# Patient Record
Sex: Male | Born: 1960
Health system: Southern US, Community
[De-identification: ages and names within clinical notes are randomized; demographics above are authoritative.]

## PROBLEM LIST (undated history)

## (undated) DIAGNOSIS — I4819 Other persistent atrial fibrillation: Secondary | ICD-10-CM

## (undated) DIAGNOSIS — C439 Malignant melanoma of skin, unspecified: Secondary | ICD-10-CM

## (undated) DIAGNOSIS — I4891 Unspecified atrial fibrillation: Secondary | ICD-10-CM

## (undated) DIAGNOSIS — I4892 Unspecified atrial flutter: Secondary | ICD-10-CM

## (undated) DIAGNOSIS — I10 Essential (primary) hypertension: Secondary | ICD-10-CM

## (undated) DIAGNOSIS — D229 Melanocytic nevi, unspecified: Secondary | ICD-10-CM

## (undated) HISTORY — PX: KNEE ARTHROSCOPY: SHX127

## (undated) HISTORY — DX: Other persistent atrial fibrillation: I48.19

---
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
## 2006-05-11 ENCOUNTER — Ambulatory Visit (HOSPITAL_BASED_OUTPATIENT_CLINIC_OR_DEPARTMENT_OTHER): Admission: RE | Admit: 2006-05-11 | Discharge: 2006-05-11 | Payer: Self-pay | Admitting: Orthopedic Surgery

## 2011-09-29 ENCOUNTER — Other Ambulatory Visit: Payer: Self-pay

## 2011-09-29 ENCOUNTER — Encounter (HOSPITAL_COMMUNITY): Payer: Self-pay | Admitting: Emergency Medicine

## 2011-09-29 ENCOUNTER — Emergency Department (HOSPITAL_COMMUNITY): Payer: BC Managed Care – PPO

## 2011-09-29 ENCOUNTER — Emergency Department (HOSPITAL_COMMUNITY)
Admission: EM | Admit: 2011-09-29 | Discharge: 2011-09-29 | Disposition: A | Discharge status: Home / Self Care | Payer: BC Managed Care – PPO | Attending: Emergency Medicine | Admitting: Emergency Medicine

## 2011-09-29 DIAGNOSIS — R0602 Shortness of breath: Secondary | ICD-10-CM | POA: Insufficient documentation

## 2011-09-29 DIAGNOSIS — R079 Chest pain, unspecified: Secondary | ICD-10-CM | POA: Insufficient documentation

## 2011-09-29 HISTORY — DX: Malignant melanoma of skin, unspecified: C43.9

## 2011-09-29 LAB — BASIC METABOLIC PANEL
BUN: 12 mg/dL (ref 6–23)
CO2: 26 mEq/L (ref 19–32)
Calcium: 9.4 mg/dL (ref 8.4–10.5)
Chloride: 104 mEq/L (ref 96–112)
Creatinine, Ser: 1.09 mg/dL (ref 0.50–1.35)
Glucose, Bld: 107 mg/dL — ABNORMAL HIGH (ref 70–99)

## 2011-09-29 LAB — CBC
HCT: 41.7 % (ref 39.0–52.0)
Hemoglobin: 14.4 g/dL (ref 13.0–17.0)
MCV: 87.8 fL (ref 78.0–100.0)
RDW: 12.7 % (ref 11.5–15.5)
WBC: 6.4 10*3/uL (ref 4.0–10.5)

## 2011-09-29 LAB — DIFFERENTIAL
Basophils Absolute: 0 10*3/uL (ref 0.0–0.1)
Eosinophils Relative: 2 % (ref 0–5)
Lymphocytes Relative: 34 % (ref 12–46)
Lymphs Abs: 2.2 10*3/uL (ref 0.7–4.0)
Monocytes Absolute: 0.8 10*3/uL (ref 0.1–1.0)
Monocytes Relative: 13 % — ABNORMAL HIGH (ref 3–12)

## 2011-09-29 MED ORDER — GI COCKTAIL ~~LOC~~
30.0000 mL | Freq: Once | ORAL | Status: AC
  Administered 2011-09-29: 30 mL via ORAL
  Filled 2011-09-29: qty 30

## 2011-09-29 MED ORDER — OMEPRAZOLE 20 MG PO CPDR: 40.0000 mg | DELAYED_RELEASE_CAPSULE | Freq: Every day | ORAL | Status: DC

## 2011-09-29 MED ORDER — ASPIRIN 81 MG PO CHEW
324.0000 mg | CHEWABLE_TABLET | Freq: Once | ORAL | Status: AC
  Administered 2011-09-29: 324 mg via ORAL
  Filled 2011-09-29: qty 4

## 2011-09-29 NOTE — ED Notes (Signed)
Pt states been having chest pain, shortness of breath, and nausea for about 2 months, the pain has been more constant the past 2 weeks, pt describes pain as episodes of sharp, stabbing pain with a constant pressure, with squeezing pain radiating to left arm. Wife called PMD at [facility] to schedule appointment for chest pain, RN at office instructed to come to ED.

## 2011-09-29 NOTE — ED Provider Notes (Signed)
History     CSN: [id number]  Arrival date & time 09/29/11  1257   First MD Initiated Contact with Patient 09/29/11 1311      Chief Complaint  Patient presents with  . Chest Pain  . Shortness of Breath    (Consider location/radiation/quality/duration/timing/severity/associated sxs/prior treatment) HPI  Patient presents to ER complaining of a one month hx of CP. Patient states that over the last 2 weeks he has been having daily constant pressure in the left side of chest that 'feels like there is a 5-10pound weight on my chest" that he states he goes to bed with pain and wakes with pain stating that the intensity of pressure sometimes increases and then wanes back to a constant dull pressure. Patient notes that intermittently he will have sharp stabbing pain into left chest that is "more intense" then constant pressure with associated nausea and SOB. "when I get the sharp pain it is like the breath is knocked out of me." Patient states he will take an aspirin when the pain becomes more severe but takes no medications on a regular basis. Patient states he called his PCP today to "finally get seen for this but when I told them I was having CP they told me I needed to go to ER." Patient states PCP is Dr. Steele Berg in [place], [place]. Patient states he has been told in the past that he has high cholesterol but that PCP has him on no daily medications. Patient states that he works out regularly at gym. He denies tobacco, alcohol or illicit drug use. Patient states that both his mother and father had early heart disease and that his father "ended up dying because he needed a heart transplant because his heart got so back and mother has had lots of stents and bypass surgery" noting that heart disease started in her early 55s. Patient denies aggravating or alleviating factors.   Past Medical History  Diagnosis Date  . Melanoma     Past Surgical History  Procedure Date  . Knee arthroscopy     History  reviewed. No pertinent family history.  History  Substance Use Topics  . Smoking status: Never Smoker   . Smokeless tobacco: Never Used  . Alcohol Use: No      Review of Systems  All other systems reviewed and are negative.    Allergies  Review of patient's allergies indicates no known allergies.  Home Medications   Current Outpatient Rx  Name Route Sig Dispense Refill  . IBUPROFEN 200 MG PO TABS Oral Take 600 mg by mouth every 6 (six) hours as needed. pain    . MULTIVITAMINS PO TABS Oral Take 1 tablet by mouth daily.      BP 150/96  Pulse 55  Temp(Src) 97.7 F (36.5 C) (Oral)  Resp 13  SpO2 97%  Physical Exam  Nursing note and vitals reviewed. Constitutional: He is oriented to person, place, and time. He appears well-developed and well-nourished. No distress.  HENT:  Head: Normocephalic and atraumatic.  Eyes: Conjunctivae are normal.  Neck: Normal range of motion. Neck supple.  Cardiovascular: Normal rate, regular rhythm, normal heart sounds and intact distal pulses.  Exam reveals no gallop and no friction rub.   No murmur heard. Pulmonary/Chest: Effort normal and breath sounds normal. No respiratory distress. He has no wheezes. He has no rales. He exhibits no tenderness.  Abdominal: Soft. Bowel sounds are normal. He exhibits no distension and no mass. There is no tenderness. There is  no rebound and no guarding.  Musculoskeletal: Normal range of motion. He exhibits no edema and no tenderness.  Neurological: He is alert and oriented to person, place, and time.  Skin: Skin is warm and dry. No rash noted. He is not diaphoretic. No erythema.  Psychiatric: He has a normal mood and affect.    ED Course  Procedures (including critical care time)  GI cocktail and PO ASA   Date: 09/29/2011  Rate: 54  Rhythm: normal sinus rhythm  QRS Axis: normal  Intervals: normal  ST/T Wave abnormalities: normal  Conduction Disutrbances: none  Narrative Interpretation: non  provocative EKG  Old EKG Reviewed: compared to prior, No significant changes noted   Labs Reviewed  DIFFERENTIAL - Abnormal; Notable for the following:    Monocytes Relative 13 (*)    All other components within normal limits  BASIC METABOLIC PANEL - Abnormal; Notable for the following:    Glucose, Bld 107 (*)    GFR calc non Af Amer 77 (*)    GFR calc Af Amer 89 (*)    All other components within normal limits  CBC  D-DIMER, QUANTITATIVE  POCT I-STAT TROPONIN I  TROPONIN I   Dg Chest 2 View  09/29/2011  *RADIOLOGY REPORT*  Clinical Data: Chest pain and pressure.  CHEST - 2 VIEW  Comparison: None.  Findings: The lungs are clear without focal infiltrate, edema, pneumothorax or pleural effusion. Cardiopericardial silhouette is at upper limits of normal for size. Imaged bony structures of the thorax are intact.  IMPRESSION: No acute cardiopulmonary findings.  Original Report Authenticated By: ERIC A. MANSELL, M.D.     1. Chest pain       MDM  Patient's constant pain/pressure in chest x 2 weeks is not typical for ACS with negative troponin and EKG with constant pain. Low risk PE and ddimer negative. Patient does have some family hx risk of early CAD but carries very little personal risk for CAD. He is agreeable to following up with PCP and cardiology for further evaluation and management of CP but returning to ER for changing or worsening of symptoms.         Jenness Corner, [place] 09/29/11 1709

## 2011-09-29 NOTE — ED Provider Notes (Signed)
Medical screening examination/treatment/procedure(s) were conducted as a shared visit with non-physician practitioner(s) and myself.  I personally evaluated the patient during the encounter  Constant chest "pressure" x 1 month that varies in intensity but is always somewhat present.  Occasional sharp stabbing pain that last about 1 minute and is associate with SOB.  No cardiac history.  Pain is worse with L arm movement. EKG nonischemic. Troponin and Ddimer neg. Atypical for angina. Stable for outpatient stress.  Glynn Octave, MD 09/29/11 2000

## 2011-10-13 DIAGNOSIS — I10 Essential (primary) hypertension: Secondary | ICD-10-CM | POA: Insufficient documentation

## 2015-04-03 DIAGNOSIS — R7303 Prediabetes: Secondary | ICD-10-CM | POA: Insufficient documentation

## 2016-04-16 DIAGNOSIS — D229 Melanocytic nevi, unspecified: Secondary | ICD-10-CM

## 2016-04-16 HISTORY — DX: Melanocytic nevi, unspecified: D22.9

## 2017-01-16 ENCOUNTER — Encounter (INDEPENDENT_AMBULATORY_CARE_PROVIDER_SITE_OTHER): Payer: 59 | Admitting: Ophthalmology

## 2017-01-16 DIAGNOSIS — H353112 Nonexudative age-related macular degeneration, right eye, intermediate dry stage: Secondary | ICD-10-CM

## 2017-01-16 DIAGNOSIS — H35033 Hypertensive retinopathy, bilateral: Secondary | ICD-10-CM

## 2017-01-16 DIAGNOSIS — I10 Essential (primary) hypertension: Secondary | ICD-10-CM | POA: Diagnosis not present

## 2017-01-16 DIAGNOSIS — H43813 Vitreous degeneration, bilateral: Secondary | ICD-10-CM | POA: Diagnosis not present

## 2017-01-16 DIAGNOSIS — D3132 Benign neoplasm of left choroid: Secondary | ICD-10-CM | POA: Diagnosis not present

## 2017-01-27 ENCOUNTER — Emergency Department (HOSPITAL_COMMUNITY): Payer: 59

## 2017-01-27 ENCOUNTER — Emergency Department (HOSPITAL_COMMUNITY)
Admission: EM | Admit: 2017-01-27 | Discharge: 2017-01-27 | Disposition: A | Discharge status: Home / Self Care | Payer: 59 | Attending: Emergency Medicine | Admitting: Emergency Medicine

## 2017-01-27 ENCOUNTER — Encounter (HOSPITAL_COMMUNITY): Payer: Self-pay | Admitting: Emergency Medicine

## 2017-01-27 DIAGNOSIS — Z85820 Personal history of malignant melanoma of skin: Secondary | ICD-10-CM | POA: Insufficient documentation

## 2017-01-27 DIAGNOSIS — Z79899 Other long term (current) drug therapy: Secondary | ICD-10-CM | POA: Diagnosis not present

## 2017-01-27 DIAGNOSIS — R0602 Shortness of breath: Secondary | ICD-10-CM | POA: Diagnosis not present

## 2017-01-27 DIAGNOSIS — R072 Precordial pain: Secondary | ICD-10-CM | POA: Diagnosis not present

## 2017-01-27 LAB — BRAIN NATRIURETIC PEPTIDE: B Natriuretic Peptide: 18.2 pg/mL (ref 0.0–100.0)

## 2017-01-27 LAB — I-STAT TROPONIN, ED
TROPONIN I, POC: 0.02 ng/mL (ref 0.00–0.08)
Troponin i, poc: 0.02 ng/mL (ref 0.00–0.08)

## 2017-01-27 LAB — BASIC METABOLIC PANEL
ANION GAP: 6 (ref 5–15)
BUN: 12 mg/dL (ref 6–20)
CO2: 26 mmol/L (ref 22–32)
Calcium: 9 mg/dL (ref 8.9–10.3)
Chloride: 105 mmol/L (ref 101–111)
Creatinine, Ser: 1.23 mg/dL (ref 0.61–1.24)
GFR calc Af Amer: >60 mL/min (ref >60)
GLUCOSE: 130 mg/dL — AB (ref 65–99)
POTASSIUM: 4 mmol/L (ref 3.5–5.1)
Sodium: 137 mmol/L (ref 135–145)

## 2017-01-27 LAB — CBC
HEMATOCRIT: 50.4 % (ref 39.0–52.0)
HEMOGLOBIN: 17.2 g/dL — AB (ref 13.0–17.0)
MCH: 31.4 pg (ref 26.0–34.0)
MCHC: 34.1 g/dL (ref 30.0–36.0)
MCV: 92 fL (ref 78.0–100.0)
Platelets: 232 10*3/uL (ref 150–400)
RBC: 5.48 MIL/uL (ref 4.22–5.81)
RDW: 13.4 % (ref 11.5–15.5)
WBC: 7.2 10*3/uL (ref 4.0–10.5)

## 2017-01-27 LAB — D-DIMER, QUANTITATIVE (NOT AT ARMC)

## 2017-01-27 MED ORDER — IPRATROPIUM-ALBUTEROL 0.5-2.5 (3) MG/3ML IN SOLN
3.0000 mL | Freq: Once | RESPIRATORY_TRACT | Status: AC
  Administered 2017-01-27: 3 mL via RESPIRATORY_TRACT
  Filled 2017-01-27: qty 3

## 2017-01-27 MED ORDER — ACETAMINOPHEN 500 MG PO TABS
1000.0000 mg | ORAL_TABLET | Freq: Once | ORAL | Status: AC
  Administered 2017-01-27: 1000 mg via ORAL
  Filled 2017-01-27: qty 2

## 2017-01-27 MED ORDER — ALBUTEROL SULFATE HFA 108 (90 BASE) MCG/ACT IN AERS
2.0000 | INHALATION_SPRAY | Freq: Once | RESPIRATORY_TRACT | Status: AC
  Administered 2017-01-27: 2 via RESPIRATORY_TRACT
  Filled 2017-01-27: qty 6.7

## 2017-01-27 MED ORDER — GI COCKTAIL ~~LOC~~
30.0000 mL | Freq: Once | ORAL | Status: AC
  Administered 2017-01-27: 30 mL via ORAL
  Filled 2017-01-27: qty 30

## 2017-01-27 MED ORDER — ASPIRIN 81 MG PO CHEW
81.0000 mg | CHEWABLE_TABLET | Freq: Once | ORAL | Status: AC
  Administered 2017-01-27: 81 mg via ORAL
  Filled 2017-01-27: qty 1

## 2017-01-27 NOTE — ED Provider Notes (Signed)
[facility] DEPT Provider Note   CSN: [id number] Arrival date & time: 01/27/17  1218     History   Chief Complaint Chief Complaint  Patient presents with  . Shortness of Breath  . Chest Pain    HPI Stephanos Fan is a 56 y.o. male with history of hypertension presents to the ED with chest heaviness and shortness of breath on exertion 2 weeks. Intermittently CP radiates to middle upper back. Shortness of breath has now progressed and is also occurring at rest. He was sitting in class today when he felt winded and SOB. Shortness of breath is exacerbated by laying flat on back and sleeping, patient has had to sit up on chair or sleep on his side. Occasional, intermittent nonproductive cough. Patient has taken nitroglycerin for chest discomfort and shortness of breath with temporary relief of his symptoms. He was initially evaluated at urgent care 2-3 weeks ago who referred him to cardiology, patient had a stress test and echocardiogram done last week by Dr. Nadyne Coombes. Patient reports Dr. Nadyne Coombes told him he is mild/moderate risk and that his stress test "wasn't great". Patient does not know about his echocardiogram results. No known h/o CAD, PE/DVT. No personal h/o DM, HLD, tobacco use, illicit drug use, young onset family CAD.   HPI  Past Medical History:  Diagnosis Date  . Melanoma ([place])     There are no active problems to display for this patient.   Past Surgical History:  Procedure Laterality Date  . KNEE ARTHROSCOPY         Home Medications    Prior to Admission medications   Medication Sig Start Date End Date Taking? Authorizing Provider  amLODipine (NORVASC) 5 MG tablet Take 5 mg by mouth daily. 12/31/16  Yes [provider]  ibuprofen (ADVIL,MOTRIN) 200 MG tablet Take 600 mg by mouth every 6 (six) hours as needed for mild pain. pain    Yes [provider]  losartan (COZAAR) 50 MG tablet Take 50 mg by mouth daily. 01/20/17  Yes [provider]    multivitamin (THERAGRAN) per tablet Take 1 tablet by mouth daily.   Yes [provider]  nitroGLYCERIN (NITROSTAT) 0.4 MG SL tablet Place 0.4 mg under the tongue every 5 (five) minutes as needed for chest pain. 01/23/17  Yes [provider]  testosterone cypionate (DEPOTESTOSTERONE CYPIONATE) 200 MG/ML injection Inject 3 mLs into the muscle every 14 (fourteen) days. 01/07/17  Yes [provider]    Family History No family history on file.  Social History Social History  Substance Use Topics  . Smoking status: Never Smoker  . Smokeless tobacco: Never Used  . Alcohol use No     Allergies   Doxycycline and Tetracycline   Review of Systems Review of Systems  Constitutional: Negative for diaphoresis and fever.  HENT: Negative for sore throat.   Respiratory: Positive for cough, chest tightness and shortness of breath.   Cardiovascular: Positive for chest pain. Negative for palpitations.  Gastrointestinal: Negative for abdominal pain, constipation, diarrhea, nausea and vomiting.  Genitourinary: Negative for difficulty urinating, dysuria, frequency and hematuria.  Musculoskeletal: Positive for back pain. Negative for myalgias.  Skin: Negative for rash.  Neurological: Negative for syncope, weakness, light-headedness, numbness and headaches.     Physical Exam Updated Vital Signs BP (!) 156/87   Pulse 78   Temp 98.2 F (36.8 C) (Oral)   Resp 15   Ht 6' (1.829 m)   Wt 111.1 kg (245 lb)   SpO2  95%   BMI 33.23 kg/m   Physical Exam  Constitutional: He is oriented to person, place, and time. He appears well-developed and well-nourished. No distress.  HENT:  Head: Normocephalic and atraumatic.  Nose: Nose normal.  Mouth/Throat: Oropharynx is clear and moist. No oropharyngeal exudate.  Eyes: Conjunctivae and EOM are normal. Pupils are equal, round, and reactive to light.  Neck: Normal range of motion. Neck supple.  Cardiovascular: Normal rate, regular  rhythm, normal heart sounds and intact distal pulses.   No murmur heard. No S3. No JVD. No LE edema.   Pulmonary/Chest: Effort normal and breath sounds normal. No respiratory distress. He has no wheezes. He has no rales. He exhibits no tenderness.  Abdominal: Soft. Bowel sounds are normal. He exhibits no distension. There is no tenderness.  Musculoskeletal: Normal range of motion. He exhibits no deformity.  Lymphadenopathy:    He has no cervical adenopathy.  Neurological: He is alert and oriented to person, place, and time.  Skin: Skin is warm and dry. Capillary refill takes less than 2 seconds.  Psychiatric: He has a normal mood and affect. His behavior is normal. Judgment and thought content normal.  Nursing note and vitals reviewed.    ED Treatments / Results  Labs (all labs ordered are listed, but only abnormal results are displayed) Labs Reviewed  BASIC METABOLIC PANEL - Abnormal; Notable for the following:       Result Value   Glucose, Bld 130 (*)    All other components within normal limits  CBC - Abnormal; Notable for the following:    Hemoglobin 17.2 (*)    All other components within normal limits  BRAIN NATRIURETIC PEPTIDE  D-DIMER, QUANTITATIVE (NOT AT [facility])  I-STAT TROPOININ, ED    EKG  EKG Interpretation  Date/Time:  Tuesday January 27 2017 12:24:32 EDT Ventricular Rate:  73 PR Interval:  146 QRS Duration: 92 QT Interval:  390 QTC Calculation: 429 R Axis:   52 Text Interpretation:  Normal sinus rhythm Normal ECG Confirmed by Veryl Speak [phone]) on 01/27/2017 2:37:49 PM       Radiology Dg Chest 2 View  Result Date: 01/27/2017 CLINICAL DATA:  Chest pain, shortness of breath for several weeks, abnormal stress test EXAM: CHEST  2 VIEW COMPARISON:  Chest x-ray of 09/29/2011 FINDINGS: No active infiltrate or effusion is seen. Mediastinal and hilar contours are unremarkable and heart size is stable. No bony abnormality is seen. IMPRESSION: No active  cardiopulmonary disease. Electronically Signed   By: Ivar Drape M.D.   On: 01/27/2017 12:50    Procedures Procedures (including critical care time)  Medications Ordered in ED Medications  gi cocktail (Maalox,Lidocaine,Donnatal) (not administered)  acetaminophen (TYLENOL) tablet 1,000 mg (not administered)  aspirin chewable tablet 81 mg (not administered)  ipratropium-albuterol (DUONEB) 0.5-2.5 (3) MG/3ML nebulizer solution 3 mL (3 mLs Nebulization Given 01/27/17 1548)     Initial Impression / Assessment and Plan / ED Course  I have reviewed the triage vital signs and the nursing notes.  Pertinent labs & imaging results that were available during my care of the patient were reviewed by me and considered in my medical decision making (see chart for details).    Pt is a 56 y.o. male presents with chest tightness associated with SOB. SOB worse on exertion and laying flat.  Chest tightness now occurs on exertion but now also at rest.  Pertinent risk factors include HTN and obesity.  On exam VS are wnl. Cardiovascular and pulmonary exam benign.  No LE edema, no S3. CXR with no significant cardiomegaly or pulmonary edema.  EKG and trop negative.  CBC and BMP unremarkable.  Heart score = 3-4 with obesity, HTN and mod/high suspicious history.  Pt was given duoneb, GI cocktail, tylenol in ED. Cardiologist is Dr. Nadyne Coombes, who saw patient last week and did a stress test and echocardiogram. Patient states his stress test was "not great" but unsure of echocardiogram results. Cannot find documentation of these tests on chart.   Symptoms suspicious for MSK vs unstable angina. Considering but low suspicion for PE and dissection. Will page Dr. Nadyne Coombes for recommendations.  Final Clinical Impressions(s) / ED Diagnoses   Final diagnoses:  Shortness of breath  Precordial pain   345PM: Spoke to Dr. Nadyne Coombes. He reviewed patient's chart and stress test done on 01/24/17. Stress test was normal. Patient has minimal risk  factors per Dr. Nadyne Coombes  Who suspect MSK etiology given non ischemic EKG and troponin today.  He recommends GI cocktail, tylenol, delta trop and r/o PE.   Patient handed off to oncoming PA-C Dansie at shift change pending BNP, d-dimer, delta trop and reassessment. Suspect discharge with f/u with Dr. Nadyne Coombes later this week.   New Prescriptions New Prescriptions   No medications on file       Arlean Hopping 01/27/17 1558    Veryl Speak, MD 01/28/17 [phone]

## 2017-01-27 NOTE — ED Notes (Signed)
Patient states he has been seeing cardiology for several weeks for chest pain states he had a stress test on Fri and "failed" scheduled for cardiolyte study July 30th. States he was having chest heaviness with back pain and sob today while at work. Took 1 ntg with relief. However 15-20 minutes later started feeling worse. States the pain and sob is worse laying down. States he feels better sitting up.

## 2017-01-27 NOTE — ED Triage Notes (Signed)
Pt. Stated, I started having chest pain with SOB for a few weeks, I failed the stress test. This episodes started this morning.

## 2017-01-27 NOTE — ED Notes (Signed)
Patient left at this time with all belongings. 

## 2017-01-27 NOTE — ED Provider Notes (Signed)
Patient care assumed from Carmon Sails, PA-C at shift change. Please see her note for further.  Briefly, patient presented with chest pain and shortness of breath with chest heaviness intermittently for 2 weeks. Patient's initial blood work was reassuring. Initial troponin is not elevated. Plan at shift change was for BNP, d-dimer and delta troponin. Previous provider consult with cardiologist Dr. Einar Gip who reports if patient's BNP, d-dimer and delta troponin are not elevated patient can likely be discharged with follow-up in his office. He had a recent cardiac stress test that was 'ot great.' but he did not feel the patient needed admission and could follow up as an outpatient.  Patient's delta troponin is not elevated. BNP and d-dimer are not elevated. At my evaluation patient reports he is feeling better. He did report feeling better after breathing treatment and aspirin. I will provide him with an albuterol inhaler in case this is related to some wheezing. I encouraged him to follow closely with cardiology this week. Patient agrees. Discussed strict and specific return precautions. I advised the patient to follow-up with their primary care provider this week. I advised the patient to return to the emergency department with new or worsening symptoms or new concerns. The patient verbalized understanding and agreement with plan.   Results for orders placed or performed during the hospital encounter of 83/38/25  Basic metabolic panel  Result Value Ref Range   Sodium 137 135 - 145 mmol/L   Potassium 4.0 3.5 - 5.1 mmol/L   Chloride 105 101 - 111 mmol/L   CO2 26 22 - 32 mmol/L   Glucose, Bld 130 (H) 65 - 99 mg/dL   BUN 12 6 - 20 mg/dL   Creatinine, Ser 1.23 0.61 - 1.24 mg/dL   Calcium 9.0 8.9 - 10.3 mg/dL   GFR calc non Af Amer >60 >60 mL/min   GFR calc Af Amer >60 >60 mL/min   Anion gap 6 5 - 15  CBC  Result Value Ref Range   WBC 7.2 4.0 - 10.5 K/uL   RBC 5.48 4.22 - 5.81 MIL/uL   Hemoglobin  17.2 (H) 13.0 - 17.0 g/dL   HCT 50.4 39.0 - 52.0 %   MCV 92.0 78.0 - 100.0 fL   MCH 31.4 26.0 - 34.0 pg   MCHC 34.1 30.0 - 36.0 g/dL   RDW 13.4 11.5 - 15.5 %   Platelets 232 150 - 400 K/uL  Brain natriuretic peptide  Result Value Ref Range   B Natriuretic Peptide 18.2 0.0 - 100.0 pg/mL  D-dimer, quantitative (not at [facility])  Result Value Ref Range   D-Dimer, Quant <0.27 0.00 - 0.50 ug/mL-FEU  I-stat troponin, ED  Result Value Ref Range   Troponin i, poc 0.02 0.00 - 0.08 ng/mL   Comment 3          I-stat troponin, ED  Result Value Ref Range   Troponin i, poc 0.02 0.00 - 0.08 ng/mL   Comment 3           Dg Chest 2 View  Result Date: 01/27/2017 CLINICAL DATA:  Chest pain, shortness of breath for several weeks, abnormal stress test EXAM: CHEST  2 VIEW COMPARISON:  Chest x-ray of 09/29/2011 FINDINGS: No active infiltrate or effusion is seen. Mediastinal and hilar contours are unremarkable and heart size is stable. No bony abnormality is seen. IMPRESSION: No active cardiopulmonary disease. Electronically Signed   By: Ivar Drape M.D.   On: 01/27/2017 12:50    Shortness of breath  Precordial pain     Waynetta Pean, PA-C 01/27/17 2001    Veryl Speak, MD 01/28/17 [phone]

## 2017-07-17 ENCOUNTER — Telehealth: Payer: Self-pay | Admitting: Hematology and Oncology

## 2017-07-17 NOTE — Telephone Encounter (Signed)
Spoke with patient re 1/18 new patient appointment with Dr. Lebron Conners at 1pm. Patient given appointment date/time/location. Demographic/insurance confirmed. Left message for referring office confirming appointment date/time.

## 2017-07-29 DIAGNOSIS — L03213 Periorbital cellulitis: Secondary | ICD-10-CM | POA: Diagnosis not present

## 2017-07-29 DIAGNOSIS — H109 Unspecified conjunctivitis: Secondary | ICD-10-CM | POA: Diagnosis not present

## 2017-08-07 ENCOUNTER — Telehealth: Payer: Self-pay | Admitting: Hematology and Oncology

## 2017-08-07 ENCOUNTER — Encounter: Payer: Self-pay | Admitting: Hematology and Oncology

## 2017-08-07 ENCOUNTER — Inpatient Hospital Stay: Payer: 59 | Attending: Hematology and Oncology | Admitting: Hematology and Oncology

## 2017-08-07 ENCOUNTER — Inpatient Hospital Stay: Payer: 59

## 2017-08-07 VITALS — BP 133/87 | HR 58 | Temp 98.5°F | Resp 20 | Wt 243.1 lb

## 2017-08-07 DIAGNOSIS — R0609 Other forms of dyspnea: Secondary | ICD-10-CM

## 2017-08-07 DIAGNOSIS — Z803 Family history of malignant neoplasm of breast: Secondary | ICD-10-CM | POA: Diagnosis not present

## 2017-08-07 DIAGNOSIS — I10 Essential (primary) hypertension: Secondary | ICD-10-CM

## 2017-08-07 DIAGNOSIS — Z85820 Personal history of malignant melanoma of skin: Secondary | ICD-10-CM | POA: Diagnosis not present

## 2017-08-07 DIAGNOSIS — D751 Secondary polycythemia: Secondary | ICD-10-CM

## 2017-08-07 DIAGNOSIS — R202 Paresthesia of skin: Secondary | ICD-10-CM

## 2017-08-07 LAB — CMP (CANCER CENTER ONLY)
ALT: 51 U/L (ref 0–55)
AST: 38 U/L — AB (ref 5–34)
Albumin: 3.9 g/dL (ref 3.5–5.0)
Alkaline Phosphatase: 55 U/L (ref 40–150)
Anion gap: 8 (ref 3–11)
BILIRUBIN TOTAL: 0.8 mg/dL (ref 0.2–1.2)
BUN: 15 mg/dL (ref 7–26)
CO2: 28 mmol/L (ref 22–29)
Calcium: 9.1 mg/dL (ref 8.4–10.4)
Chloride: 104 mmol/L (ref 98–109)
Creatinine: 1.34 mg/dL — ABNORMAL HIGH (ref 0.70–1.30)
GFR, Estimated: 57 mL/min — ABNORMAL LOW (ref >60)
Glucose, Bld: 87 mg/dL (ref 70–140)
POTASSIUM: 4.7 mmol/L (ref 3.5–5.1)
Sodium: 140 mmol/L (ref 136–145)
TOTAL PROTEIN: 7.5 g/dL (ref 6.4–8.3)

## 2017-08-07 LAB — CBC WITH DIFFERENTIAL (CANCER CENTER ONLY)
BASOS ABS: 0 10*3/uL (ref 0.0–0.1)
Basophils Relative: 1 %
EOS PCT: 2 %
Eosinophils Absolute: 0.1 10*3/uL (ref 0.0–0.5)
HEMATOCRIT: 51 % — AB (ref 38.4–49.9)
HEMOGLOBIN: 17.3 g/dL — AB (ref 13.0–17.1)
LYMPHS ABS: 2 10*3/uL (ref 0.9–3.3)
LYMPHS PCT: 30 %
MCH: 31.6 pg (ref 27.2–33.4)
MCHC: 33.9 g/dL (ref 32.0–36.0)
MCV: 93.1 fL (ref 79.3–98.0)
Monocytes Absolute: 1 10*3/uL — ABNORMAL HIGH (ref 0.1–0.9)
Monocytes Relative: 15 %
NEUTROS ABS: 3.5 10*3/uL (ref 1.5–6.5)
NEUTROS PCT: 52 %
PLATELETS: 218 10*3/uL (ref 140–400)
RBC: 5.48 MIL/uL (ref 4.20–5.82)
RDW: 13.5 % (ref 11.0–15.6)
WBC: 6.7 10*3/uL (ref 4.0–10.3)

## 2017-08-07 NOTE — Telephone Encounter (Signed)
Gave patient avs and calendar with appts per 11/8 los.  °

## 2017-08-08 LAB — ERYTHROPOIETIN: Erythropoietin: 9.2 m[IU]/mL (ref 2.6–18.5)

## 2017-08-12 ENCOUNTER — Encounter (HOSPITAL_COMMUNITY): Payer: Self-pay | Admitting: Emergency Medicine

## 2017-08-12 ENCOUNTER — Emergency Department (HOSPITAL_COMMUNITY): Payer: 59

## 2017-08-12 ENCOUNTER — Other Ambulatory Visit: Payer: Self-pay

## 2017-08-12 DIAGNOSIS — R0789 Other chest pain: Secondary | ICD-10-CM | POA: Diagnosis not present

## 2017-08-12 DIAGNOSIS — Z85820 Personal history of malignant melanoma of skin: Secondary | ICD-10-CM | POA: Diagnosis not present

## 2017-08-12 DIAGNOSIS — R0602 Shortness of breath: Secondary | ICD-10-CM | POA: Insufficient documentation

## 2017-08-12 DIAGNOSIS — Z79899 Other long term (current) drug therapy: Secondary | ICD-10-CM | POA: Diagnosis not present

## 2017-08-12 LAB — CBC
HEMATOCRIT: 49.3 % (ref 39.0–52.0)
Hemoglobin: 17 g/dL (ref 13.0–17.0)
MCH: 32 pg (ref 26.0–34.0)
MCHC: 34.5 g/dL (ref 30.0–36.0)
MCV: 92.8 fL (ref 78.0–100.0)
PLATELETS: 254 10*3/uL (ref 150–400)
RBC: 5.31 MIL/uL (ref 4.22–5.81)
RDW: 13.3 % (ref 11.5–15.5)
WBC: 7.8 10*3/uL (ref 4.0–10.5)

## 2017-08-12 LAB — BASIC METABOLIC PANEL
Anion gap: 9 (ref 5–15)
BUN: 14 mg/dL (ref 6–20)
CHLORIDE: 109 mmol/L (ref 101–111)
CO2: 24 mmol/L (ref 22–32)
CREATININE: 1.42 mg/dL — AB (ref 0.61–1.24)
Calcium: 9.1 mg/dL (ref 8.9–10.3)
GFR calc non Af Amer: 53 mL/min — ABNORMAL LOW (ref >60)
Glucose, Bld: 114 mg/dL — ABNORMAL HIGH (ref 65–99)
POTASSIUM: 4.6 mmol/L (ref 3.5–5.1)
Sodium: 142 mmol/L (ref 135–145)

## 2017-08-12 LAB — I-STAT TROPONIN, ED: Troponin i, poc: 0.01 ng/mL (ref 0.00–0.08)

## 2017-08-12 NOTE — ED Notes (Signed)
Pt states he is being seen by an oncologist for "too many red blood cells-- also have seen a pulmonologist/hematologist -- for polycythemia vera"

## 2017-08-12 NOTE — ED Triage Notes (Signed)
Pt. Stated, ive been SOb off and on fro about 6 months.  It comes and goes. Its probably because my body is producing too many RBC.  Its causing it to be sluggish.

## 2017-08-12 NOTE — ED Notes (Signed)
Lab work, radiology results and vital signs reviewed, no critical results at this time, no change in acuity indicated.  

## 2017-08-13 ENCOUNTER — Emergency Department (HOSPITAL_COMMUNITY)
Admission: EM | Admit: 2017-08-13 | Discharge: 2017-08-13 | Disposition: A | Discharge status: Home / Self Care | Payer: 59 | Attending: Emergency Medicine | Admitting: Emergency Medicine

## 2017-08-13 DIAGNOSIS — R0602 Shortness of breath: Secondary | ICD-10-CM

## 2017-08-13 NOTE — ED Notes (Signed)
Patient denies pain and is resting comfortably.  

## 2017-08-13 NOTE — ED Provider Notes (Signed)
TIME SEEN: 1:00 AM  CHIEF COMPLAINT: Shortness of breath  HPI: Patient is a 57 year old male with history of hypertension who presents to the emergency department with shortness of breath.  Symptoms have been ongoing and intermittent for 6 months.  States he came to the emergency department today because he had 2 episodes back to back which is abnormal for him.  Symptoms came on while at rest while driving.  He states that when this happens he feels very anxious and has chest tightness.  States he has been seen by Dr. Einar Gip his cardiologist and has had an outpatient stress test and echocardiogram which were normal.  He is also been seen by hematology and it is thought that he may have polycythemia vera.  He has follow-up scheduled tomorrow.  Was here in July 2018 at that time had a negative workup including negative d-dimer.  No lower extremity swelling or pain.  No history of CHF, PE or DVT, CAD.  He has never had a cardiac catheterization.  Symptoms are not pleuritic or exertional.  No fevers or cough.  He is currently asymptomatic.  ROS: See HPI Constitutional: no fever  Eyes: no drainage  ENT: no runny nose   Cardiovascular:   chest pain  Resp:  SOB  GI: no vomiting GU: no dysuria Integumentary: no rash  Allergy: no hives  Musculoskeletal: no leg swelling  Neurological: no slurred speech ROS otherwise negative  PAST MEDICAL HISTORY/PAST SURGICAL HISTORY:  Past Medical History:  Diagnosis Date  . Melanoma ([place])     MEDICATIONS:  Prior to Admission medications   Medication Sig Start Date End Date Taking? Authorizing Provider  amLODipine (NORVASC) 5 MG tablet Take 5 mg by mouth daily. 12/31/16   [provider]  losartan (COZAAR) 50 MG tablet Take 50 mg by mouth daily. 01/20/17   [provider]  multivitamin [facility]) per tablet Take 1 tablet by mouth daily.    [provider]  testosterone cypionate (DEPOTESTOSTERONE CYPIONATE) 200 MG/ML injection Inject 3  mLs into the muscle every 14 (fourteen) days. 01/07/17   [provider]    ALLERGIES:  Allergies  Allergen Reactions  . Doxycycline Swelling  . Tetracycline Rash    SOCIAL HISTORY:  Social History   Tobacco Use  . Smoking status: Never Smoker  . Smokeless tobacco: Never Used  Substance Use Topics  . Alcohol use: No    FAMILY HISTORY: No family history on file.  EXAM: BP (!) 148/102   Pulse (!) 58   Temp 98.4 F (36.9 C) (Oral)   Resp 16   Ht 6' (1.829 m)   Wt 110.2 kg (243 lb)   SpO2 97%   BMI 32.96 kg/m  CONSTITUTIONAL: Alert and oriented and responds appropriately to questions. Well-appearing; well-nourished HEAD: Normocephalic EYES: Conjunctivae clear, pupils appear equal, EOMI ENT: normal nose; moist mucous membranes NECK: Supple, no meningismus, no nuchal rigidity, no LAD  CARD: RRR; S1 and S2 appreciated; no murmurs, no clicks, no rubs, no gallops RESP: Normal chest excursion without splinting or tachypnea; breath sounds clear and equal bilaterally; no wheezes, no rhonchi, no rales, no hypoxia or respiratory distress, speaking full sentences ABD/GI: Normal bowel sounds; non-distended; soft, non-tender, no rebound, no guarding, no peritoneal signs, no hepatosplenomegaly BACK:  The back appears normal and is non-tender to palpation, there is no CVA tenderness EXT: Normal ROM in all joints; non-tender to palpation; no edema; normal capillary refill; no cyanosis, no calf tenderness or swelling  SKIN: Normal color for age and race; warm; no rash NEURO: Moves all extremities equally PSYCH: The patient's mood and manner are appropriate. Grooming and personal hygiene are appropriate.  MEDICAL DECISION MAKING: Patient here with complaints of shortness of breath.  Has had extensive workup for the same.  Symptoms ongoing for 6 months and intermittent.  EKG here shows no ischemic abnormality, no arrhythmia.  Does have mildly elevated creatinine but this appears to  be baseline for him.  He is a large very muscular man.  Normal BUN.  Hemoglobin is 17 which is elevated but still in the normal range.  Troponin is negative.  He has had a negative d-dimer previously with similar symptoms.  Chest x-ray is clear.  Early asymptomatic.  He was told by his hematologist that this may be polycythemia vera and has follow-up tomorrow.  I do not feel he needs admission at this time.  I doubt that this is ACS, PE, dissection.  No signs of volume overload, pneumonia.  No pneumothorax.  I do not feel he needs phlebotomy for removal of blood emergently.  Patient and his wife are comfortable with this plan.  They will follow-up as an outpatient.  Discussed with him that have his hematology workup comes back unremarkable that I have recommended close follow-up with his cardiologist.  At this time, I do not feel there is any life-threatening condition present. I have reviewed and discussed all results (EKG, imaging, lab, urine as appropriate) and exam findings with patient/family. I have reviewed nursing notes and appropriate previous records.  I feel the patient is safe to be discharged home without further emergent workup and can continue workup as an outpatient as needed. Discussed usual and customary return precautions. Patient/family verbalize understanding and are comfortable with this plan.  Outpatient follow-up has been provided if needed. All questions have been answered.      EKG Interpretation  Date/Time:  Wednesday August 12 2017 18:45:47 EST Ventricular Rate:  58 PR Interval:  142 QRS Duration: 94 QT Interval:  372 QTC Calculation: 365 R Axis:   51 Text Interpretation:  Sinus bradycardia Otherwise normal ECG No significant change since last tracing Confirmed by Greogory Cornette, Cyril Mourning [phone]) on 08/13/2017 12:59:58 AM         Marrion Finan, Delice Bison, DO 08/13/17 0150

## 2017-08-13 NOTE — Discharge Instructions (Signed)
Please follow-up with your hematologist as scheduled on Friday.  If your workup there is unrevealing, I recommend close follow-up with your cardiologist.  Your blood work, EKG and chest x-ray today were normal.

## 2017-08-14 ENCOUNTER — Encounter: Payer: Self-pay | Admitting: Hematology and Oncology

## 2017-08-14 ENCOUNTER — Inpatient Hospital Stay (HOSPITAL_BASED_OUTPATIENT_CLINIC_OR_DEPARTMENT_OTHER): Payer: 59 | Admitting: Hematology and Oncology

## 2017-08-14 ENCOUNTER — Telehealth: Payer: Self-pay | Admitting: Hematology and Oncology

## 2017-08-14 VITALS — BP 152/81 | HR 57 | Temp 97.7°F | Resp 18 | Ht 72.0 in | Wt 245.2 lb

## 2017-08-14 DIAGNOSIS — R0609 Other forms of dyspnea: Secondary | ICD-10-CM

## 2017-08-14 DIAGNOSIS — I10 Essential (primary) hypertension: Secondary | ICD-10-CM

## 2017-08-14 DIAGNOSIS — Z803 Family history of malignant neoplasm of breast: Secondary | ICD-10-CM | POA: Diagnosis not present

## 2017-08-14 DIAGNOSIS — Z85820 Personal history of malignant melanoma of skin: Secondary | ICD-10-CM | POA: Diagnosis not present

## 2017-08-14 DIAGNOSIS — R202 Paresthesia of skin: Secondary | ICD-10-CM

## 2017-08-14 DIAGNOSIS — D751 Secondary polycythemia: Secondary | ICD-10-CM

## 2017-08-14 MED ORDER — ALBUTEROL SULFATE HFA 108 (90 BASE) MCG/ACT IN AERS: 2.0000 | INHALATION_SPRAY | Freq: Four times a day (QID) | RESPIRATORY_TRACT | 2 refills | Status: DC | PRN

## 2017-08-14 NOTE — Telephone Encounter (Signed)
Gave avs and calendar for february °

## 2017-08-28 DIAGNOSIS — D751 Secondary polycythemia: Secondary | ICD-10-CM | POA: Insufficient documentation

## 2017-08-28 NOTE — Progress Notes (Signed)
[facility] New Visit:  Assessment: Erythrocytosis 57 y.o. male with moderate erythrocytosis that has developed between 2013 2015 with previously normal numbers.  No associated leukocytosis of thrombophilia.  Patient is currently on testosterone supplementation.  Differential diagnosis includes elevated hemoglobin due to testosterone supplementation versus primary versus secondary years.  Plan: -Repeat lab work today including erythropoietin level. -Return to clinic in 1 week to discuss results and assess further. -Consult genetic counselor due to strong history of breast cancer in the family with first and second-degree relatives with early breast cancer development.  Voice recognition software was used and creation of this note. Despite my best effort at editing the text, some misspelling/errors may have occurred.  Orders Placed This Encounter  Procedures  . CBC with Differential (Cancer Center Only)    Standing Status:   Future    Number of Occurrences:   1    Standing Expiration Date:   08/07/2018  . CMP ([place] only)    Standing Status:   Future    Number of Occurrences:   1    Standing Expiration Date:   08/07/2018  . Erythropoietin    Standing Status:   Future    Number of Occurrences:   1    Standing Expiration Date:   08/07/2018    All questions were answered.  . The patient knows to call the clinic with any problems, questions or concerns.  This note was electronically signed.    History of Presenting Illness Michael Choi 57 y.o. presenting to the [facility] for evaluation of elevated hematocrit, referred by Side Garret Reddish, PA-C.  Patient's past medical history significant for hypertension, history of stage I malignant melanoma of the skin 12 years ago, testosterone insufficiency.  Raquel Sarna history significant for sister with breast cancer in her 29s with possible brain metastasis, mother with breast cancer in her 71s.  It is a lifelong non-smoker,  does not drink alcohol to excess.  Patient was found to have significantly elevated blood pressures as high as 219/180 and was treated with losartan reducing blood pressures down to 140s over 90s.  His complaints include upper extremity paresthesias and slight dizziness in addition to dyspnea on exertion and shortness of breath for approximately 1 year.  No fevers, chills, night sweats.  No facial flushing.  Oncological/hematological History: --Labs, 04/06/09: WBC 5.3, Hgb 14.7, Hct 42.6, MCV 88.0, MCH 30.4, RDW 12.9, Plt 244 --Labs, 09/29/11: WBC 6.4, Hgb 14.4, Hct 41.7, MCV 87.8, MCH 30.3, RDW 12.7, Plt 217 --Labs, 09/27/13: WBC 7.1, Hgb 17.1, Hct 49.9, MCV 94.0, MCH 32.1, RDW 13.9, Plt 295 --Labs, 03/30/15: WBC 6.7, Hgb 16.9, Hct 49.8, MCV 91.0, MCH 30.7, RDW 13.8, Plt 232  --Labs, 09/03/15: WBC 7.6, Hgb 17.8, Hct 50.5, MCV 89.0, MCH 31.4, RDW 14.0, Plt 275 --Labs, 09/26/16: WBC 6.9, Hgb 17.7, Hct 50.3, MCV 91.0, MCH 31.9, RDW 14.1, Plt 246 --Labs, 01/09/17: WBC 7.9, Hgb 17.4, Hct 51.1, MCV 92.0, MCH 31.3, RDW 14.0, Plt 268; Cr 1.3 --Labs, 01/27/17: WBC 7.2, Hgb 17.2, Hct 50.4, MCV 92.0, MCH 31.4, RDW 13.4, Plt 232  Medical History: Past Medical History:  Diagnosis Date  . Melanoma [facility])     Surgical History: Past Surgical History:  Procedure Laterality Date  . KNEE ARTHROSCOPY      Family History: History reviewed. No pertinent family history.  Social History: Social History   Socioeconomic History  . Marital status: Married    Spouse name: Not on file  . Number  of children: Not on file  . Years of education: Not on file  . Highest education level: Not on file  Social Needs  . Financial resource strain: Not on file  . Food insecurity - worry: Not on file  . Food insecurity - inability: Not on file  . Transportation needs - medical: Not on file  . Transportation needs - non-medical: Not on file  Occupational History  . Not on file  Tobacco Use  . Smoking status:  Never Smoker  . Smokeless tobacco: Never Used  Substance and Sexual Activity  . Alcohol use: No  . Drug use: No  . Sexual activity: Not on file  Other Topics Concern  . Not on file  Social History Narrative  . Not on file    Allergies: Allergies  Allergen Reactions  . Doxycycline Swelling  . Tetracycline Rash    Medications:  Current Outpatient Medications  Medication Sig Dispense Refill  . amLODipine (NORVASC) 5 MG tablet Take 5 mg by mouth daily.  3  . losartan (COZAAR) 50 MG tablet Take 50 mg by mouth daily.  3  . multivitamin (THERAGRAN) per tablet Take 1 tablet by mouth daily.    Marland Kitchen testosterone cypionate (DEPOTESTOSTERONE CYPIONATE) 200 MG/ML injection Inject 3 mLs into the muscle every 14 (fourteen) days.    Marland Kitchen albuterol (PROVENTIL HFA;VENTOLIN HFA) 108 (90 Base) MCG/ACT inhaler Inhale 2 puffs into the lungs every 6 (six) hours as needed for wheezing or shortness of breath. 1 Inhaler 2   No current facility-administered medications for this visit.     Review of Systems: Review of Systems  Respiratory: Positive for shortness of breath.   All other systems reviewed and are negative.    PHYSICAL EXAMINATION Blood pressure 133/87, pulse (!) 58, temperature 98.5 F (36.9 C), temperature source Oral, resp. rate 20, weight 243 lb 1.6 oz (110.3 kg), SpO2 98 %.  ECOG PERFORMANCE STATUS: 1 - Symptomatic but completely ambulatory  Physical Exam  Constitutional: He is oriented to person, place, and time and well-developed, well-nourished, and in no distress. No distress.  HENT:  Head: Normocephalic and atraumatic.  Mouth/Throat: No oropharyngeal exudate.  Eyes: Conjunctivae and EOM are normal. Pupils are equal, round, and reactive to light. No scleral icterus.  Neck: No thyromegaly present.  Cardiovascular: Normal rate, regular rhythm and normal heart sounds.  No murmur heard. Pulmonary/Chest: Effort normal and breath sounds normal. No respiratory distress. He has no  wheezes. He has no rales.  Abdominal: Soft. Bowel sounds are normal. He exhibits no distension and no mass. There is no tenderness. There is no guarding.  Musculoskeletal: He exhibits no edema.  Lymphadenopathy:    He has no cervical adenopathy.  Neurological: He is alert and oriented to person, place, and time. He has normal reflexes. No cranial nerve deficit.  Skin: Skin is dry. No rash noted. He is not diaphoretic. No erythema.     LABORATORY DATA: I have personally reviewed the data as listed: Appointment on 08/07/2017  Component Date Value Ref Range Status  . WBC Count 08/07/2017 6.7  4.0 - 10.3 K/uL Final  . RBC 08/07/2017 5.48  4.20 - 5.82 MIL/uL Final  . Hemoglobin 08/07/2017 17.3* 13.0 - 17.1 g/dL Final  . HCT 08/07/2017 51.0* 38.4 - 49.9 % Final  . MCV 08/07/2017 93.1  79.3 - 98.0 fL Final  . MCH 08/07/2017 31.6  27.2 - 33.4 pg Final  . MCHC 08/07/2017 33.9  32.0 - 36.0 g/dL Final  . RDW  08/07/2017 13.5  11.0 - 15.6 % Final  . Platelet Count 08/07/2017 218  140 - 400 K/uL Final  . Neutrophils Relative % 08/07/2017 52  % Final  . Neutro Abs 08/07/2017 3.5  1.5 - 6.5 K/uL Final  . Lymphocytes Relative 08/07/2017 30  % Final  . Lymphs Abs 08/07/2017 2.0  0.9 - 3.3 K/uL Final  . Monocytes Relative 08/07/2017 15  % Final  . Monocytes Absolute 08/07/2017 1.0* 0.1 - 0.9 K/uL Final  . Eosinophils Relative 08/07/2017 2  % Final  . Eosinophils Absolute 08/07/2017 0.1  0.0 - 0.5 K/uL Final  . Basophils Relative 08/07/2017 1  % Final  . Basophils Absolute 08/07/2017 0.0  0.0 - 0.1 K/uL Final   Performed at [facility], [place] [address] [place], [place] [address]  . Sodium 08/07/2017 140  136 - 145 mmol/L Final  . Potassium 08/07/2017 4.7  3.5 - 5.1 mmol/L Final  . Chloride 08/07/2017 104  98 - 109 mmol/L Final  . CO2 08/07/2017 28  22 - 29 mmol/L Final  . Glucose, Bld 08/07/2017 87  70 - 140 mg/dL Final  . BUN 08/07/2017 15  7 - 26 mg/dL Final  .  Creatinine 08/07/2017 1.34* 0.70 - 1.30 mg/dL Final  . Calcium 08/07/2017 9.1  8.4 - 10.4 mg/dL Final  . Total Protein 08/07/2017 7.5  6.4 - 8.3 g/dL Final  . Albumin 08/07/2017 3.9  3.5 - 5.0 g/dL Final  . AST 08/07/2017 38* 5 - 34 U/L Final  . ALT 08/07/2017 51  0 - 55 U/L Final  . Alkaline Phosphatase 08/07/2017 55  40 - 150 U/L Final  . Total Bilirubin 08/07/2017 0.8  0.2 - 1.2 mg/dL Final  . GFR, Est Non Af Am 08/07/2017 57* >60 mL/min Final  . GFR, Est AFR Am 08/07/2017 >60  >60 mL/min Final   Comment: (NOTE) The eGFR has been calculated using the CKD EPI equation. This calculation has not been validated in all clinical situations. eGFR's persistently <60 mL/min signify possible Chronic Kidney Disease.   Georgiann Hahn gap 08/07/2017 8  3 - 11 Final   Performed at [facility], [place] [address], [place], [place] [address]  . Erythropoietin 08/07/2017 9.2  2.6 - 18.5 mIU/mL Final   Comment: (NOTE) Beckman [employer] UniCel DxI [place] obtained with different assay methods or kits cannot be used interchangeably. Results cannot be interpreted as absolute evidence of the presence or absence of malignant disease. Performed At: [facility] [place], [place] [id number] Rush Farmer MD LH:[id number] Performed at [facility], [place] [address] [place], [place] [address]          Ardath Sax, MD

## 2017-08-28 NOTE — Assessment & Plan Note (Signed)
58 y.o. male with moderate erythrocytosis that has developed between 2013 2015 with previously normal numbers.  No associated leukocytosis of thrombophilia.  Patient is currently on testosterone supplementation.  Differential diagnosis includes elevated hemoglobin due to testosterone supplementation versus primary versus secondary years.  Plan: -Repeat lab work today including erythropoietin level. -Return to clinic in 1 week to discuss results and assess further. -Consult genetic counselor due to strong history of breast cancer in the family with first and second-degree relatives with early breast cancer development.

## 2017-09-04 ENCOUNTER — Encounter: Payer: Self-pay | Admitting: Hematology and Oncology

## 2017-09-04 NOTE — Assessment & Plan Note (Signed)
57 y.o. male with moderate erythrocytosis that has developed between 2013-2015 with previously normal numbers.  No associated leukocytosis of thrombophilia.  Patient is currently on testosterone supplementation.  Differential diagnosis includes elevated hemoglobin due to testosterone supplementation versus primary versus secondary polycythemia.  Additional lab work demonstrates normal level of erythropoietin consistent with secondary polycythemia.  In this context, his current hematocrit does not benefit from therapeutic phlebotomy.  Assessment for additional possible etiologies of the secondary erythrocytosis will be necessary.  Plan: -Consider discontinuing testosterone supplementation if possible -PFTs with methacholine challenge -Albuterol inhaler as needed -Return to clinic in 1 month with lab work for continued monitoring.

## 2017-09-04 NOTE — Progress Notes (Signed)
[facility] Follow-up Visit:  Assessment: Secondary erythrocytosis 57 y.o. male with moderate erythrocytosis that has developed between 2013-2015 with previously normal numbers.  No associated leukocytosis of thrombophilia.  Patient is currently on testosterone supplementation.  Differential diagnosis includes elevated hemoglobin due to testosterone supplementation versus primary versus secondary polycythemia.  Additional lab work demonstrates normal level of erythropoietin consistent with secondary polycythemia.  In this context, his current hematocrit does not benefit from therapeutic phlebotomy.  Assessment for additional possible etiologies of the secondary erythrocytosis will be necessary.  Plan: -Consider discontinuing testosterone supplementation if possible -PFTs with methacholine challenge -Albuterol inhaler as needed -Return to clinic in 1 month with lab work for continued monitoring.  Voice recognition software was used and creation of this note. Despite my best effort at editing the text, some misspelling/errors may have occurred.  Orders Placed This Encounter  Procedures  . CBC with Differential (Cancer Center Only)    Standing Status:   Future    Standing Expiration Date:   08/14/2018  . Pulmonary Function Test    Standing Status:   Future    Standing Expiration Date:   08/14/2018    Order Specific Question:   Where should this test be performed?    Answer:   [facility]    Order Specific Question:   Full PFT: includes the following: basic spirometry, spirometry pre & post bronchodilator, diffusion capacity (DLCO), lung volumes    Answer:   Full PFT    Order Specific Question:   MIP/MEP    Answer:   Yes    Order Specific Question:   6 minute walk    Answer:   Yes    Order Specific Question:   ABG    Answer:   Yes    Order Specific Question:   Diffusion capacity (DLCO)    Answer:   Yes    Order Specific Question:   Lung volumes    Answer:   Yes   Order Specific Question:   Methacholine challenge    Answer:   Yes    Cancer Staging No matching staging information was found for the patient.  All questions were answered.  . The patient knows to call the clinic with any problems, questions or concerns.  This note was electronically signed.    History of Presenting Illness Michael Choi is a 57 y.o. followed in the [facility] for diagnosis of secondary eruthrocytocis, referred by Side Garret Reddish, PA-C.  Patient's past medical history significant for hypertension, history of stage I malignant melanoma of the skin 12 years ago, testosterone insufficiency.  Raquel Sarna history significant for sister with breast cancer in her 48s with possible brain metastasis, mother with breast cancer in her 39s.  It is a lifelong non-smoker, does not drink alcohol to excess.  Patient was found to have significantly elevated blood pressures as high as 219/180 and was treated with losartan reducing blood pressures down to 140s over 90s.  His complaints include upper extremity paresthesias and slight dizziness in addition to dyspnea on exertion and shortness of breath for approximately 1 year.  No fevers, chills, night sweats.  No facial flushing.  Patient returns to the clinic to discuss labs obtained during our last visit.  Patient did have a visit in the emergency room on 01/23 with shortness of breath.  This was similar to his past episodes which she has been experiencing for at least 6 months and associated with anxiety and chest tightness.  Evaluation to  the emergency room demonstrated no evidence of myocardial injury.  Patient has felt significantly better and does not have any shortness of breath at this time.  Oncological/hematological History: --Labs, 04/06/09: WBC 5.3, Hgb 14.7, Hct 42.6, MCV 88.0, MCH 30.4, RDW 12.9, Plt 244 --Labs, 09/29/11: WBC 6.4, Hgb 14.4, Hct 41.7, MCV 87.8, MCH 30.3, RDW 12.7, Plt 217 --Labs, 09/27/13: WBC 7.1, Hgb 17.1, Hct 49.9, MCV  94.0, MCH 32.1, RDW 13.9, Plt 295 --Labs, 03/30/15: WBC 6.7, Hgb 16.9, Hct 49.8, MCV 91.0, MCH 30.7, RDW 13.8, Plt 232  --Labs, 09/03/15: WBC 7.6, Hgb 17.8, Hct 50.5, MCV 89.0, MCH 31.4, RDW 14.0, Plt 275 --Labs, 09/26/16: WBC 6.9, Hgb 17.7, Hct 50.3, MCV 91.0, MCH 31.9, RDW 14.1, Plt 246 --Labs, 01/09/17: WBC 7.9, Hgb 17.4, Hct 51.1, MCV 92.0, MCH 31.3, RDW 14.0, Plt 268; Cr 1.3 --Labs, 01/27/17: WBC 7.2, Hgb 17.2, Hct 50.4, MCV 92.0, MCH 31.4, RDW 13.4, Plt 232 --Labs, 08/07/17: WBC 6.7, Hgb 17.3, Hct 51.0, MCV 93.1, MCH 31.6, RDW 13.5, Plt 218; Epo 9.2 --Labs, 08/12/17: WBC 7.8, Hgb 17.0, Hct 49.3, MCV 92.8, MCH 32.0, RDW 13.3, Plt 254   Medical History: Past Medical History:  Diagnosis Date  . Melanoma [facility])     Surgical History: Past Surgical History:  Procedure Laterality Date  . KNEE ARTHROSCOPY      Family History: No family history on file.  Social History: Social History   Socioeconomic History  . Marital status: Married    Spouse name: Not on file  . Number of children: Not on file  . Years of education: Not on file  . Highest education level: Not on file  Social Needs  . Financial resource strain: Not on file  . Food insecurity - worry: Not on file  . Food insecurity - inability: Not on file  . Transportation needs - medical: Not on file  . Transportation needs - non-medical: Not on file  Occupational History  . Not on file  Tobacco Use  . Smoking status: Never Smoker  . Smokeless tobacco: Never Used  Substance and Sexual Activity  . Alcohol use: No  . Drug use: No  . Sexual activity: Not on file  Other Topics Concern  . Not on file  Social History Narrative  . Not on file    Allergies: Allergies  Allergen Reactions  . Doxycycline Swelling  . Tetracycline Rash    Medications:  Current Outpatient Medications  Medication Sig Dispense Refill  . albuterol (PROVENTIL HFA;VENTOLIN HFA) 108 (90 Base) MCG/ACT inhaler Inhale 2 puffs into the lungs  every 6 (six) hours as needed for wheezing or shortness of breath. 1 Inhaler 2  . amLODipine (NORVASC) 5 MG tablet Take 5 mg by mouth daily.  3  . losartan (COZAAR) 50 MG tablet Take 50 mg by mouth daily.  3  . multivitamin (THERAGRAN) per tablet Take 1 tablet by mouth daily.    Marland Kitchen testosterone cypionate (DEPOTESTOSTERONE CYPIONATE) 200 MG/ML injection Inject 3 mLs into the muscle every 14 (fourteen) days.     No current facility-administered medications for this visit.     Review of Systems: Review of Systems  Respiratory: Positive for shortness of breath.   All other systems reviewed and are negative.    PHYSICAL EXAMINATION Blood pressure (!) 152/81, pulse (!) 57, temperature 97.7 F (36.5 C), temperature source Oral, resp. rate 18, height 6' (1.829 m), weight 245 lb 3.2 oz (111.2 kg), SpO2 98 %.  ECOG PERFORMANCE STATUS: 2 - Symptomatic, <  50% confined to bed  Physical Exam  Constitutional: He is oriented to person, place, and time and well-developed, well-nourished, and in no distress. No distress.  HENT:  Head: Normocephalic and atraumatic.  Mouth/Throat: No oropharyngeal exudate.  Eyes: Conjunctivae and EOM are normal. Pupils are equal, round, and reactive to light. No scleral icterus.  Neck: No thyromegaly present.  Cardiovascular: Normal rate, regular rhythm and normal heart sounds.  No murmur heard. Pulmonary/Chest: Effort normal and breath sounds normal. No respiratory distress. He has no wheezes. He has no rales.  Abdominal: Soft. Bowel sounds are normal. He exhibits no distension and no mass. There is no tenderness. There is no guarding.  Musculoskeletal: He exhibits no edema.  Lymphadenopathy:    He has no cervical adenopathy.  Neurological: He is alert and oriented to person, place, and time. He has normal reflexes. No cranial nerve deficit.  Skin: Skin is dry. No rash noted. He is not diaphoretic. No erythema.     LABORATORY DATA: I have personally reviewed the  data as listed: Admission on 08/13/2017, Discharged on 08/13/2017  Component Date Value Ref Range Status  . Sodium 08/12/2017 142  135 - 145 mmol/L Final  . Potassium 08/12/2017 4.6  3.5 - 5.1 mmol/L Final  . Chloride 08/12/2017 109  101 - 111 mmol/L Final  . CO2 08/12/2017 24  22 - 32 mmol/L Final  . Glucose, Bld 08/12/2017 114* 65 - 99 mg/dL Final  . BUN 08/12/2017 14  6 - 20 mg/dL Final  . Creatinine, Ser 08/12/2017 1.42* 0.61 - 1.24 mg/dL Final  . Calcium 08/12/2017 9.1  8.9 - 10.3 mg/dL Final  . GFR calc non Af Amer 08/12/2017 53* >60 mL/min Final  . GFR calc Af Amer 08/12/2017 >60  >60 mL/min Final   Comment: (NOTE) The eGFR has been calculated using the CKD EPI equation. This calculation has not been validated in all clinical situations. eGFR's persistently <60 mL/min signify possible Chronic Kidney Disease.   . Anion gap 08/12/2017 9  5 - 15 Final  . WBC 08/12/2017 7.8  4.0 - 10.5 K/uL Final  . RBC 08/12/2017 5.31  4.22 - 5.81 MIL/uL Final  . Hemoglobin 08/12/2017 17.0  13.0 - 17.0 g/dL Final  . HCT 08/12/2017 49.3  39.0 - 52.0 % Final  . MCV 08/12/2017 92.8  78.0 - 100.0 fL Final  . MCH 08/12/2017 32.0  26.0 - 34.0 pg Final  . MCHC 08/12/2017 34.5  30.0 - 36.0 g/dL Final  . RDW 08/12/2017 13.3  11.5 - 15.5 % Final  . Platelets 08/12/2017 254  150 - 400 K/uL Final  . Troponin i, poc 08/12/2017 0.01  0.00 - 0.08 ng/mL Final  . Comment 3 08/12/2017          Final   Comment: Due to the release kinetics of cTnI, a negative result within the first hours of the onset of symptoms does not rule out myocardial infarction with certainty. If myocardial infarction is still suspected, repeat the test at appropriate intervals.        Ardath Sax, MD

## 2017-09-07 ENCOUNTER — Inpatient Hospital Stay: Payer: 59

## 2017-09-07 ENCOUNTER — Telehealth: Payer: Self-pay | Admitting: Hematology and Oncology

## 2017-09-07 ENCOUNTER — Inpatient Hospital Stay: Payer: 59 | Attending: Hematology and Oncology | Admitting: Hematology and Oncology

## 2017-09-07 ENCOUNTER — Other Ambulatory Visit: Payer: Self-pay

## 2017-09-07 ENCOUNTER — Encounter: Payer: Self-pay | Admitting: Hematology and Oncology

## 2017-09-07 VITALS — BP 140/92 | HR 59 | Temp 97.6°F | Resp 18 | Ht 72.0 in | Wt 243.6 lb

## 2017-09-07 DIAGNOSIS — I10 Essential (primary) hypertension: Secondary | ICD-10-CM

## 2017-09-07 DIAGNOSIS — D751 Secondary polycythemia: Secondary | ICD-10-CM | POA: Diagnosis not present

## 2017-09-07 DIAGNOSIS — Z85820 Personal history of malignant melanoma of skin: Secondary | ICD-10-CM | POA: Diagnosis not present

## 2017-09-07 LAB — CBC WITH DIFFERENTIAL (CANCER CENTER ONLY)
BASOS PCT: 1 %
Basophils Absolute: 0.1 10*3/uL (ref 0.0–0.1)
EOS ABS: 0.2 10*3/uL (ref 0.0–0.5)
Eosinophils Relative: 2 %
HEMATOCRIT: 49.1 % (ref 38.4–49.9)
HEMOGLOBIN: 16.5 g/dL (ref 13.0–17.1)
Lymphocytes Relative: 27 %
Lymphs Abs: 1.9 10*3/uL (ref 0.9–3.3)
MCH: 31.1 pg (ref 27.2–33.4)
MCHC: 33.6 g/dL (ref 32.0–36.0)
MCV: 92.5 fL (ref 79.3–98.0)
Monocytes Absolute: 1 10*3/uL — ABNORMAL HIGH (ref 0.1–0.9)
Monocytes Relative: 14 %
NEUTROS ABS: 4.1 10*3/uL (ref 1.5–6.5)
NEUTROS PCT: 56 %
Platelet Count: 233 10*3/uL (ref 140–400)
RBC: 5.31 MIL/uL (ref 4.20–5.82)
RDW: 13.6 % (ref 11.0–14.6)
WBC: 7.2 10*3/uL (ref 4.0–10.3)

## 2017-09-07 NOTE — Telephone Encounter (Signed)
Appointment scheduled per 2/18 los

## 2017-09-27 DIAGNOSIS — E291 Testicular hypofunction: Secondary | ICD-10-CM | POA: Insufficient documentation

## 2017-09-27 NOTE — Progress Notes (Signed)
[facility] Follow-up Visit:  Assessment: No problem-specific Assessment & Plan notes found for this encounter.  Voice recognition software was used and creation of this note. Despite my best effort at editing the text, some misspelling/errors may have occurred.  No orders of the defined types were placed in this encounter.   Cancer Staging No matching staging information was found for the patient.  All questions were answered.  . The patient knows to call the clinic with any problems, questions or concerns.  This note was electronically signed.    History of Presenting Illness Michael Choi is a 57 y.o. followed in the [facility] for diagnosis of secondary eruthrocytocis, referred by Side Garret Reddish, PA-C.  Patient's past medical history significant for hypertension, history of stage I malignant melanoma of the skin 12 years ago, testosterone insufficiency.  Raquel Sarna history significant for sister with breast cancer in her 95s with possible brain metastasis, mother with breast cancer in her 40s.  It is a lifelong non-smoker, does not drink alcohol to excess.  Patient was found to have significantly elevated blood pressures as high as 219/180 and was treated with losartan reducing blood pressures down to 140s over 90s.  His complaints include upper extremity paresthesias and slight dizziness in addition to dyspnea on exertion and shortness of breath for approximately 1 year.  No fevers, chills, night sweats.  No facial flushing.  Patient returns to the clinic for continued hematological monitoring.  Denies any new complaints since the last visit to the clinic.  Oncological/hematological History: --Labs, 04/06/09: WBC 5.3, Hgb 14.7, Hct 42.6, MCV 88.0, MCH 30.4, RDW 12.9, Plt 244 --Labs, 09/29/11: WBC 6.4, Hgb 14.4, Hct 41.7, MCV 87.8, MCH 30.3, RDW 12.7, Plt 217 --Labs, 09/27/13: WBC 7.1, Hgb 17.1, Hct 49.9, MCV 94.0, MCH 32.1, RDW 13.9, Plt 295 --Labs, 03/30/15: WBC  6.7, Hgb 16.9, Hct 49.8, MCV 91.0, MCH 30.7, RDW 13.8, Plt 232  --Labs, 09/03/15: WBC 7.6, Hgb 17.8, Hct 50.5, MCV 89.0, MCH 31.4, RDW 14.0, Plt 275 --Labs, 09/26/16: WBC 6.9, Hgb 17.7, Hct 50.3, MCV 91.0, MCH 31.9, RDW 14.1, Plt 246 --Labs, 01/09/17: WBC 7.9, Hgb 17.4, Hct 51.1, MCV 92.0, MCH 31.3, RDW 14.0, Plt 268; Cr 1.3 --Labs, 01/27/17: WBC 7.2, Hgb 17.2, Hct 50.4, MCV 92.0, MCH 31.4, RDW 13.4, Plt 232 --Labs, 08/07/17: WBC 6.7, Hgb 17.3, Hct 51.0, MCV 93.1, MCH 31.6, RDW 13.5, Plt 218; Epo 9.2 --Labs, 08/12/17: WBC 7.8, Hgb 17.0, Hct 49.3, MCV 92.8, MCH 32.0, RDW 13.3, Plt 254 --Labs, 09/07/17: WBC 7.2, Hgb 16.5, Hct 49.1,      Plt 233;   Medical History: Past Medical History:  Diagnosis Date  . Melanoma [facility])     Surgical History: Past Surgical History:  Procedure Laterality Date  . KNEE ARTHROSCOPY      Family History: No family history on file.  Social History: Social History   Socioeconomic History  . Marital status: Married    Spouse name: Not on file  . Number of children: Not on file  . Years of education: Not on file  . Highest education level: Not on file  Social Needs  . Financial resource strain: Not on file  . Food insecurity - worry: Not on file  . Food insecurity - inability: Not on file  . Transportation needs - medical: Not on file  . Transportation needs - non-medical: Not on file  Occupational History  . Not on file  Tobacco Use  . Smoking status: Never Smoker  .  Smokeless tobacco: Never Used  Substance and Sexual Activity  . Alcohol use: No  . Drug use: No  . Sexual activity: Not on file  Other Topics Concern  . Not on file  Social History Narrative  . Not on file    Allergies: Allergies  Allergen Reactions  . Doxycycline Swelling  . Tetracycline Rash    Medications:  Current Outpatient Medications  Medication Sig Dispense Refill  . albuterol (PROVENTIL HFA;VENTOLIN HFA) 108 (90 Base) MCG/ACT inhaler Inhale 2 puffs into the  lungs every 6 (six) hours as needed for wheezing or shortness of breath. 1 Inhaler 2  . amLODipine (NORVASC) 5 MG tablet Take 5 mg by mouth daily.  3  . losartan (COZAAR) 50 MG tablet Take 50 mg by mouth daily.  3  . multivitamin (THERAGRAN) per tablet Take 1 tablet by mouth daily.    Marland Kitchen testosterone cypionate (DEPOTESTOSTERONE CYPIONATE) 200 MG/ML injection Inject 3 mLs into the muscle every 14 (fourteen) days.     No current facility-administered medications for this visit.     Review of Systems: Review of Systems  Respiratory: Positive for shortness of breath.   All other systems reviewed and are negative.    PHYSICAL EXAMINATION Blood pressure (!) 140/92, pulse (!) 59, temperature 97.6 F (36.4 C), temperature source Oral, resp. rate 18, height 6' (1.829 m), weight 243 lb 9.6 oz (110.5 kg), SpO2 93 %.  ECOG PERFORMANCE STATUS: 2 - Symptomatic, <50% confined to bed  Physical Exam  Constitutional: He is oriented to person, place, and time and well-developed, well-nourished, and in no distress. No distress.  HENT:  Head: Normocephalic and atraumatic.  Mouth/Throat: No oropharyngeal exudate.  Eyes: Conjunctivae and EOM are normal. Pupils are equal, round, and reactive to light. No scleral icterus.  Neck: No thyromegaly present.  Cardiovascular: Normal rate, regular rhythm and normal heart sounds.  No murmur heard. Pulmonary/Chest: Effort normal and breath sounds normal. No respiratory distress. He has no wheezes. He has no rales.  Abdominal: Soft. Bowel sounds are normal. He exhibits no distension and no mass. There is no tenderness. There is no guarding.  Musculoskeletal: He exhibits no edema.  Lymphadenopathy:    He has no cervical adenopathy.  Neurological: He is alert and oriented to person, place, and time. He has normal reflexes. No cranial nerve deficit.  Skin: Skin is dry. No rash noted. He is not diaphoretic. No erythema.     LABORATORY DATA: I have personally  reviewed the data as listed: Appointment on 09/07/2017  Component Date Value Ref Range Status  . WBC Count 09/07/2017 7.2  4.0 - 10.3 K/uL Final  . RBC 09/07/2017 5.31  4.20 - 5.82 MIL/uL Final  . Hemoglobin 09/07/2017 16.5  13.0 - 17.1 g/dL Final  . HCT 09/07/2017 49.1  38.4 - 49.9 % Final  . MCV 09/07/2017 92.5  79.3 - 98.0 fL Final  . MCH 09/07/2017 31.1  27.2 - 33.4 pg Final  . MCHC 09/07/2017 33.6  32.0 - 36.0 g/dL Final  . RDW 09/07/2017 13.6  11.0 - 14.6 % Final  . Platelet Count 09/07/2017 233  140 - 400 K/uL Final  . Neutrophils Relative % 09/07/2017 56  % Final  . Neutro Abs 09/07/2017 4.1  1.5 - 6.5 K/uL Final  . Lymphocytes Relative 09/07/2017 27  % Final  . Lymphs Abs 09/07/2017 1.9  0.9 - 3.3 K/uL Final  . Monocytes Relative 09/07/2017 14  % Final  . Monocytes Absolute 09/07/2017 1.0* 0.1 -  0.9 K/uL Final  . Eosinophils Relative 09/07/2017 2  % Final  . Eosinophils Absolute 09/07/2017 0.2  0.0 - 0.5 K/uL Final  . Basophils Relative 09/07/2017 1  % Final  . Basophils Absolute 09/07/2017 0.1  0.0 - 0.1 K/uL Final   Performed at [facility], [place] [address] [place], [place] [address]       Ardath Sax, MD

## 2017-09-27 NOTE — Assessment & Plan Note (Signed)
57 y.o. male with moderate erythrocytosis that has developed between 2013-2015 with previously normal numbers.  No associated leukocytosis of thrombophilia.  Patient is currently on testosterone supplementation.  Differential diagnosis includes elevated hemoglobin due to testosterone supplementation versus primary versus secondary polycythemia.  Additional lab work demonstrates normal level of erythropoietin consistent with secondary polycythemia.  In this context, his current hematocrit does not benefit from therapeutic phlebotomy.    There has been no significant progression of her erythrocytosis since the last visit to the clinic, some artifact, hemoglobin is somewhat better today.  Plan: -Consider discontinuing testosterone supplementation if possible -Continue hematological monitoring and administer therapeutic phlebotomy if hematocrit is over 55%. - Return to my clinic in 3 months for continued monitoring.

## 2017-10-05 DIAGNOSIS — R7989 Other specified abnormal findings of blood chemistry: Secondary | ICD-10-CM | POA: Diagnosis not present

## 2017-10-14 DIAGNOSIS — R7989 Other specified abnormal findings of blood chemistry: Secondary | ICD-10-CM | POA: Diagnosis not present

## 2017-11-26 ENCOUNTER — Observation Stay (HOSPITAL_COMMUNITY)
Admission: EM | Admit: 2017-11-26 | Discharge: 2017-11-27 | Disposition: A | Discharge status: Home / Self Care | Payer: 59 | Attending: Cardiology | Admitting: Cardiology

## 2017-11-26 ENCOUNTER — Encounter (HOSPITAL_COMMUNITY): Payer: Self-pay

## 2017-11-26 ENCOUNTER — Emergency Department (HOSPITAL_COMMUNITY): Payer: 59

## 2017-11-26 DIAGNOSIS — I428 Other cardiomyopathies: Secondary | ICD-10-CM | POA: Diagnosis not present

## 2017-11-26 DIAGNOSIS — Z79890 Hormone replacement therapy (postmenopausal): Secondary | ICD-10-CM | POA: Insufficient documentation

## 2017-11-26 DIAGNOSIS — Z7982 Long term (current) use of aspirin: Secondary | ICD-10-CM | POA: Diagnosis not present

## 2017-11-26 DIAGNOSIS — I10 Essential (primary) hypertension: Secondary | ICD-10-CM | POA: Insufficient documentation

## 2017-11-26 DIAGNOSIS — R000 Tachycardia, unspecified: Secondary | ICD-10-CM | POA: Diagnosis not present

## 2017-11-26 DIAGNOSIS — Z85820 Personal history of malignant melanoma of skin: Secondary | ICD-10-CM | POA: Diagnosis not present

## 2017-11-26 DIAGNOSIS — Z8249 Family history of ischemic heart disease and other diseases of the circulatory system: Secondary | ICD-10-CM | POA: Diagnosis not present

## 2017-11-26 DIAGNOSIS — G4730 Sleep apnea, unspecified: Secondary | ICD-10-CM | POA: Diagnosis not present

## 2017-11-26 DIAGNOSIS — D751 Secondary polycythemia: Secondary | ICD-10-CM | POA: Diagnosis present

## 2017-11-26 DIAGNOSIS — R079 Chest pain, unspecified: Secondary | ICD-10-CM

## 2017-11-26 DIAGNOSIS — Z79899 Other long term (current) drug therapy: Secondary | ICD-10-CM | POA: Insufficient documentation

## 2017-11-26 DIAGNOSIS — Z881 Allergy status to other antibiotic agents status: Secondary | ICD-10-CM | POA: Insufficient documentation

## 2017-11-26 DIAGNOSIS — I4892 Unspecified atrial flutter: Principal | ICD-10-CM | POA: Insufficient documentation

## 2017-11-26 DIAGNOSIS — R0602 Shortness of breath: Secondary | ICD-10-CM | POA: Diagnosis not present

## 2017-11-26 DIAGNOSIS — R0789 Other chest pain: Secondary | ICD-10-CM | POA: Diagnosis not present

## 2017-11-26 HISTORY — DX: Unspecified atrial flutter: I48.92

## 2017-11-26 HISTORY — DX: Essential (primary) hypertension: I10

## 2017-11-26 LAB — CBC
HCT: 51.4 % (ref 39.0–52.0)
Hemoglobin: 17.3 g/dL — ABNORMAL HIGH (ref 13.0–17.0)
MCH: 31.5 pg (ref 26.0–34.0)
MCHC: 33.7 g/dL (ref 30.0–36.0)
MCV: 93.5 fL (ref 78.0–100.0)
PLATELETS: 260 10*3/uL (ref 150–400)
RBC: 5.5 MIL/uL (ref 4.22–5.81)
RDW: 13.8 % (ref 11.5–15.5)
WBC: 8.6 10*3/uL (ref 4.0–10.5)

## 2017-11-26 LAB — BASIC METABOLIC PANEL
Anion gap: 9 (ref 5–15)
BUN: 13 mg/dL (ref 6–20)
CALCIUM: 8.9 mg/dL (ref 8.9–10.3)
CO2: 26 mmol/L (ref 22–32)
CREATININE: 1.49 mg/dL — AB (ref 0.61–1.24)
Chloride: 103 mmol/L (ref 101–111)
GFR calc non Af Amer: 50 mL/min — ABNORMAL LOW (ref >60)
GFR, EST AFRICAN AMERICAN: 58 mL/min — AB (ref >60)
GLUCOSE: 100 mg/dL — AB (ref 65–99)
Potassium: 4.1 mmol/L (ref 3.5–5.1)
Sodium: 138 mmol/L (ref 135–145)

## 2017-11-26 LAB — I-STAT TROPONIN, ED: Troponin i, poc: 0.04 ng/mL (ref 0.00–0.08)

## 2017-11-26 MED ORDER — LOSARTAN POTASSIUM 50 MG PO TABS
50.0000 mg | ORAL_TABLET | Freq: Every day | ORAL | Status: DC
  Administered 2017-11-27: 50 mg via ORAL
  Filled 2017-11-26 (×2): qty 1

## 2017-11-26 MED ORDER — DILTIAZEM HCL 25 MG/5ML IV SOLN
10.0000 mg | Freq: Once | INTRAVENOUS | Status: AC
  Administered 2017-11-26: 10 mg via INTRAVENOUS
  Filled 2017-11-26: qty 5

## 2017-11-26 MED ORDER — ACETAMINOPHEN 325 MG PO TABS: 650.0000 mg | ORAL_TABLET | ORAL | Status: DC | PRN

## 2017-11-26 MED ORDER — ALBUTEROL SULFATE (2.5 MG/3ML) 0.083% IN NEBU: 2.5000 mg | INHALATION_SOLUTION | Freq: Four times a day (QID) | RESPIRATORY_TRACT | Status: DC | PRN

## 2017-11-26 MED ORDER — LACTATED RINGERS IV BOLUS
2000.0000 mL | Freq: Once | INTRAVENOUS | Status: AC
  Administered 2017-11-26: 2000 mL via INTRAVENOUS

## 2017-11-26 MED ORDER — ASPIRIN 300 MG RE SUPP: 300.0000 mg | RECTAL | Status: DC

## 2017-11-26 MED ORDER — ONDANSETRON HCL 4 MG/2ML IJ SOLN: 4.0000 mg | Freq: Four times a day (QID) | INTRAMUSCULAR | Status: DC | PRN

## 2017-11-26 MED ORDER — ASPIRIN 81 MG PO CHEW: 324.0000 mg | CHEWABLE_TABLET | ORAL | Status: DC

## 2017-11-26 MED ORDER — NITROGLYCERIN 0.4 MG SL SUBL: 0.4000 mg | SUBLINGUAL_TABLET | SUBLINGUAL | Status: DC | PRN

## 2017-11-26 MED ORDER — DILTIAZEM HCL 60 MG PO TABS
30.0000 mg | ORAL_TABLET | Freq: Four times a day (QID) | ORAL | Status: DC
  Administered 2017-11-27 (×3): 30 mg via ORAL
  Filled 2017-11-26 (×4): qty 1

## 2017-11-26 MED ORDER — ASPIRIN EC 81 MG PO TBEC
81.0000 mg | DELAYED_RELEASE_TABLET | Freq: Every day | ORAL | Status: DC
  Administered 2017-11-27: 81 mg via ORAL
  Filled 2017-11-26: qty 1

## 2017-11-26 NOTE — ED Triage Notes (Signed)
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 Denies CP, diahporetic, denies other symptoms

## 2017-11-26 NOTE — ED Provider Notes (Signed)
[facility] EMERGENCY DEPARTMENT Provider Note   CSN: [id number] Arrival date & time: 11/26/17  1944     History   Chief Complaint Chief Complaint  Patient presents with  . Tachycardia    HPI Michael Choi is a 57 y.o. male.  HPI  Patient is a 57 year old male with a past medical history of hypertension as well as hypogonadism requiring steroid injections who comes in today complaining of left-sided chest tightness mild shortness of breath and tachycardia.  Patient is currently in the [employer] and was undergoing physical training when his symptoms presented.  Patient had his pulse checked and was found to be severely tachycardic to the 150s.  Patient was sent to urgent care where he was further referred to the emergency department.  Patient reveals that he has had previous episodes with similar symptoms but thought they were all related to his exercise.  Patient denies any recent illness, fevers, chills, nausea or vomiting.  Patient states that he had an episode of chest pain sometime ago for which he saw a cardiologist and had a stress test which showed no abnormalities.  Past Medical History:  Diagnosis Date  . Melanoma [facility])     Patient Active Problem List   Diagnosis Date Noted  . Atrial flutter ([place]) 11/26/2017  . Hypogonadism male 09/27/2017  . Secondary erythrocytosis 08/28/2017  . Prediabetes 04/03/2015  . HTN (hypertension) 10/13/2011  . Attention deficit hyperactivity disorder (ADHD) 04/02/2009    Past Surgical History:  Procedure Laterality Date  . KNEE ARTHROSCOPY          Home Medications    Prior to Admission medications   Medication Sig Start Date End Date Taking? Authorizing Provider  amLODipine (NORVASC) 5 MG tablet Take 5 mg by mouth daily. 12/31/16  Yes [provider]  aspirin EC 81 MG tablet Take 81 mg by mouth daily.   Yes [provider]  losartan (COZAAR) 50 MG tablet Take 50 mg by mouth daily. 01/20/17  Yes  [provider]  multivitamin (THERAGRAN) per tablet Take 1 tablet by mouth daily.   Yes [provider]  testosterone cypionate (DEPOTESTOSTERONE CYPIONATE) 200 MG/ML injection Inject 3 mLs into the muscle every 14 (fourteen) days. 01/07/17  Yes [provider]  albuterol (PROVENTIL HFA;VENTOLIN HFA) 108 (90 Base) MCG/ACT inhaler Inhale 2 puffs into the lungs every 6 (six) hours as needed for wheezing or shortness of breath. Patient not taking: Reported on 11/26/2017 08/14/17   Ardath Sax, MD    Family History History reviewed. No pertinent family history.  Social History Social History   Tobacco Use  . Smoking status: Never Smoker  . Smokeless tobacco: Never Used  Substance Use Topics  . Alcohol use: No  . Drug use: No     Allergies   Doxycycline and Tetracycline   Review of Systems Review of Systems  Constitutional: Negative for chills, diaphoresis, fatigue and fever.  Respiratory: Positive for shortness of breath.   Cardiovascular: Positive for chest pain and palpitations.  Gastrointestinal: Negative for nausea and vomiting.  Neurological: Negative for syncope, weakness and numbness.  All other systems reviewed and are negative.    Physical Exam Updated Vital Signs BP (!) 139/102   Pulse 88   Temp 97.8 F (36.6 C) (Oral)   Resp (!) 26   Wt 108.9 kg (240 lb)   SpO2 97%   BMI 32.55 kg/m   Physical Exam  Constitutional: He appears well-developed and well-nourished.  HENT:  Head: Normocephalic and atraumatic.  Eyes: Conjunctivae are normal.  Neck: Neck supple.  Cardiovascular:  No murmur heard. Tachycardic w/ irregular rhythm   Pulmonary/Chest: Effort normal and breath sounds normal. No respiratory distress.  Abdominal: Soft. There is no tenderness.  Musculoskeletal: He exhibits no edema.  Neurological: He is alert.  Skin: Skin is warm and dry.  Psychiatric: He has a normal mood and affect.  Nursing note and vitals  reviewed.    ED Treatments / Results  Labs (all labs ordered are listed, but only abnormal results are displayed) Labs Reviewed  BASIC METABOLIC PANEL - Abnormal; Notable for the following components:      Result Value   Glucose, Bld 100 (*)    Creatinine, Ser 1.49 (*)    GFR calc non Af Amer 50 (*)    GFR calc Af Amer 58 (*)    All other components within normal limits  CBC - Abnormal; Notable for the following components:   Hemoglobin 17.3 (*)    All other components within normal limits  HIV ANTIBODY (ROUTINE TESTING)  I-STAT TROPONIN, ED    EKG EKG Interpretation  Date/Time:  Thursday Nov 26 2017 19:50:13 EDT Ventricular Rate:  149 PR Interval:    QRS Duration: 84 QT Interval:  326 QTC Calculation: 513 R Axis:   64 Text Interpretation:   Critical Test Result: STEMI Atrial flutter with 2:1 A-V conduction Abnormal ECG Confirmed by Lacretia Leigh ([id number]) on 11/26/2017 8:06:07 PM   Radiology Dg Chest Port 1 View  Result Date: 11/26/2017 CLINICAL DATA:  Shortness of breath.  Possible chest pain. EXAM: PORTABLE CHEST 1 VIEW COMPARISON:  08/12/2017. FINDINGS: Cardiomegaly. Thoracic tortuosity. No consolidation or edema. No effusion or pneumothorax. No osseous findings. IMPRESSION: Cardiomegaly.  No active disease. Electronically Signed   By: Staci Righter M.D.   On: 11/26/2017 20:53    Procedures Procedures (including critical care time)  Medications Ordered in ED Medications  nitroGLYCERIN (NITROSTAT) SL tablet 0.4 mg (has no administration in time range)  acetaminophen (TYLENOL) tablet 650 mg (has no administration in time range)  ondansetron (ZOFRAN) injection 4 mg (has no administration in time range)  albuterol (PROVENTIL) (2.5 MG/3ML) 0.083% nebulizer solution 2.5 mg (has no administration in time range)  aspirin EC tablet 81 mg (has no administration in time range)  losartan (COZAAR) tablet 50 mg (has no administration in time range)  diltiazem (CARDIZEM) tablet  30 mg (has no administration in time range)  apixaban (ELIQUIS) tablet 5 mg (has no administration in time range)  diltiazem (CARDIZEM) injection 10 mg (10 mg Intravenous Given 11/26/17 2057)  lactated ringers bolus 2,000 mL (0 mLs Intravenous Stopped 11/26/17 2214)     Initial Impression / Assessment and Plan / ED Course  I have reviewed the triage vital signs and the nursing notes.  Pertinent labs & imaging results that were available during my care of the patient were reviewed by me and considered in my medical decision making (see chart for details).    Patient's laboratory work-up largely within normal limits, troponin is detectable but below threshold.  Patient with a creatinine of 1.5 which is consistent with previous measurements.  Patient was given 2 L of lactated Ringer's and 10 mg diltiazem with appropriate rate control achieved.  Do not believe cardioversion to be advisable as patient's symptoms, while beginning at 3 PM today, likely have been occurring previously and self resolving.  EKG reveals atrial flutter.  Cardiology consulted who state they will admit the  patient to their service for further evaluation and care.   Final Clinical Impressions(s) / ED Diagnoses   Final diagnoses:  Chest pain    ED Discharge Orders    None       Chapman Moss, MD 11/27/17 [phone]

## 2017-11-26 NOTE — ED Provider Notes (Addendum)
I saw and evaluated the patient, reviewed the resident's note and I agree with the findings and plan.  ED ECG REPORT   Date: 11/26/2017  Rate: 150  Rhythm: atrial flutter  QRS Axis: normal  Intervals: normal  ST/T Wave abnormalities: nonspecific ST changes  Conduction Disutrbances:none  Narrative Interpretation:   Old EKG Reviewed: none available  I have personally reviewed the EKG tracing and agree with the computerized printout as noted.    57 year old male here complaining of heart racing that has been persistent since 3 PM.  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  EKG shows A.  Flutter.  Patient given IV fluids and Cardizem and now is in flutter with controlled rate.  Patient also takes testosterone.  Unsure of when he first started having symptoms.  Will consult cardiology   CRITICAL CARE Performed by: Leota Jacobsen Total critical care time: 40 minutes Critical care time was exclusive of separately billable procedures and treating other patients. Critical care was necessary to treat or prevent imminent or life-threatening deterioration. Critical care was time spent personally by me on the following activities: development of treatment plan with patient and/or surrogate as well as nursing, discussions with consultants, evaluation of patient's response to treatment, examination of patient, obtaining history from patient or surrogate, ordering and performing treatments and interventions, ordering and review of laboratory studies, ordering and review of radiographic studies, pulse oximetry and re-evaluation of patient's condition.   Lacretia Leigh, MD 11/26/17 2227    Lacretia Leigh, MD 12/07/17 [phone]

## 2017-11-27 ENCOUNTER — Encounter (HOSPITAL_COMMUNITY): Payer: Self-pay | Admitting: Emergency Medicine

## 2017-11-27 ENCOUNTER — Encounter (HOSPITAL_COMMUNITY)
Admission: EM | Disposition: A | Discharge status: Home / Self Care | Payer: Self-pay | Source: Home / Self Care | Attending: Emergency Medicine

## 2017-11-27 ENCOUNTER — Other Ambulatory Visit: Payer: Self-pay

## 2017-11-27 ENCOUNTER — Observation Stay (HOSPITAL_COMMUNITY): Payer: 59 | Admitting: Certified Registered Nurse Anesthetist

## 2017-11-27 ENCOUNTER — Observation Stay (HOSPITAL_COMMUNITY): Payer: 59

## 2017-11-27 DIAGNOSIS — I081 Rheumatic disorders of both mitral and tricuspid valves: Secondary | ICD-10-CM | POA: Diagnosis not present

## 2017-11-27 DIAGNOSIS — D751 Secondary polycythemia: Secondary | ICD-10-CM | POA: Diagnosis not present

## 2017-11-27 DIAGNOSIS — I4892 Unspecified atrial flutter: Secondary | ICD-10-CM | POA: Diagnosis not present

## 2017-11-27 DIAGNOSIS — I10 Essential (primary) hypertension: Secondary | ICD-10-CM | POA: Diagnosis not present

## 2017-11-27 DIAGNOSIS — I428 Other cardiomyopathies: Secondary | ICD-10-CM | POA: Diagnosis not present

## 2017-11-27 DIAGNOSIS — R002 Palpitations: Secondary | ICD-10-CM | POA: Diagnosis not present

## 2017-11-27 HISTORY — PX: CARDIOVERSION: SHX1299

## 2017-11-27 HISTORY — PX: TEE WITHOUT CARDIOVERSION: SHX5443

## 2017-11-27 LAB — LIPID PANEL
Cholesterol: 159 mg/dL (ref 0–200)
HDL: 47 mg/dL (ref >40)
LDL Cholesterol: 99 mg/dL (ref 0–99)
TRIGLYCERIDES: 66 mg/dL (ref <150)
Total CHOL/HDL Ratio: 3.4 RATIO
VLDL: 13 mg/dL (ref 0–40)

## 2017-11-27 LAB — MRSA PCR SCREENING: MRSA BY PCR: NEGATIVE

## 2017-11-27 LAB — HIV ANTIBODY (ROUTINE TESTING W REFLEX): HIV Screen 4th Generation wRfx: NONREACTIVE

## 2017-11-27 LAB — TSH: TSH: 0.926 u[IU]/mL (ref 0.350–4.500)

## 2017-11-27 SURGERY — ECHOCARDIOGRAM, TRANSESOPHAGEAL
Anesthesia: General

## 2017-11-27 MED ORDER — BUTAMBEN-TETRACAINE-BENZOCAINE 2-2-14 % EX AERO
INHALATION_SPRAY | CUTANEOUS | Status: DC | PRN
  Administered 2017-11-27: 2 via TOPICAL

## 2017-11-27 MED ORDER — PROPOFOL 500 MG/50ML IV EMUL
INTRAVENOUS | Status: DC | PRN
  Administered 2017-11-27: 100 ug/kg/min via INTRAVENOUS

## 2017-11-27 MED ORDER — SODIUM CHLORIDE 0.9 % IV SOLN: INTRAVENOUS | Status: DC

## 2017-11-27 MED ORDER — PROPOFOL 10 MG/ML IV BOLUS
INTRAVENOUS | Status: DC | PRN
  Administered 2017-11-27 (×4): 10 mg via INTRAVENOUS

## 2017-11-27 MED ORDER — SODIUM CHLORIDE 0.9% FLUSH: 3.0000 mL | Freq: Two times a day (BID) | INTRAVENOUS | Status: DC

## 2017-11-27 MED ORDER — HYDROCHLOROTHIAZIDE 25 MG PO TABS
25.0000 mg | ORAL_TABLET | Freq: Every day | ORAL | Status: DC
  Administered 2017-11-27: 25 mg via ORAL
  Filled 2017-11-27: qty 1

## 2017-11-27 MED ORDER — APIXABAN 5 MG PO TABS
5.0000 mg | ORAL_TABLET | Freq: Two times a day (BID) | ORAL | Status: DC
  Administered 2017-11-27 (×2): 5 mg via ORAL
  Filled 2017-11-27 (×3): qty 1

## 2017-11-27 MED ORDER — SODIUM CHLORIDE 0.9% FLUSH: 3.0000 mL | INTRAVENOUS | Status: DC | PRN

## 2017-11-27 MED ORDER — LACTATED RINGERS IV SOLN
INTRAVENOUS | Status: DC | PRN
  Administered 2017-11-27: 14:00:00 via INTRAVENOUS

## 2017-11-27 MED ORDER — SODIUM CHLORIDE 0.9 % IV SOLN: 250.0000 mL | INTRAVENOUS | Status: DC

## 2017-11-27 MED ORDER — APIXABAN 5 MG PO TABS: 5.0000 mg | ORAL_TABLET | Freq: Two times a day (BID) | ORAL | 3 refills | Status: DC

## 2017-11-27 NOTE — H&P (Signed)
Cardiology History & Physical    Patient ID: [name] MRN: [id number], DOB: [birth year] Date of Encounter: 11/27/2017, 12:07 AM Primary Physician: Patient, No Pcp Per  Chief Complaint: Palpitations   HPI: Michael Choi is a 57 y.o. male with history of hypertension, secondary erythrocytosis, who presents with sensation of palpitations.  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  His heart rate was checked with the pulse oximeter, and was noted to be about 150.  This did not resolve after resting for some time, so the patient presented to urgent care for evaluation.  There his heart rate was continued to be elevated in the 150s.  He was sent to the [facility] ED for further evaluation.  In the ED, his heart rate upon presentation was 150, and the underlying rhythm was atrial flutter.  He received 10 mg of IV diltiazem and 2 L of intravenous fluids.  Initial labs showed creatinine of 1.5, hematocrit of 51, and no other notable findings.  His initial troponin was negative.  Following 1 IV push of diltiazem, his heart rates improved to the 80s to 100s, although still in flutter with variable block.  He was then admitted to the cardiology service for further management.  In retrospect, the patient does note that he has had several out episodes of palpitations over the past 3 to 6 months.  He has reportedly had a cardiac work-up with stress test, which he was told was normal.   Past Medical History:  Diagnosis Date  . Melanoma [facility])      Surgical History:  Past Surgical History:  Procedure Laterality Date  . KNEE ARTHROSCOPY       Home Meds: Prior to Admission medications   Medication Sig Start Date End Date Taking? Authorizing Provider  amLODipine (NORVASC) 5 MG tablet Take 5 mg by mouth daily.  12/31/16  Yes [provider]  aspirin EC 81 MG tablet Take 81 mg by mouth daily.   Yes [provider]  losartan (COZAAR) 50 MG tablet Take 50 mg by mouth daily. 01/20/17  Yes [provider]  multivitamin (THERAGRAN) per tablet Take 1 tablet by mouth daily.   Yes [provider]  testosterone cypionate (DEPOTESTOSTERONE CYPIONATE) 200 MG/ML injection Inject 3 mLs into the muscle every 14 (fourteen) days. 01/07/17  Yes [provider]  albuterol (PROVENTIL HFA;VENTOLIN HFA) 108 (90 Base) MCG/ACT inhaler Inhale 2 puffs into the lungs every 6 (six) hours as needed for wheezing or shortness of breath. Patient not taking: Reported on 11/26/2017 08/14/17   Daisy Blossom, MD    Allergies:  Allergies  Allergen Reactions  . Doxycycline Swelling  . Tetracycline Rash    Social History   Socioeconomic History  . Marital status: Married    Spouse name: Not on file  . Number of children: Not on file  . Years of education: Not on file  . Highest education level: Not on file  Occupational History  . Not on file  Social Needs  . Financial resource strain: Not on file  . Food insecurity:    Worry: Not on file    Inability: Not on file  . Transportation needs:    Medical: Not on file    Non-medical: Not on file  Tobacco Use  .  Smoking status: Never Smoker  . Smokeless tobacco: Never Used  Substance and Sexual Activity  . Alcohol use: No  . Drug use: No  . Sexual activity: Not on file  Lifestyle  . Physical activity:    Days per week: Not on file    Minutes per session: Not on file  . Stress: Not on file  Relationships  . Social connections:    Talks on phone: Not on file    Gets together: Not on file    Attends religious service: Not on file    Active member of club or organization: Not on file    Attends meetings of clubs or organizations: Not on file    Relationship status: Not on file  . Intimate partner violence:    Fear of current  or ex partner: Not on file    Emotionally abused: Not on file    Physically abused: Not on file    Forced sexual activity: Not on file  Other Topics Concern  . Not on file  Social History Narrative  . Not on file     No family history on file.  Review of Systems: All other systems reviewed and are otherwise negative except as noted above.  Labs:   Lab Results  Component Value Date   WBC 8.6 11/26/2017   HGB 17.3 (H) 11/26/2017   HCT 51.4 11/26/2017   MCV 93.5 11/26/2017   PLT 260 11/26/2017    Recent Labs  Lab 11/26/17 2003  NA 138  K 4.1  CL 103  CO2 26  BUN 13  CREATININE 1.49*  CALCIUM 8.9  GLUCOSE 100*   No results for input(s): CKTOTAL, CKMB, TROPONINI in the last 72 hours. No results found for: CHOL, HDL, LDLCALC, TRIG Lab Results  Component Value Date   DDIMER <0.27 01/27/2017    Radiology/Studies:  Dg Chest Port 1 View  Result Date: 11/26/2017 CLINICAL DATA:  Shortness of breath.  Possible chest pain. EXAM: PORTABLE CHEST 1 VIEW COMPARISON:  08/12/2017. FINDINGS: Cardiomegaly. Thoracic tortuosity. No consolidation or edema. No effusion or pneumothorax. No osseous findings. IMPRESSION: Cardiomegaly.  No active disease. Electronically Signed   By: Elsie Stain M.D.   On: 11/26/2017 20:53   Wt Readings from Last 3 Encounters:  11/26/17 108.9 kg (240 lb)  09/07/17 110.5 kg (243 lb 9.6 oz)  08/14/17 111.2 kg (245 lb 3.2 oz)    EKG: Atrial flutter (typical).  Physical Exam: Blood pressure (!) 139/102, pulse 88, temperature 97.8 F (36.6 C), temperature source Oral, resp. rate (!) 26, weight 108.9 kg (240 lb), SpO2 97 %. Body mass index is 32.55 kg/m. General: Well developed, well nourished, in no acute distress. Head: Normocephalic, atraumatic, sclera non-icteric, no xanthomas, nares are without discharge.  Neck: Negative for carotid bruits. JVD not elevated. Lungs: Clear bilaterally to auscultation without wheezes, rales, or rhonchi. Breathing is  unlabored. Heart: Cardiac, irregularly irregular, with S1 S2. No murmurs, rubs, or gallops appreciated. Abdomen: Soft, non-tender, non-distended with normoactive bowel sounds. No hepatomegaly. No rebound/guarding. No obvious abdominal masses. Msk:  Strength and tone appear normal for age. Extremities: No clubbing or cyanosis. No edema.  Distal pedal pulses are 2+ and equal bilaterally. Neuro: Alert and oriented X 3. No focal deficit. No facial asymmetry. Moves all extremities spontaneously. Psych:  Responds to questions appropriately with a normal affect.    Assessment and Plan  57 year old man who presents with typical atrial flutter.  1.  Atrial flutter: Responded well to IV  calcium channel blocker in the ED.  Plan to continue low-dose oral diltiazem for rate control.  Will start apixaban for anticoagulation.  If he remains in atrial flutter in the morning, will be reasonable to pursue TEE/cardioversion.  If he spontaneously converts with diltiazem, will plan to do a transthoracic echo to rule out underlying structural heart disease.  2.  Hypertension: Continue home losartan, holding amlodipine for now while adding diltiazem as above.  3.  Erythrocytosis: Hct 51 on presentation, likely in the setting of testosterone supplementation.  Signed, Esmond Plants, MD 11/27/2017, 12:07 AM

## 2017-11-27 NOTE — Interval H&P Note (Signed)
History and Physical Interval Note:  11/27/2017 2:08 PM  Michael Choi  has presented today for surgery, with the diagnosis of atrial fibrillation  The various methods of treatment have been discussed with the patient and family. After consideration of risks, benefits and other options for treatment, the patient has consented to  Procedure(s): TRANSESOPHAGEAL ECHOCARDIOGRAM (TEE) (N/A) CARDIOVERSION (N/A) as a surgical intervention .  The patient's history has been reviewed, patient examined, no change in status, stable for surgery.  I have reviewed the patient's chart and labs.  Questions were answered to the patient's satisfaction.     [place]

## 2017-11-27 NOTE — Progress Notes (Signed)
  Echocardiogram Echocardiogram Transesophageal has been performed.  Randa Lynn Bralen [place] 11/27/2017, 2:33 PM

## 2017-11-27 NOTE — H&P (Signed)
Michael Choi is an 58 y.o. male.   Chief Complaint: Palpitations HPI: Michael Choi  is a 57 y.o. male  With hypertension, hypogonadism and presently on testosterone supplements with secondary polycythemia, diagnosis of sleep apnea about 2 years ago, told to be mild and not on therapy, who is a Engineer, structural, was doing drills at the [employer] and he suddenly felt more short of breath, fluttering in his chest and tightness in his chest.  He was evaluated by his coworkers, was found to have a heart rate of 150 bpm, he was eventually evaluated at ED, was found to be in a flutter with RVR and he is now being admitted for further management of the same.  He states that he feels significantly improved with regard to palpitations but still feels some fluttering in his chest.  No chest pain.  No dyspnea or PND or orthopnea.  No leg edema.  States that over the past 6 months he has noticed on and off rapid heartbeat, associated with chest discomfort and shortness of breath but would last for few minutes at most an hour and would subside spontaneously.  He has been evaluated in the emergency room in the past for the same reasons but nothing concrete could be found.  But this time it was persistent.  Past Medical History:  Diagnosis Date  . Atrial flutter ([place])   . Hypertension   . Melanoma [facility])     Past Surgical History:  Procedure Laterality Date  . KNEE ARTHROSCOPY      Family History  Problem Relation Age of Onset  . Cancer Mother   . Heart disease Father        rheumatic heart disease s/p transplant  . Hypertension Brother    Social History:  reports that he has never smoked. He has never used smokeless tobacco. He reports that he does not drink alcohol or use drugs.  Allergies:  Allergies  Allergen Reactions  . Doxycycline Swelling  . Tetracycline Rash   Review of Systems  Constitutional: Negative.   HENT: Negative.   Eyes: Negative.   Respiratory: Positive for shortness of breath.  Negative for cough, hemoptysis, sputum production and wheezing.   Cardiovascular: Positive for chest pain and palpitations. Negative for orthopnea, claudication, leg swelling and PND.  Gastrointestinal: Negative.   Genitourinary: Negative.   Musculoskeletal: Negative.   Skin: Negative.   Neurological: Negative.   Endo/Heme/Allergies: Negative.   Psychiatric/Behavioral: Negative.   All other systems reviewed and are negative.   Blood pressure (!) 129/107, pulse 73, temperature 97.8 F (36.6 C), temperature source Oral, resp. rate 16, weight 108.9 kg (240 lb), SpO2 98 %. Body mass index is 32.55 kg/m.  Physical Exam  Constitutional: He is oriented to person, place, and time and well-developed, well-nourished, and in no distress. No distress.  Muscular and mildly obese in no acute distress.  HENT:  Head: Normocephalic and atraumatic.  Eyes: EOM are normal.  Neck: Normal range of motion. Neck supple. No JVD present. No tracheal deviation present. No thyromegaly present.  Cardiovascular:  S1 is variable, S2 is normal, there is no gallop or murmur.  Pulmonary/Chest: Effort normal and breath sounds normal.  Abdominal: Soft. Bowel sounds are normal.  Musculoskeletal: Normal range of motion. He exhibits no edema.  Lymphadenopathy:    He has no cervical adenopathy.  Neurological: He is alert and oriented to person, place, and time.  Skin: Skin is warm and dry. He is not diaphoretic.  Psychiatric: Affect normal.  Results for orders placed or performed during the hospital encounter of 11/26/17 (from the past 48 hour(s))  Basic metabolic panel     Status: Abnormal   Collection Time: 11/26/17  8:03 PM  Result Value Ref Range   Sodium 138 135 - 145 mmol/L   Potassium 4.1 3.5 - 5.1 mmol/L   Chloride 103 101 - 111 mmol/L   CO2 26 22 - 32 mmol/L   Glucose, Bld 100 (H) 65 - 99 mg/dL   BUN 13 6 - 20 mg/dL   Creatinine, Ser 1.49 (H) 0.61 - 1.24 mg/dL   Calcium 8.9 8.9 - 10.3 mg/dL   GFR  calc non Af Amer 50 (L) >60 mL/min   GFR calc Af Amer 58 (L) >60 mL/min    Comment: (NOTE) The eGFR has been calculated using the CKD EPI equation. This calculation has not been validated in all clinical situations. eGFR's persistently <60 mL/min signify possible Chronic Kidney Disease.    Anion gap 9 5 - 15    Comment: Performed at [facility] [address] [place], [place] [address]  CBC     Status: Abnormal   Collection Time: 11/26/17  8:03 PM  Result Value Ref Range   WBC 8.6 4.0 - 10.5 K/uL   RBC 5.50 4.22 - 5.81 MIL/uL   Hemoglobin 17.3 (H) 13.0 - 17.0 g/dL   HCT 51.4 39.0 - 52.0 %   MCV 93.5 78.0 - 100.0 fL   MCH 31.5 26.0 - 34.0 pg   MCHC 33.7 30.0 - 36.0 g/dL   RDW 13.8 11.5 - 15.5 %   Platelets 260 150 - 400 K/uL    Comment: Performed at [facility] [address] [place], [place] [address]  I-stat troponin, ED     Status: None   Collection Time: 11/26/17  8:16 PM  Result Value Ref Range   Troponin i, poc 0.04 0.00 - 0.08 ng/mL   Comment 3            Comment: Due to the release kinetics of cTnI, a negative result within the first hours of the onset of symptoms does not rule out myocardial infarction with certainty. If myocardial infarction is still suspected, repeat the test at appropriate intervals.     Labs:   Lab Results  Component Value Date   WBC 8.6 11/26/2017   HGB 17.3 (H) 11/26/2017   HCT 51.4 11/26/2017   MCV 93.5 11/26/2017   PLT 260 11/26/2017   Recent Labs  Lab 11/26/17 2003  NA 138  K 4.1  CL 103  CO2 26  BUN 13  CREATININE 1.49*  CALCIUM 8.9  GLUCOSE 100*    BNP (last 3 results) Recent Labs    01/27/17 1230  BNP 18.2    HEMOGLOBIN A1C No results found for: HGBA1C, MPG  Cardiac Panel (last 3 results) No results for input(s): CKTOTAL, CKMB, TROPONINI, RELINDX in the last 8760 hours.  No results found for: CKTOTAL, CKMB, CKMBINDEX, TROPONINI   TSH No results for input(s): TSH in the last 8760  hours.    (Not in a hospital admission)    Current Facility-Administered Medications:  .  0.9 %  sodium chloride infusion, 250 mL, Intravenous, Continuous, Adrian Prows, MD .  acetaminophen (TYLENOL) tablet 650 mg, 650 mg, Oral, Q4H PRN, Chakravartti, Jaidip, MD .  albuterol (PROVENTIL) (2.5 MG/3ML) 0.083% nebulizer solution 2.5 mg, 2.5 mg, Inhalation, Q6H PRN, Chakravartti, Jaidip, MD .  apixaban (ELIQUIS) tablet 5  mg, 5 mg, Oral, BID, Erenest Blank, RPH, 5 mg at 11/27/17 0115 .  aspirin EC tablet 81 mg, 81 mg, Oral, Daily, Chakravartti, Jaidip, MD .  diltiazem (CARDIZEM) tablet 30 mg, 30 mg, Oral, Q6H, Chakravartti, Jaidip, MD, 30 mg at 11/27/17 0611 .  losartan (COZAAR) tablet 50 mg, 50 mg, Oral, Daily, Chakravartti, Jaidip, MD .  nitroGLYCERIN (NITROSTAT) SL tablet 0.4 mg, 0.4 mg, Sublingual, Q5 Min x 3 PRN, Chakravartti, Jaidip, MD .  ondansetron (ZOFRAN) injection 4 mg, 4 mg, Intravenous, Q6H PRN, Chakravartti, Jaidip, MD .  sodium chloride flush (NS) 0.9 % injection 3 mL, 3 mL, Intravenous, Q12H, Adrian Prows, MD .  sodium chloride flush (NS) 0.9 % injection 3 mL, 3 mL, Intravenous, PRN, Adrian Prows, MD  Current Outpatient Medications:  .  amLODipine (NORVASC) 5 MG tablet, Take 5 mg by mouth daily., Disp: , Rfl: 3 .  aspirin EC 81 MG tablet, Take 81 mg by mouth daily., Disp: , Rfl:  .  losartan (COZAAR) 50 MG tablet, Take 50 mg by mouth daily., Disp: , Rfl: 3 .  multivitamin (THERAGRAN) per tablet, Take 1 tablet by mouth daily., Disp: , Rfl:  .  testosterone cypionate (DEPOTESTOSTERONE CYPIONATE) 200 MG/ML injection, Inject 3 mLs into the muscle every 14 (fourteen) days., Disp: , Rfl:  .  albuterol (PROVENTIL HFA;VENTOLIN HFA) 108 (90 Base) MCG/ACT inhaler, Inhale 2 puffs into the lungs every 6 (six) hours as needed for wheezing or shortness of breath. (Patient not taking: Reported on 11/26/2017), Disp: 1 Inhaler, Rfl: 2  CARDIAC STUDIES:  EKG 11/26/2017: Atrial flutter, typical  with variable ventricular response.  ECHO 01/22/2017: Normal LV size, moderate LVH, low-normal LVEF at 45 to 50% visually and calculated 53% with grade 1 aortic regurgitation.  Aortic root mildly dilated at 4.2 cm.  IVC dilated with respiratory variation, may suggest elevated right heart pressure.  Exercise sestamibi stress test 02/16/2017: Resting EKG demonstrated normal sinus rhythm, incomplete right bundle branch block.  Stress EKG was normal.  Patient exercised on Bruce protocol for 11 minutes and achieved 13.42 METS.  Stress terminated due to dyspnea and achieving 90% of MPHR.  Normal blood pressure response.  Left ventricle was mildly dilated with end-diastolic volume of 263 mL.  Homogeneous radionuclear tracer uptake without ischemia, LVEF calculated at 30%.  Findings may suggest nonischemic cardiomyopathy, high risk study.  Assessment/Plan 1.  Atrial flutter, typical with variable ventricular response.  Now rate is well controlled. 2.  Shortness of breath and dyspnea on exertion, probably related to atrial flutter, hypertension with hypertensive heart disease with moderate LVH and obesity also contributing. 3.  Obstructive sleep apnea, told to be mild 2 years ago, not on therapy. 4.  Hypertension  Recommendation: His blood pressure still not well controlled, I will change losartan to a higher dose along with HCT combination. We will obtain lipid profile testing.  New onset of atrial flutter less than 24 hours, however patient has symptoms ongoing for the past 6 months, to evaluate his LV function and also to exclude left atrial appendage and left atrial thrombus, best option is to proceed with TEE guided cardioversion.  She would be a good candidate for atrial flutter ablation, I will also discuss with EP.  I have discussed with the patient regarding the procedures in detail and risks and benefits including esophageal perforation, need for CPR, bleeding, trauma of less than 1% risk discussed.   Patient is willing to proceed.  Adrian Prows, MD 11/27/2017, 9:51 AM  [facility] [place] Pager: [phone] Office: [phone] If no answer: Cell:  [phone]

## 2017-11-27 NOTE — Progress Notes (Signed)
ANTICOAGULATION CONSULT NOTE - Initial Consult  Pharmacy Consult for Apixaban  Indication: atrial flutter  Allergies  Allergen Reactions  . Doxycycline Swelling  . Tetracycline Rash    Patient Measurements: Weight: 240 lb (108.9 kg)  Vital Signs: Temp: 97.8 F (36.6 C) (05/09 1955) Temp Source: Oral (05/09 1955) BP: 139/102 (05/09 2330) Pulse Rate: 88 (05/09 2330)  Labs: Recent Labs    11/26/17 2003  HGB 17.3*  HCT 51.4  PLT 260  CREATININE 1.49*    Estimated Creatinine Clearance: 69.7 mL/min (A) (by C-G formula based on SCr of 1.49 mg/dL (H)).   Medical History: Past Medical History:  Diagnosis Date  . Melanoma [facility])     Assessment: 57 y/o M presents to the ED with new onset atrial flutter, pt reports episodes of "palpitations" over the last 3-6 months. To start oral anti-coagulation with Apixaban. CBC good. Mild bump in Scr. No drug interactions identified.   Goal of Therapy:  Monitor platelets by anticoagulation protocol: Yes   Plan:  Apixaban 5 mg BID  Daily CBC Monitor for bleeding  Narda Bonds 11/27/2017,12:26 AM

## 2017-11-27 NOTE — ED Notes (Signed)
Attempted report to 2C.  

## 2017-11-27 NOTE — ED Notes (Signed)
Patient is sleeping  

## 2017-11-27 NOTE — ED Notes (Signed)
Pt's cardiac monitor, alarming multifocal PVCs, #15. Alarms read R on T PVCs, pairs PVCs, Vtach, non-sustained Vtach. Paged Dr. Lovena Le to noitify. Pt denies pain. Waiting for return call.

## 2017-11-27 NOTE — Discharge Summary (Signed)
Physician Discharge Summary  Patient ID: Michael Choi MRN: [id number] DOB/AGE: 1961/03/30 57 y.o.  Admit date: 11/26/2017 Discharge date: 11/27/2017  Admission Diagnoses: Atrial flutter  Discharge Diagnoses:  Principal Problem:   Atrial flutter ([place]) Active Problems:   Secondary erythrocytosis   HTN (hypertension)   Discharged Condition: stable  Hospital Course:   57 year old male with hypertension, hypogonadism on testosterone supplementation, admitted with typical atrial flutter.  He was treated with diltiazem overnight.  He continued to be in atrial flutter with RVR.  He underwent successful TEE and cardioversion with return to sinus rhythm with occasional PACs.  He was started on Eliquis 5 mg twice daily.  EP was consulted.  He will see them for follow-up for atrial flutter atrial flutter ablation procedure.  He is currently undergoing basically 1.  End training.  I encouraged him to avoid, training at this time.  Hopefully, he may be able to come off Eliquis in for 6 weeks.  Consults: EP  Significant Diagnostic Studies:  TEE: No thrombus  Treatments:  Successful cardioversion  Discharge Exam: Blood pressure 134/88, pulse 66, temperature 98.8 F (37.1 C), temperature source Oral, resp. rate 16, height 6' (1.829 m), weight 108.9 kg (240 lb), SpO2 95 %. Physical Exam  Constitutional: He appears well-developed and well-nourished.  Eyes: Pupils are equal, round, and reactive to light. Conjunctivae are normal.  Neck: Normal range of motion. Neck supple. No JVD present.  Cardiovascular:  No murmur heard. Bradycardia w/occasional ectopy  Pulmonary/Chest: Effort normal and breath sounds normal. He has no rales.  Abdominal: Soft. Bowel sounds are normal.  Musculoskeletal: Normal range of motion. He exhibits no edema.  Lymphadenopathy:    He has no cervical adenopathy.  Neurological: He is alert. No cranial nerve deficit.  Skin: Skin is warm and dry.  Nursing note and vitals  reviewed.    Disposition: Discharge disposition: 01-Home or Self Care       Discharge Instructions    Diet - low sodium heart healthy   Complete by:  As directed    Increase activity slowly   Complete by:  As directed      Allergies as of 11/27/2017      Reactions   Doxycycline Swelling   Tetracycline Rash      Medication List    STOP taking these medications   aspirin EC 81 MG tablet     TAKE these medications   albuterol 108 (90 Base) MCG/ACT inhaler Commonly known as:  PROVENTIL HFA;VENTOLIN HFA Inhale 2 puffs into the lungs every 6 (six) hours as needed for wheezing or shortness of breath.   amLODipine 5 MG tablet Commonly known as:  NORVASC Take 5 mg by mouth daily.   apixaban 5 MG Tabs tablet Commonly known as:  ELIQUIS Take 1 tablet (5 mg total) by mouth 2 (two) times daily.   losartan 50 MG tablet Commonly known as:  COZAAR Take 50 mg by mouth daily.   multivitamin per tablet Take 1 tablet by mouth daily.   testosterone cypionate 200 MG/ML injection Commonly known as:  DEPOTESTOSTERONE CYPIONATE Inject 3 mLs into the muscle every 14 (fourteen) days.        SignedNigel Mormon 11/27/2017, 3:28 PM  Aliya Sol Esther Hardy, MD [facility] [place] Pager: [phone] Office: [phone] If no answer Cell [phone]

## 2017-11-27 NOTE — Anesthesia Procedure Notes (Signed)
Procedure Name: MAC Date/Time: 11/27/2017 2:13 PM Performed by: Harden Mo, CRNA Pre-anesthesia Checklist: Patient identified, Emergency Drugs available, Suction available and Patient being monitored Patient Re-evaluated:Patient Re-evaluated prior to induction Oxygen Delivery Method: Nasal cannula Preoxygenation: Pre-oxygenation with 100% oxygen Induction Type: IV induction Placement Confirmation: positive ETCO2 and breath sounds checked- equal and bilateral Dental Injury: Teeth and Oropharynx as per pre-operative assessment

## 2017-11-27 NOTE — Care Management Note (Signed)
Case Management Note  Patient Details  Name: Michael Choi MRN: [id number] Date of Birth: [birth year]  Subjective/Objective:   Pt admitted with A flutter                 Action/Plan:  PTA independent from home with wife.  Pt has PCP.  Pt will discharge home on Eliquis - CM provided both free 30 day card and reduced copay card.  Pt not willing to wait for benefit check to be ran to determine if prior auth is required - CM informed pt to inquire about prior auth at pharmacy.  CM contacted pharmacy of choice to was informed that pharmacy can fill prescription today.   Expected Discharge Date:  11/27/17               Expected Discharge Plan:  Home/Self Care  In-House Referral:     Discharge planning Services  CM Consult  Post Acute Care Choice:    Choice offered to:     DME Arranged:    DME Agency:     HH Arranged:    HH Agency:     Status of Service:  Completed, signed off  If discussed at [employer] of Stay Meetings, dates discussed:    Additional Comments:  Maryclare Labrador, RN 11/27/2017, 4:01 PM

## 2017-11-27 NOTE — CV Procedure (Addendum)
TEE: Under moderate sedation, TEE was performed without complications: LV: Normal. LVEF 40-45% RV: Normal LA: Normal. Left atrial appendage: Normal without thrombus. Normal function. Inter atrial septum is intact without defect.  RA: Normal  MV: Normal Trace MR. TV: Normal Trace TR AV: Normal. No AI or AS. PV: Normal. Trace PI.  Transgastric views not obtained due to copious secretions.  Thoracic and ascending aorta: Normal without significant plaque or atheromatous changes.  Deep sedation administered and monitored by anesthesiology.  Direct current cardioversion:  Indication symptomatic A. flutter  Procedure:  Deep sedation administered and monitored by anesthesiology. Synchronized direct current cardioversion performed. Patient was delivered with 120 Joules of electricity X 1 with success to NSR. Patient tolerated the procedure well. No immediate complication noted.   [name], MD [facility] [place] Pager: [phone] Office: [phone] If no answer Cell [phone]

## 2017-11-27 NOTE — Progress Notes (Signed)
Discharge note. Patient and wife educated at bedside. RN educated on medications and when to take them, follow-up appointments, and diet and activity recommendations. PIVs removed without complications.   Pt discharged in wheelchair with NT.  Basil Dess, RN

## 2017-11-27 NOTE — Transfer of Care (Signed)
Immediate Anesthesia Transfer of Care Note  Patient: Michael Choi  Procedure(s) Performed: TRANSESOPHAGEAL ECHOCARDIOGRAM (TEE) (N/A ) CARDIOVERSION (N/A )  Patient Location: Endoscopy Unit  Anesthesia Type:MAC  Level of Consciousness: awake, alert  and oriented  Airway & Oxygen Therapy: Patient Spontanous Breathing and Patient connected to nasal cannula oxygen  Post-op Assessment: Report given to RN, Post -op Vital signs reviewed and stable and Patient moving all extremities X 4  Post vital signs: Reviewed and stable  Last Vitals:  Vitals Value Taken Time  BP    Temp    Pulse    Resp    SpO2      Last Pain:  Vitals:   11/27/17 1346  TempSrc:   PainSc: 0-No pain         Complications: No apparent anesthesia complications

## 2017-11-27 NOTE — Anesthesia Preprocedure Evaluation (Signed)
Anesthesia Evaluation  Patient identified by MRN, date of birth, ID band Patient awake    Reviewed: Allergy & Precautions, NPO status , Patient's Chart, lab work & pertinent test results  History of Anesthesia Complications Negative for: history of anesthetic complications  Airway Mallampati: II  TM Distance: >3 FB Neck ROM: Full    Dental  (+) Teeth Intact   Pulmonary neg pulmonary ROS,    breath sounds clear to auscultation       Cardiovascular hypertension, Pt. on medications + dysrhythmias Atrial Fibrillation  Rhythm:Irregular     Neuro/Psych negative neurological ROS  negative psych ROS   GI/Hepatic negative GI ROS, Neg liver ROS,   Endo/Other  negative endocrine ROS  Renal/GU Renal InsufficiencyRenal disease     Musculoskeletal negative musculoskeletal ROS (+)   Abdominal   Peds  Hematology negative hematology ROS (+)   Anesthesia Other Findings   Reproductive/Obstetrics                             Anesthesia Physical Anesthesia Plan  ASA: II  Anesthesia Plan: General   Post-op Pain Management:    Induction: Intravenous  PONV Risk Score and Plan: 2 and Treatment may vary due to age or medical condition  Airway Management Planned: Nasal Cannula  Additional Equipment: None  Intra-op Plan:   Post-operative Plan:   Informed Consent: I have reviewed the patients History and Physical, chart, labs and discussed the procedure including the risks, benefits and alternatives for the proposed anesthesia with the patient or authorized representative who has indicated his/her understanding and acceptance.   Dental advisory given  Plan Discussed with: CRNA and Surgeon  Anesthesia Plan Comments:         Anesthesia Quick Evaluation

## 2017-11-27 NOTE — Anesthesia Postprocedure Evaluation (Signed)
Anesthesia Post Note  Patient: Michael Choi  Procedure(s) Performed: TRANSESOPHAGEAL ECHOCARDIOGRAM (TEE) (N/A ) CARDIOVERSION (N/A )     Patient location during evaluation: Endoscopy Anesthesia Type: General Level of consciousness: awake and alert Pain management: pain level controlled Vital Signs Assessment: post-procedure vital signs reviewed and stable Respiratory status: spontaneous breathing, nonlabored ventilation, respiratory function stable and patient connected to nasal cannula oxygen Cardiovascular status: blood pressure returned to baseline and stable Postop Assessment: no apparent nausea or vomiting Anesthetic complications: no    Last Vitals:  Vitals:   11/27/17 1450 11/27/17 1500  BP: (!) 117/99 122/62  Pulse: 66 74  Resp: 20 18  Temp:    SpO2: 96% 98%    Last Pain:  Vitals:   11/27/17 1500  TempSrc:   PainSc: 0-No pain                 Naiomy Watters

## 2017-11-27 NOTE — ED Notes (Signed)
Dr. Nadyne Coombes at bedside who verbally acknowledged heart monitor alarms. Waiting for response.

## 2017-11-27 NOTE — Consult Note (Signed)
Cardiology Consultation:   Patient ID: Michael Choi; [id number]; 04/09/1961   Admit date: 11/26/2017 Date of Consult: 11/27/2017  Primary Care Provider: Patient, No Pcp Per Primary Cardiologist: Dr. Jacinto Halim    Patient Profile:   Michael Choi is a 57 y.o. male with a hx of HTN, hypogonadism on testosterone, secondary erythrocytosis, mild sleep apnea, not on CPAP,   who is being seen today for the evaluation of AFlutter at the request of Dr. Jacinto Halim.  History of Present Illness:   Michael Choi for about 6 month has had on occasion some symptoms of palpitations, he has difficulty describing them, almost as if he could scratch it it would go away.  On one prior occasion he sought attention at an ER with symptoms of pressure, perhaps fast heart rate, and unusually SOB with exertion, by the time he was seen he was feeling better and no abnormalities were found, he saw Dr. Jacinto Halim for his initial consultation back then in Oct 2018.  He underwent stress testing noted no ischemia, though EF 30%, his echo noted LVEF 45-50%.  He returns again with same symptoms.  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   Past Medical History:  Diagnosis Date  . Atrial flutter (HCC)   . Hypertension   . Melanoma [facility])     Past Surgical History:  Procedure Laterality Date  . KNEE ARTHROSCOPY         Inpatient Medications: Scheduled Meds: . apixaban  5 mg Oral BID    . aspirin EC  81 mg Oral Daily  . diltiazem  30 mg Oral Q6H  . hydrochlorothiazide  25 mg Oral Daily  . losartan  50 mg Oral Daily  . sodium chloride flush  3 mL Intravenous Q12H   Continuous Infusions: . sodium chloride     PRN Meds: acetaminophen, albuterol, nitroGLYCERIN, ondansetron (ZOFRAN) IV, sodium chloride flush  Allergies:    Allergies  Allergen Reactions  . Doxycycline Swelling  . Tetracycline Rash    Social History:   Social History   Socioeconomic History  . Marital status: Married    Spouse name: Not on file  . Number of children: Not on file  . Years of education: Not on file  . Highest education level: Not on file  Occupational History  . Not on file  Social Needs  . Financial resource strain: Not on file  . Food insecurity:    Worry: Not on file    Inability: Not on file  . Transportation needs:    Medical: Not on file    Non-medical: Not on file  Tobacco Use  . Smoking status: Never Smoker  . Smokeless tobacco: Never Used  Substance and Sexual Activity  . Alcohol use: No  . Drug use: No  . Sexual activity: Not on file  Lifestyle  . Physical activity:    Days per week:  Not on file    Minutes per session: Not on file  . Stress: Not on file  Relationships  . Social connections:    Talks on phone: Not on file    Gets together: Not on file    Attends religious service: Not on file    Active member of club or organization: Not on file    Attends meetings of clubs or organizations: Not on file    Relationship status: Not on file  . Intimate partner violence:    Fear of current or ex partner: Not on file    Emotionally abused: Not on file    Physically abused: Not on file    Forced sexual activity: Not on file  Other Topics Concern  . Not on file  Social History Narrative  . Not on file    Family History:    Family History  Problem Relation Age of Onset  . Cancer Mother   . Heart disease Father        rheumatic heart disease s/p  transplant  . Hypertension Brother      ROS:  Please see the history of present illness.  All other ROS reviewed and negative.     Physical Exam/Data:   Vitals:   11/27/17 0815 11/27/17 0830 11/27/17 0845 11/27/17 1019  BP: (!) 146/105 (!) 145/109 (!) 129/107 (!) 134/105  Pulse: 76 (!) 116 73 91  Resp: (!) 21 12 16 15   Temp:    97.6 F (36.4 C)  TempSrc:    Oral  SpO2: 98% 96% 98% 93%  Weight:      Height:    6' (1.829 m)    Intake/Output Summary (Last 24 hours) at 11/27/2017 1057 Last data filed at 11/27/2017 0619 Gross per 24 hour  Intake 2000 ml  Output 1450 ml  Net 550 ml   Filed Weights   11/26/17 1956  Weight: 240 lb (108.9 kg)   Body mass index is 32.55 kg/m.  General:  Well nourished, well developed, in no acute distress HEENT: normal Lymph: no adenopathy Neck: no JVD Endocrine:  No thryomegaly Vascular: No carotid bruits; FA pulses 2+ bilaterally without bruits  Cardiac:  iRRR; no murmurs, gallops or rubs Lungs: CTA b/l, no wheezing, rhonchi or rales  Abd: soft, nontender Ext: no edema Musculoskeletal:  No deformities Skin: warm and dry  Neuro:  no focal abnormalities noted Psych:  Normal affect   EKG:  The EKG was personally reviewed and demonstrates:   #1. AFlutter 149bpm #2. AFlutter, 78bpm, V rate 78bpm Telemetry:  Telemetry was personally reviewed and demonstrates:   AFlutter with variable rate control, 90's-110's at times faster to 130's still  Relevant CV Studies:  ECHO 01/22/2017: Normal LV size, moderate LVH, low-normal LVEF at 45 to 50% visually and calculated 53% with grade 1 aortic regurgitation.  Aortic root mildly dilated at 4.2 cm.  IVC dilated with respiratory variation, may suggest elevated right heart pressure.  Exercise sestamibi stress test 02/16/2017: Resting EKG demonstrated normal sinus rhythm, incomplete right bundle branch block.  Stress EKG was normal.  Patient exercised on Bruce protocol for 11 minutes and achieved 13.42  METS.  Stress terminated due to dyspnea and achieving 90% of MPHR.  Normal blood pressure response.  Left ventricle was mildly dilated with end-diastolic volume of 193 mL.  Homogeneous radionuclear tracer uptake without ischemia, LVEF calculated at 30%.  Findings may suggest nonischemic cardiomyopathy, high risk study.   Laboratory Data:  Chemistry Recent Labs  Lab  11/26/17 2003  NA 138  K 4.1  CL 103  CO2 26  GLUCOSE 100*  BUN 13  CREATININE 1.49*  CALCIUM 8.9  GFRNONAA 50*  GFRAA 58*  ANIONGAP 9    No results for input(s): PROT, ALBUMIN, AST, ALT, ALKPHOS, BILITOT in the last 168 hours. Hematology Recent Labs  Lab 11/26/17 2003  WBC 8.6  RBC 5.50  HGB 17.3*  HCT 51.4  MCV 93.5  MCH 31.5  MCHC 33.7  RDW 13.8  PLT 260   Cardiac EnzymesNo results for input(s): TROPONINI in the last 168 hours.  Recent Labs  Lab 11/26/17 2016  TROPIPOC 0.04    BNPNo results for input(s): BNP, PROBNP in the last 168 hours.  DDimer No results for input(s): DDIMER in the last 168 hours.  Radiology/Studies:   Dg Chest Port 1 View Result Date: 11/26/2017 CLINICAL DATA:  Shortness of breath.  Possible chest pain. EXAM: PORTABLE CHEST 1 VIEW COMPARISON:  08/12/2017. FINDINGS: Cardiomegaly. Thoracic tortuosity. No consolidation or edema. No effusion or pneumothorax. No osseous findings. IMPRESSION: Cardiomegaly.  No active disease. Electronically Signed   By: Elsie Stain M.D.   On: 11/26/2017 20:53    Assessment and Plan:   1. AFlutter     CHA2DS2Vasc is 1, started on Eliquis with plans for TEE/DCCV this afternoon  The patient would like to consider ablative strategies, would like to talk further with Dr. Graciela Husbands  2. HTN     Not well controlled     meds adjusted with primary cardiology  3. NICM     LVEF by echo 45-50%     ?tachy-mediated     Exam and CXR do not suggest fluid OL  4. Sleep apnea     He tells me he was in-fact recommended to get CPAP     Lengthy discussion  regarding the importance of sleep apnea treatment if indicated     He will re-visit with his PMD      For questions or updates, please contact [facility] Please consult [email or web address] for contact info under Cardiology/STEMI.   Signed, Sheilah Pigeon, PA-C  11/27/2017 10:57 AM

## 2017-11-29 ENCOUNTER — Encounter (HOSPITAL_COMMUNITY): Payer: Self-pay | Admitting: Cardiology

## 2017-11-30 ENCOUNTER — Other Ambulatory Visit: Payer: Self-pay

## 2017-11-30 DIAGNOSIS — I4892 Unspecified atrial flutter: Secondary | ICD-10-CM

## 2017-12-03 ENCOUNTER — Telehealth: Payer: Self-pay | Admitting: Hematology and Oncology

## 2017-12-03 ENCOUNTER — Encounter (HOSPITAL_COMMUNITY): Payer: Self-pay

## 2017-12-03 ENCOUNTER — Emergency Department (HOSPITAL_COMMUNITY): Payer: 59

## 2017-12-03 ENCOUNTER — Emergency Department (HOSPITAL_COMMUNITY)
Admission: EM | Admit: 2017-12-03 | Discharge: 2017-12-04 | Disposition: A | Discharge status: Home / Self Care | Payer: 59 | Attending: Emergency Medicine | Admitting: Emergency Medicine

## 2017-12-03 DIAGNOSIS — Z79899 Other long term (current) drug therapy: Secondary | ICD-10-CM | POA: Insufficient documentation

## 2017-12-03 DIAGNOSIS — I10 Essential (primary) hypertension: Secondary | ICD-10-CM | POA: Diagnosis not present

## 2017-12-03 DIAGNOSIS — R42 Dizziness and giddiness: Secondary | ICD-10-CM | POA: Diagnosis not present

## 2017-12-03 DIAGNOSIS — R0789 Other chest pain: Secondary | ICD-10-CM | POA: Diagnosis not present

## 2017-12-03 DIAGNOSIS — Z7901 Long term (current) use of anticoagulants: Secondary | ICD-10-CM | POA: Insufficient documentation

## 2017-12-03 DIAGNOSIS — I484 Atypical atrial flutter: Secondary | ICD-10-CM | POA: Diagnosis not present

## 2017-12-03 DIAGNOSIS — R002 Palpitations: Secondary | ICD-10-CM | POA: Diagnosis not present

## 2017-12-03 DIAGNOSIS — Z85820 Personal history of malignant melanoma of skin: Secondary | ICD-10-CM | POA: Diagnosis not present

## 2017-12-03 LAB — BASIC METABOLIC PANEL
Anion gap: 9 (ref 5–15)
BUN: 15 mg/dL (ref 6–20)
CHLORIDE: 106 mmol/L (ref 101–111)
CO2: 24 mmol/L (ref 22–32)
CREATININE: 1.3 mg/dL — AB (ref 0.61–1.24)
Calcium: 9.1 mg/dL (ref 8.9–10.3)
GFR calc non Af Amer: 59 mL/min — ABNORMAL LOW (ref >60)
GLUCOSE: 109 mg/dL — AB (ref 65–99)
Potassium: 4 mmol/L (ref 3.5–5.1)
Sodium: 139 mmol/L (ref 135–145)

## 2017-12-03 LAB — CBC
HCT: 53.6 % — ABNORMAL HIGH (ref 39.0–52.0)
Hemoglobin: 17.7 g/dL — ABNORMAL HIGH (ref 13.0–17.0)
MCH: 30.5 pg (ref 26.0–34.0)
MCHC: 33 g/dL (ref 30.0–36.0)
MCV: 92.4 fL (ref 78.0–100.0)
PLATELETS: 279 10*3/uL (ref 150–400)
RBC: 5.8 MIL/uL (ref 4.22–5.81)
RDW: 13.2 % (ref 11.5–15.5)
WBC: 10.7 10*3/uL — ABNORMAL HIGH (ref 4.0–10.5)

## 2017-12-03 LAB — I-STAT TROPONIN, ED: Troponin i, poc: 0.04 ng/mL (ref 0.00–0.08)

## 2017-12-03 NOTE — ED Triage Notes (Signed)
Pt seen last week in a flutter, cardioverted, last Friday, today around 5 pm HR began to get fast again. CP radiates to back, dizziness, denies nausea

## 2017-12-03 NOTE — Discharge Instructions (Addendum)
As discussed, it is important he follow-up with your cardiology team for further evaluation and management of your atrial flutter.  Return here for concerning changes in your condition.

## 2017-12-03 NOTE — Telephone Encounter (Signed)
Appointment for labs cancelled per 5/16 sch msg

## 2017-12-03 NOTE — ED Provider Notes (Addendum)
[facility] EMERGENCY DEPARTMENT Provider Note   CSN: [id number] Arrival date & time: 12/03/17  2154     History   Chief Complaint Chief Complaint  Patient presents with  . Chest Pain    HPI Michael Choi is a 57 y.o. male.  HPI  Patient presents due to chest pain. Patient has a history of atrial flutter, had electrical cardioversion about 1 week ago, and has had ongoing pain in his left posterior serratus area, since that time. However, today he developed some mild lightheadedness, and chest pressure patient notes that he completed a running assignment while in the [employer], and was feeling well, without appreciable tachycardia, but soon thereafter felt palpitations, and rapid heartbeat that persisted until just before my evaluation. No syncope, mild dyspnea, no intervention for relief. Currently the patient only complains of the similar pain is had for the past week since his cardioversion, no ongoing chest pain.   Past Medical History:  Diagnosis Date  . Atrial flutter ([place])   . Hypertension   . [facility])     Patient Active Problem List   Diagnosis Date Noted  . Atrial flutter ([place]) 11/26/2017  . Hypogonadism male 09/27/2017  . Secondary erythrocytosis 08/28/2017  . Prediabetes 04/03/2015  . HTN (hypertension) 10/13/2011  . Attention deficit hyperactivity disorder (ADHD) 04/02/2009    Past Surgical History:  Procedure Laterality Date  . CARDIOVERSION N/A 11/27/2017   Procedure: CARDIOVERSION;  Surgeon: [name], MD;  Location: [facility] ENDOSCOPY;  Service: Cardiovascular;  Laterality: N/A;  . KNEE ARTHROSCOPY    . TEE WITHOUT CARDIOVERSION N/A 11/27/2017   Procedure: TRANSESOPHAGEAL ECHOCARDIOGRAM (TEE);  Surgeon: [name], MD;  Location: [facility] ENDOSCOPY;  Service: Cardiovascular;  Laterality: N/A;        Home Medications    Prior to Admission medications   Medication Sig Start Date End Date Taking? Authorizing  Provider  albuterol (PROVENTIL HFA;VENTOLIN HFA) 108 (90 Base) MCG/ACT inhaler Inhale 2 puffs into the lungs every 6 (six) hours as needed for wheezing or shortness of breath. Patient not taking: Reported on 11/26/2017 08/14/17   Ardath Sax, MD  amLODipine (NORVASC) 5 MG tablet Take 5 mg by mouth daily. 12/31/16   [provider]  apixaban (ELIQUIS) 5 MG TABS tablet Take 1 tablet (5 mg total) by mouth 2 (two) times daily. 11/27/17   Patwardhan, Reynold Bowen, MD  losartan (COZAAR) 50 MG tablet Take 50 mg by mouth daily. 01/20/17   [provider]  multivitamin [facility]) per tablet Take 1 tablet by mouth daily.    [provider]  testosterone cypionate (DEPOTESTOSTERONE CYPIONATE) 200 MG/ML injection Inject 3 mLs into the muscle every 14 (fourteen) days. 01/07/17   [provider]    Family History Family History  Problem Relation Age of Onset  . Cancer Mother   . Heart disease Father        rheumatic heart disease s/p transplant  . Hypertension Brother     Social History Social History   Tobacco Use  . Smoking status: Never Smoker  . Smokeless tobacco: Never Used  Substance Use Topics  . Alcohol use: No  . Drug use: No     Allergies   Doxycycline and Tetracycline   Review of Systems Review of Systems  Constitutional:       Per HPI, otherwise negative  HENT:       Per HPI, otherwise negative  Respiratory:       Per HPI,  otherwise negative  Cardiovascular:       Per HPI, otherwise negative  Gastrointestinal: Negative for vomiting.  Endocrine:       Negative aside from HPI  Genitourinary:       Neg aside from HPI   Musculoskeletal:       Per HPI, otherwise negative  Skin: Negative.   Neurological: Negative for syncope.     Physical Exam Updated Vital Signs BP (!) 107/93   Pulse 69   Temp 97.6 F (36.4 C) (Oral)   Resp 20   SpO2 98%   Physical Exam  Constitutional: He is oriented to person, place, and time. He appears  well-developed. No distress.  HENT:  Head: Normocephalic and atraumatic.  Eyes: Conjunctivae and EOM are normal.  Cardiovascular: Normal rate and regular rhythm.  Pulmonary/Chest: Effort normal. No stridor. No respiratory distress.  Abdominal: He exhibits no distension.  Musculoskeletal: He exhibits no edema.  Neurological: He is alert and oriented to person, place, and time.  Skin: Skin is warm and dry.  Psychiatric: He has a normal mood and affect.  Nursing note and vitals reviewed.    ED Treatments / Results  Labs (all labs ordered are listed, but only abnormal results are displayed) Labs Reviewed  BASIC METABOLIC PANEL - Abnormal; Notable for the following components:      Result Value   Glucose, Bld 109 (*)    Creatinine, Ser 1.30 (*)    GFR calc non Af Amer 59 (*)    All other components within normal limits  CBC - Abnormal; Notable for the following components:   WBC 10.7 (*)    Hemoglobin 17.7 (*)    HCT 53.6 (*)    All other components within normal limits  I-STAT TROPONIN, ED    EKG EKG Interpretation  Date/Time:  Thursday Dec 03 2017 23:28:43 EDT Ventricular Rate:  61 PR Interval:    QRS Duration: 111 QT Interval:  401 QTC Calculation: 404 R Axis:   11 Text Interpretation:  Sinus rhythm Atrial premature complex Probable left atrial enlargement RSR' in V1 or V2, right VCD or RVH Confirmed by Carmin Muskrat [phone]) on 12/03/2017 11:55:36 PM   Radiology Dg Chest 2 View  Result Date: 12/03/2017 CLINICAL DATA:  Chest palpitation EXAM: CHEST - 2 VIEW COMPARISON:  11/26/2017 FINDINGS: Heart size upper normal. Both lungs are clear. The visualized skeletal structures are unremarkable. IMPRESSION: No active cardiopulmonary disease. Electronically Signed   By: Donavan Foil M.D.   On: 12/03/2017 22:53    Procedures Procedures (including critical care time)  Medications Ordered in ED Medications - No data to display   Initial Impression / Assessment and Plan  / ED Course  I have reviewed the triage vital signs and the nursing notes.  Pertinent labs & imaging results that were available during my care of the patient were reviewed by me and considered in my medical decision making (see chart for details). 11:55 PM  On repeat exam the patient is in similar condition, awake and alert, no recurrence of chest pain, nor tachycardia. I reviewed the patient's chart including documentation from his cardioversion last week, planned ablation in a few months after he is done with his [employer] course. With no evidence for sustained ischemia, resolution of his arrhythmia, and no ongoing complaints, with reassuring labs, vitals, the patient was discharged in stable condition to follow-up with cardiology.   Final Clinical Impressions(s) / ED Diagnoses  Atypical chest pain   Carmin Muskrat, MD  12/03/17 2356    Carmin Muskrat, MD 12/03/17 2356

## 2017-12-04 ENCOUNTER — Inpatient Hospital Stay: Payer: 59

## 2017-12-04 ENCOUNTER — Inpatient Hospital Stay: Payer: 59 | Attending: Hematology and Oncology | Admitting: Hematology and Oncology

## 2017-12-18 ENCOUNTER — Ambulatory Visit (INDEPENDENT_AMBULATORY_CARE_PROVIDER_SITE_OTHER): Payer: 59

## 2017-12-18 DIAGNOSIS — I4892 Unspecified atrial flutter: Secondary | ICD-10-CM | POA: Diagnosis not present

## 2017-12-19 DIAGNOSIS — I4892 Unspecified atrial flutter: Secondary | ICD-10-CM | POA: Diagnosis not present

## 2017-12-23 ENCOUNTER — Telehealth: Payer: Self-pay | Admitting: Cardiovascular Disease

## 2017-12-23 NOTE — Telephone Encounter (Signed)
Received call from [facility]. At least 2 episodes of A flutter/fib documented. HR 90s to 140s. Episodes lasting a few minutes.  Attempted to call patient using listed phone numbers - unable to connect with him.  Dr. Paticia Stack

## 2017-12-24 NOTE — Telephone Encounter (Addendum)
Received a monitor report on pt with a Artrial Flutter, wide complex beats rate 140 beats/minute lasting 60 seconds at 9:10 pm last night. Attempted to called pt. Left message on the listed mobile phone for pt to call back. Dr End DOD aware . MD verified that is A-flutter.

## 2018-01-01 ENCOUNTER — Emergency Department (HOSPITAL_COMMUNITY): Payer: 59

## 2018-01-01 ENCOUNTER — Other Ambulatory Visit: Payer: Self-pay

## 2018-01-01 ENCOUNTER — Emergency Department (HOSPITAL_COMMUNITY)
Admission: EM | Admit: 2018-01-01 | Discharge: 2018-01-01 | Disposition: A | Discharge status: Home / Self Care | Payer: 59 | Attending: Emergency Medicine | Admitting: Emergency Medicine

## 2018-01-01 ENCOUNTER — Encounter (HOSPITAL_COMMUNITY): Payer: Self-pay | Admitting: *Deleted

## 2018-01-01 DIAGNOSIS — I10 Essential (primary) hypertension: Secondary | ICD-10-CM | POA: Diagnosis not present

## 2018-01-01 DIAGNOSIS — Z79899 Other long term (current) drug therapy: Secondary | ICD-10-CM | POA: Insufficient documentation

## 2018-01-01 DIAGNOSIS — Z7901 Long term (current) use of anticoagulants: Secondary | ICD-10-CM | POA: Insufficient documentation

## 2018-01-01 DIAGNOSIS — I4891 Unspecified atrial fibrillation: Secondary | ICD-10-CM | POA: Diagnosis not present

## 2018-01-01 DIAGNOSIS — R002 Palpitations: Secondary | ICD-10-CM | POA: Diagnosis not present

## 2018-01-01 DIAGNOSIS — R0602 Shortness of breath: Secondary | ICD-10-CM | POA: Diagnosis not present

## 2018-01-01 DIAGNOSIS — I4892 Unspecified atrial flutter: Secondary | ICD-10-CM | POA: Diagnosis present

## 2018-01-01 DIAGNOSIS — J9811 Atelectasis: Secondary | ICD-10-CM | POA: Diagnosis not present

## 2018-01-01 LAB — BASIC METABOLIC PANEL
ANION GAP: 10 (ref 5–15)
BUN: 15 mg/dL (ref 6–20)
CO2: 25 mmol/L (ref 22–32)
Calcium: 8.9 mg/dL (ref 8.9–10.3)
Chloride: 106 mmol/L (ref 101–111)
Creatinine, Ser: 1.45 mg/dL — ABNORMAL HIGH (ref 0.61–1.24)
GFR calc Af Amer: >60 mL/min (ref >60)
GFR, EST NON AFRICAN AMERICAN: 52 mL/min — AB (ref >60)
Glucose, Bld: 141 mg/dL — ABNORMAL HIGH (ref 65–99)
POTASSIUM: 4.1 mmol/L (ref 3.5–5.1)
SODIUM: 141 mmol/L (ref 135–145)

## 2018-01-01 LAB — CBC
HEMATOCRIT: 53.7 % — AB (ref 39.0–52.0)
Hemoglobin: 17.7 g/dL — ABNORMAL HIGH (ref 13.0–17.0)
MCH: 30.9 pg (ref 26.0–34.0)
MCHC: 33 g/dL (ref 30.0–36.0)
MCV: 93.9 fL (ref 78.0–100.0)
Platelets: 258 10*3/uL (ref 150–400)
RBC: 5.72 MIL/uL (ref 4.22–5.81)
RDW: 13.3 % (ref 11.5–15.5)
WBC: 9.5 10*3/uL (ref 4.0–10.5)

## 2018-01-01 LAB — I-STAT TROPONIN, ED: Troponin i, poc: 0.03 ng/mL (ref 0.00–0.08)

## 2018-01-01 MED ORDER — SODIUM CHLORIDE 0.9 % IV SOLN: INTRAVENOUS | Status: DC

## 2018-01-01 NOTE — Discharge Instructions (Addendum)
Return for rapid heart rate lasting an hour or longer.  Or for the development of any significant chest pain.  Call cardiology for follow-up.

## 2018-01-01 NOTE — ED Triage Notes (Addendum)
Pt had a cardioversion a month ago for Aflutter, is currently wearing a halter monitor. Today felt like his aflutter start again with shakiness, denies sob. Pt is due in August for an ablation. Pt is on Eliquis, has been having headaches and increased pulse after taking medication Heart rate 145

## 2018-01-01 NOTE — ED Provider Notes (Signed)
[facility] EMERGENCY DEPARTMENT Provider Note   CSN: [id number] Arrival date & time: 01/01/18  2125     History   Chief Complaint Chief Complaint  Patient presents with  . Atrial Flutter    HPI Michael Choi is a 57 y.o. male.  Patient with known history of atrial flutter.  Followed by Dr. [name].  Initially diagnosed about a month ago.  Patient is on Eliquis.  Did have cardioversion in the past.  Is scheduled for ablation in August.  Patient came in because he felt as if his heart rate had been going fast he is wearing a Holter monitor.  Had no real chest pain did have some shortness of breath with exertion.  He was getting heart rates around the 145's.     Past Medical History:  Diagnosis Date  . Atrial flutter ([place])   . Hypertension   . Melanoma [facility])     Patient Active Problem List   Diagnosis Date Noted  . Atrial flutter ([place]) 11/26/2017  . Hypogonadism male 09/27/2017  . Secondary erythrocytosis 08/28/2017  . Prediabetes 04/03/2015  . HTN (hypertension) 10/13/2011  . Attention deficit hyperactivity disorder (ADHD) 04/02/2009    Past Surgical History:  Procedure Laterality Date  . CARDIOVERSION N/A 11/27/2017   Procedure: CARDIOVERSION;  Surgeon: [name], MD;  Location: [facility] ENDOSCOPY;  Service: Cardiovascular;  Laterality: N/A;  . KNEE ARTHROSCOPY    . TEE WITHOUT CARDIOVERSION N/A 11/27/2017   Procedure: TRANSESOPHAGEAL ECHOCARDIOGRAM (TEE);  Surgeon: [name], MD;  Location: [facility] ENDOSCOPY;  Service: Cardiovascular;  Laterality: N/A;        Home Medications    Prior to Admission medications   Medication Sig Start Date End Date Taking? Authorizing Provider  albuterol (PROVENTIL HFA;VENTOLIN HFA) 108 (90 Base) MCG/ACT inhaler Inhale 2 puffs into the lungs every 6 (six) hours as needed for wheezing or shortness of breath. Patient not taking: Reported on 11/26/2017 08/14/17   Ardath Sax, MD  amLODipine (NORVASC) 5 MG  tablet Take 5 mg by mouth daily. 12/31/16   [provider]  apixaban (ELIQUIS) 5 MG TABS tablet Take 1 tablet (5 mg total) by mouth 2 (two) times daily. 11/27/17   Patwardhan, Reynold Bowen, MD  losartan (COZAAR) 50 MG tablet Take 50 mg by mouth daily. 01/20/17   [provider]  multivitamin [facility]) per tablet Take 1 tablet by mouth daily.    [provider]  testosterone cypionate (DEPOTESTOSTERONE CYPIONATE) 200 MG/ML injection Inject 3 mLs into the muscle every 14 (fourteen) days. 01/07/17   [provider]    Family History Family History  Problem Relation Age of Onset  . Cancer Mother   . Heart disease Father        rheumatic heart disease s/p transplant  . Hypertension Brother     Social History Social History   Tobacco Use  . Smoking status: Never Smoker  . Smokeless tobacco: Never Used  Substance Use Topics  . Alcohol use: No  . Drug use: No     Allergies   Doxycycline and Tetracycline   Review of Systems Review of Systems  Constitutional: Negative for fever.  HENT: Negative for congestion.   Eyes: Negative for visual disturbance.  Respiratory: Positive for shortness of breath.   Cardiovascular: Positive for palpitations. Negative for chest pain.  Gastrointestinal: Negative for abdominal pain.  Genitourinary: Negative for dysuria.  Musculoskeletal: Negative for back pain.  Skin: Negative for rash.  Neurological: Negative for  syncope and headaches.  Hematological: Does not bruise/bleed easily.  Psychiatric/Behavioral: Negative for confusion.     Physical Exam Updated Vital Signs BP (!) 117/105   Pulse 73   Temp 98.1 F (36.7 C)   Resp 19   SpO2 96%   Physical Exam  Constitutional: He is oriented to person, place, and time. He appears well-developed and well-nourished. No distress.  HENT:  Head: Normocephalic and atraumatic.  Mouth/Throat: Oropharynx is clear and moist.  Eyes: Pupils are equal, round, and reactive to  light. Conjunctivae and EOM are normal.  Neck: Normal range of motion. Neck supple.  Cardiovascular: Normal heart sounds.  Tachycardic and irregular  Pulmonary/Chest: Effort normal and breath sounds normal. No respiratory distress. He has no wheezes. He has no rales.  Abdominal: Soft. Bowel sounds are normal. There is no tenderness.  Musculoskeletal: Normal range of motion. He exhibits no edema.  Neurological: He is alert and oriented to person, place, and time. No cranial nerve deficit or sensory deficit. He exhibits normal muscle tone. Coordination normal.  Skin: Skin is warm. No rash noted.  Nursing note and vitals reviewed.    ED Treatments / Results  Labs (all labs ordered are listed, but only abnormal results are displayed) Labs Reviewed  BASIC METABOLIC PANEL - Abnormal; Notable for the following components:      Result Value   Glucose, Bld 141 (*)    Creatinine, Ser 1.45 (*)    GFR calc non Af Amer 52 (*)    All other components within normal limits  CBC - Abnormal; Notable for the following components:   Hemoglobin 17.7 (*)    HCT 53.7 (*)    All other components within normal limits  I-STAT TROPONIN, ED    EKG EKG Interpretation  Date/Time:  Friday January 01 2018 21:30:21 EDT Ventricular Rate:  143 PR Interval:    QRS Duration: 84 QT Interval:  262 QTC Calculation: 404 R Axis:   56 Text Interpretation:  Supraventricular tachycardia ST elevation consider inferior injury or acute infarct Abnormal ECG Confirmed by Fredia Sorrow [phone]) on 01/01/2018 9:49:35 PM   Radiology Dg Chest 2 View  Result Date: 01/01/2018 CLINICAL DATA:  Cardioversion month ago.  Palpitations. EXAM: CHEST - 2 VIEW COMPARISON:  Dec 03, 2017 FINDINGS: Mild atelectasis in the left base. Mild cardiomegaly. The hila and mediastinum are normal. No pneumothorax. No nodules or masses. No focal infiltrates. No overt edema. IMPRESSION: No active cardiopulmonary disease. Electronically Signed   By:  Dorise Bullion III M.D   On: 01/01/2018 22:24    Procedures Procedures (including critical care time)  Medications Ordered in ED Medications  0.9 %  sodium chloride infusion (has no administration in time range)     Initial Impression / Assessment and Plan / ED Course  I have reviewed the triage vital signs and the nursing notes.  Pertinent labs & imaging results that were available during my care of the patient were reviewed by me and considered in my medical decision making (see chart for details).     Patient put on cardiac monitor here.  Heart rate initially in the 140s atrial fibrillation.  Patient chest x-ray negative labs without significant abnormalities.  Patient has been taking his Eliquis.  Has not missed a dose.  Initial plan was to go ahead and do cardioversion and then patient's heart rate started to do improved significantly and came down into the 80s.  Then can state anywhere from the 80s to 90s still was  a atrial flutter but it was controlled.  Based on this will have patient continue through his heart monitor to monitor events if heart rate gets fast and stays fast to have him return.  Not recommend cardioversion at this time.  Patient overall feels much better.  Patient troponin was negative as well.  Will have patient follow-up with cardiology.  Final Clinical Impressions(s) / ED Diagnoses   Final diagnoses:  Atrial fibrillation with RVR [facility]    ED Discharge Orders    None       Fredia Sorrow, MD 01/01/18 2340

## 2018-01-15 ENCOUNTER — Telehealth: Payer: Self-pay | Admitting: Internal Medicine

## 2018-01-15 NOTE — Telephone Encounter (Signed)
Pt decided will stay on Eliquis for time being the patient has an appt with Dr [name] to discuss ablation in August ./cy

## 2018-01-15 NOTE — Telephone Encounter (Signed)
New Message:      Pt c/o medication issue:  1. Name of Medication: apixaban (ELIQUIS) 5 MG TABS tablet  2. How are you currently taking this medication (dosage and times per day)? Take 1 tablet (5 mg total) by mouth 2 (two) times daily.  3. Are you having a reaction (difficulty breathing--STAT)? No  4. What is your medication issue? Pt is asking if he can be prescribed a cheaper blood thinner

## 2018-02-15 DIAGNOSIS — H109 Unspecified conjunctivitis: Secondary | ICD-10-CM | POA: Diagnosis not present

## 2018-02-24 DIAGNOSIS — B351 Tinea unguium: Secondary | ICD-10-CM | POA: Diagnosis not present

## 2018-02-24 DIAGNOSIS — T148XXA Other injury of unspecified body region, initial encounter: Secondary | ICD-10-CM | POA: Diagnosis not present

## 2018-02-24 DIAGNOSIS — L0291 Cutaneous abscess, unspecified: Secondary | ICD-10-CM | POA: Diagnosis not present

## 2018-02-25 DIAGNOSIS — B351 Tinea unguium: Secondary | ICD-10-CM | POA: Diagnosis not present

## 2018-03-01 NOTE — Progress Notes (Signed)
      Patient Care Team: [name], MD as PCP - General (Family Medicine)   HPI  Michael Choi is a 57 y.o. male Seen in followup for atrial flutter for which he was eval [facility] 5/19.  He was cardioverted.  Has OSA, previously untreated.   He has recurrent tachypalpitations and exercise intolerance.  No edema.  He was exposed to an antibiotic in the last couple of days associated with some irritation to his mouth.  This is abating.  DATE TEST EF   5/19 Echo   45-50 %   5/19 TEE  30-35 %         Thromboembolic risk factors (, HTN-1, CHF-1) for a CHADSVASc Score of 2   Records and Results Reviewed   Past Medical History:  Diagnosis Date  . Atrial flutter ([place])   . Hypertension   . Melanoma [facility])     Past Surgical History:  Procedure Laterality Date  . CARDIOVERSION N/A 11/27/2017   Procedure: CARDIOVERSION;  Surgeon: [name], MD;  Location: [facility] ENDOSCOPY;  Service: Cardiovascular;  Laterality: N/A;  . KNEE ARTHROSCOPY    . TEE WITHOUT CARDIOVERSION N/A 11/27/2017   Procedure: TRANSESOPHAGEAL ECHOCARDIOGRAM (TEE);  Surgeon: [name], MD;  Location: [facility] ENDOSCOPY;  Service: Cardiovascular;  Laterality: N/A;    Current Meds  Medication Sig  . albuterol (PROVENTIL HFA;VENTOLIN HFA) 108 (90 Base) MCG/ACT inhaler Inhale 2 puffs into the lungs every 6 (six) hours as needed for wheezing or shortness of breath.  Marland Kitchen amLODipine (NORVASC) 5 MG tablet Take 5 mg by mouth daily.  Marland Kitchen apixaban (ELIQUIS) 5 MG TABS tablet Take 1 tablet (5 mg total) by mouth 2 (two) times daily.  Marland Kitchen losartan (COZAAR) 50 MG tablet Take 50 mg by mouth daily.  . multivitamin (THERAGRAN) per tablet Take 1 tablet by mouth daily.  . nitroGLYCERIN (NITROSTAT) 0.4 MG SL tablet Place 1 tablet under the tongue as needed for chest pain.  Marland Kitchen testosterone cypionate (DEPOTESTOSTERONE CYPIONATE) 200 MG/ML injection Inject 3 mLs into the muscle every 14 (fourteen) days.    Allergies  Allergen  Reactions  . Doxycycline Swelling  . Tetracycline Rash      Review of Systems negative except from HPI and PMH  Physical Exam BP (!) 148/82   Pulse (!) 151   Ht 6' (1.829 m)   Wt 238 lb 6.4 oz (108.1 kg)   SpO2 98%   BMI 32.33 kg/m  Well developed and well nourished in no acute distress HENT normal E scleral and icterus clear Neck Supple JVP flat; carotids brisk and full Clear to ausculation Rapid but Regular rate and rhythm, no murmurs gallops or rub Soft with active bowel sounds No clubbing cyanosis  edema Alert and oriented, grossly normal motor and sensory function Skin Warm and Dry  Aflutter 151 Typical appearance.  Assessment and  Plan Atrial Flutter  Nonischemic cardiomyopathy  Hypertension  For catheter ablation   Will do with general anesthesiology  Will need to reassess LV function afterwards.  Drug reaction earlier this week with some degree of oral irritation/glossitis.  We will follow this may require rescheduling on Friday but think will probably be better by then.  We have discussed risks and benefits including but not limited to heart block perforation vascular injury and death   Current medicines are reviewed at length with the patient today .  The patient does not have concerns regarding medicines.

## 2018-03-01 NOTE — H&P (View-Only) (Signed)
      Patient Care Team: [name], MD as PCP - General (Family Medicine)   HPI  Michael Choi is a 56 y.o. male Seen in followup for atrial flutter for which he was eval [facility] 5/19.  He was cardioverted.  Has OSA, previously untreated.   He has recurrent tachypalpitations and exercise intolerance.  No edema.  He was exposed to an antibiotic in the last couple of days associated with some irritation to his mouth.  This is abating.  DATE TEST EF   5/19 Echo   45-50 %   5/19 TEE  30-35 %         Thromboembolic risk factors (, HTN-1, CHF-1) for a CHADSVASc Score of 2   Records and Results Reviewed   Past Medical History:  Diagnosis Date  . Atrial flutter ([place])   . Hypertension   . Melanoma [facility])     Past Surgical History:  Procedure Laterality Date  . CARDIOVERSION N/A 11/27/2017   Procedure: CARDIOVERSION;  Surgeon: [name], MD;  Location: [facility] ENDOSCOPY;  Service: Cardiovascular;  Laterality: N/A;  . KNEE ARTHROSCOPY    . TEE WITHOUT CARDIOVERSION N/A 11/27/2017   Procedure: TRANSESOPHAGEAL ECHOCARDIOGRAM (TEE);  Surgeon: [name], MD;  Location: [facility] ENDOSCOPY;  Service: Cardiovascular;  Laterality: N/A;    Current Meds  Medication Sig  . albuterol (PROVENTIL HFA;VENTOLIN HFA) 108 (90 Base) MCG/ACT inhaler Inhale 2 puffs into the lungs every 6 (six) hours as needed for wheezing or shortness of breath.  Marland Kitchen amLODipine (NORVASC) 5 MG tablet Take 5 mg by mouth daily.  Marland Kitchen apixaban (ELIQUIS) 5 MG TABS tablet Take 1 tablet (5 mg total) by mouth 2 (two) times daily.  Marland Kitchen losartan (COZAAR) 50 MG tablet Take 50 mg by mouth daily.  . multivitamin (THERAGRAN) per tablet Take 1 tablet by mouth daily.  . nitroGLYCERIN (NITROSTAT) 0.4 MG SL tablet Place 1 tablet under the tongue as needed for chest pain.  Marland Kitchen testosterone cypionate (DEPOTESTOSTERONE CYPIONATE) 200 MG/ML injection Inject 3 mLs into the muscle every 14 (fourteen) days.    Allergies  Allergen  Reactions  . Doxycycline Swelling  . Tetracycline Rash      Review of Systems negative except from HPI and PMH  Physical Exam BP (!) 148/82   Pulse (!) 151   Ht 6' (1.829 m)   Wt 238 lb 6.4 oz (108.1 kg)   SpO2 98%   BMI 32.33 kg/m  Well developed and well nourished in no acute distress HENT normal E scleral and icterus clear Neck Supple JVP flat; carotids brisk and full Clear to ausculation Rapid but Regular rate and rhythm, no murmurs gallops or rub Soft with active bowel sounds No clubbing cyanosis  edema Alert and oriented, grossly normal motor and sensory function Skin Warm and Dry  Aflutter 151 Typical appearance.  Assessment and  Plan Atrial Flutter  Nonischemic cardiomyopathy  Hypertension  For catheter ablation   Will do with general anesthesiology  Will need to reassess LV function afterwards.  Drug reaction earlier this week with some degree of oral irritation/glossitis.  We will follow this may require rescheduling on Friday but think will probably be better by then.  We have discussed risks and benefits including but not limited to heart block perforation vascular injury and death   Current medicines are reviewed at length with the patient today .  The patient does not have concerns regarding medicines.

## 2018-03-02 ENCOUNTER — Ambulatory Visit: Payer: 59 | Admitting: Internal Medicine

## 2018-03-02 ENCOUNTER — Encounter: Payer: Self-pay | Admitting: Internal Medicine

## 2018-03-02 VITALS — BP 148/82 | HR 151 | Ht 72.0 in | Wt 238.4 lb

## 2018-03-02 DIAGNOSIS — I4892 Unspecified atrial flutter: Secondary | ICD-10-CM | POA: Diagnosis not present

## 2018-03-02 DIAGNOSIS — L03119 Cellulitis of unspecified part of limb: Secondary | ICD-10-CM | POA: Diagnosis not present

## 2018-03-02 NOTE — Patient Instructions (Signed)
Medication Instructions:  Your physician recommends that you continue on your current medications as directed. Please refer to the Current Medication list given to you today.  Labwork: You will have labs drawn today: CBC and BMP  Testing/Procedures: None ordered.  Follow-Up: Your physician recommends that you schedule a follow-up appointment in:   4 weeks with Dr [name]  Any Other Special Instructions Will Be Listed Below (If Applicable).     If you need a refill on your cardiac medications before your next appointment, please call your pharmacy.

## 2018-03-02 NOTE — Addendum Note (Signed)
Addended by: Dollene Primrose on: 03/02/2018 03:04 PM   Modules accepted: Orders

## 2018-03-03 LAB — BASIC METABOLIC PANEL
BUN/Creatinine Ratio: 12 (ref 9–20)
BUN: 15 mg/dL (ref 6–24)
CALCIUM: 9.7 mg/dL (ref 8.7–10.2)
CO2: 21 mmol/L (ref 20–29)
CREATININE: 1.29 mg/dL — AB (ref 0.76–1.27)
Chloride: 104 mmol/L (ref 96–106)
GFR calc Af Amer: 71 mL/min/{1.73_m2} (ref >59)
GFR, EST NON AFRICAN AMERICAN: 61 mL/min/{1.73_m2} (ref >59)
GLUCOSE: 81 mg/dL (ref 65–99)
Potassium: 5 mmol/L (ref 3.5–5.2)
SODIUM: 142 mmol/L (ref 134–144)

## 2018-03-03 LAB — CBC WITH DIFFERENTIAL/PLATELET
BASOS: 1 %
Basophils Absolute: 0.1 10*3/uL (ref 0.0–0.2)
EOS (ABSOLUTE): 0.1 10*3/uL (ref 0.0–0.4)
EOS: 1 %
HEMATOCRIT: 53.3 % — AB (ref 37.5–51.0)
Hemoglobin: 17.9 g/dL — ABNORMAL HIGH (ref 13.0–17.7)
IMMATURE GRANS (ABS): 0 10*3/uL (ref 0.0–0.1)
Immature Granulocytes: 0 %
LYMPHS: 22 %
Lymphocytes Absolute: 2.4 10*3/uL (ref 0.7–3.1)
MCH: 30.6 pg (ref 26.6–33.0)
MCHC: 33.6 g/dL (ref 31.5–35.7)
MCV: 91 fL (ref 79–97)
MONOS ABS: 1.4 10*3/uL — AB (ref 0.1–0.9)
Monocytes: 12 %
Neutrophils Absolute: 7 10*3/uL (ref 1.4–7.0)
Neutrophils: 64 %
PLATELETS: 295 10*3/uL (ref 150–450)
RBC: 5.85 x10E6/uL — AB (ref 4.14–5.80)
RDW: 13 % (ref 12.3–15.4)
WBC: 11 10*3/uL — AB (ref 3.4–10.8)

## 2018-03-05 ENCOUNTER — Encounter (HOSPITAL_COMMUNITY)
Admission: RE | Disposition: A | Discharge status: Home / Self Care | Payer: Self-pay | Source: Ambulatory Visit | Attending: Internal Medicine

## 2018-03-05 ENCOUNTER — Ambulatory Visit (HOSPITAL_COMMUNITY): Payer: 59 | Admitting: Anesthesiology

## 2018-03-05 ENCOUNTER — Other Ambulatory Visit: Payer: Self-pay

## 2018-03-05 ENCOUNTER — Encounter (HOSPITAL_COMMUNITY): Payer: Self-pay | Admitting: Certified Registered Nurse Anesthetist

## 2018-03-05 ENCOUNTER — Ambulatory Visit (HOSPITAL_COMMUNITY)
Admission: RE | Admit: 2018-03-05 | Discharge: 2018-03-05 | Disposition: A | Discharge status: Home / Self Care | Payer: 59 | Source: Ambulatory Visit | Attending: Internal Medicine | Admitting: Internal Medicine

## 2018-03-05 DIAGNOSIS — Z7901 Long term (current) use of anticoagulants: Secondary | ICD-10-CM | POA: Insufficient documentation

## 2018-03-05 DIAGNOSIS — G4730 Sleep apnea, unspecified: Secondary | ICD-10-CM | POA: Insufficient documentation

## 2018-03-05 DIAGNOSIS — I428 Other cardiomyopathies: Secondary | ICD-10-CM | POA: Insufficient documentation

## 2018-03-05 DIAGNOSIS — I110 Hypertensive heart disease with heart failure: Secondary | ICD-10-CM | POA: Insufficient documentation

## 2018-03-05 DIAGNOSIS — Z6832 Body mass index (BMI) 32.0-32.9, adult: Secondary | ICD-10-CM | POA: Diagnosis not present

## 2018-03-05 DIAGNOSIS — I509 Heart failure, unspecified: Secondary | ICD-10-CM | POA: Insufficient documentation

## 2018-03-05 DIAGNOSIS — E669 Obesity, unspecified: Secondary | ICD-10-CM | POA: Diagnosis not present

## 2018-03-05 DIAGNOSIS — I4892 Unspecified atrial flutter: Secondary | ICD-10-CM | POA: Diagnosis present

## 2018-03-05 HISTORY — PX: A-FLUTTER ABLATION: EP1230

## 2018-03-05 SURGERY — A-FLUTTER ABLATION
Anesthesia: General

## 2018-03-05 MED ORDER — SODIUM CHLORIDE 0.9% FLUSH
3.0000 mL | Freq: Two times a day (BID) | INTRAVENOUS | Status: DC
  Administered 2018-03-05: 3 mL via INTRAVENOUS

## 2018-03-05 MED ORDER — MIDAZOLAM HCL 5 MG/5ML IJ SOLN
INTRAMUSCULAR | Status: DC | PRN
  Administered 2018-03-05: 2 mg via INTRAVENOUS

## 2018-03-05 MED ORDER — PHENYLEPHRINE HCL 10 MG/ML IJ SOLN
INTRAMUSCULAR | Status: DC | PRN
  Administered 2018-03-05: 40 ug via INTRAVENOUS
  Administered 2018-03-05: 120 ug via INTRAVENOUS
  Administered 2018-03-05 (×3): 80 ug via INTRAVENOUS

## 2018-03-05 MED ORDER — HEPARIN (PORCINE) IN NACL 1000-0.9 UT/500ML-% IV SOLN
INTRAVENOUS | Status: AC
  Filled 2018-03-05: qty 500

## 2018-03-05 MED ORDER — LIDOCAINE 2% (20 MG/ML) 5 ML SYRINGE
INTRAMUSCULAR | Status: DC | PRN
  Administered 2018-03-05: 100 mg via INTRAVENOUS

## 2018-03-05 MED ORDER — SODIUM CHLORIDE 0.9 % IV SOLN
INTRAVENOUS | Status: DC
  Administered 2018-03-05: 11:00:00 via INTRAVENOUS

## 2018-03-05 MED ORDER — SODIUM CHLORIDE 0.9 % IV SOLN
INTRAVENOUS | Status: DC
  Administered 2018-03-05 (×2): via INTRAVENOUS

## 2018-03-05 MED ORDER — DEXAMETHASONE SODIUM PHOSPHATE 10 MG/ML IJ SOLN
INTRAMUSCULAR | Status: DC | PRN
  Administered 2018-03-05: 10 mg via INTRAVENOUS

## 2018-03-05 MED ORDER — SODIUM CHLORIDE 0.9 % IV SOLN
INTRAVENOUS | Status: DC | PRN
  Administered 2018-03-05: 25 ug/min via INTRAVENOUS

## 2018-03-05 MED ORDER — HEPARIN (PORCINE) IN NACL 1000-0.9 UT/500ML-% IV SOLN
INTRAVENOUS | Status: DC | PRN
  Administered 2018-03-05: 500 mL

## 2018-03-05 MED ORDER — ONDANSETRON HCL 4 MG/2ML IJ SOLN
INTRAMUSCULAR | Status: DC | PRN
  Administered 2018-03-05: 4 mg via INTRAVENOUS

## 2018-03-05 MED ORDER — ACETAMINOPHEN 325 MG PO TABS
650.0000 mg | ORAL_TABLET | ORAL | Status: DC | PRN
  Administered 2018-03-05: 650 mg via ORAL
  Filled 2018-03-05 (×3): qty 2

## 2018-03-05 MED ORDER — BUPIVACAINE HCL (PF) 0.25 % IJ SOLN
INTRAMUSCULAR | Status: AC
  Filled 2018-03-05: qty 30

## 2018-03-05 MED ORDER — SODIUM CHLORIDE 0.9 % IV SOLN: 250.0000 mL | INTRAVENOUS | Status: DC | PRN

## 2018-03-05 MED ORDER — ONDANSETRON HCL 4 MG/2ML IJ SOLN: 4.0000 mg | Freq: Four times a day (QID) | INTRAMUSCULAR | Status: DC | PRN

## 2018-03-05 MED ORDER — BUPIVACAINE HCL (PF) 0.25 % IJ SOLN
INTRAMUSCULAR | Status: DC | PRN
  Administered 2018-03-05: 30 mL

## 2018-03-05 MED ORDER — PROPOFOL 10 MG/ML IV BOLUS
INTRAVENOUS | Status: DC | PRN
  Administered 2018-03-05: 200 mg via INTRAVENOUS

## 2018-03-05 MED ORDER — SODIUM CHLORIDE 0.9% FLUSH: 3.0000 mL | INTRAVENOUS | Status: DC | PRN

## 2018-03-05 SURGICAL SUPPLY — 12 items
BLANKET WARM UNDERBOD FULL ACC (MISCELLANEOUS) ×2 IMPLANT
CATH BLAZERPRIME XP LG CV 10MM (ABLATOR) ×2 IMPLANT
CATH DUODECA HALO/ISMUS 7FR (CATHETERS) ×2 IMPLANT
CATH OCTAPOLOR 6F 125CM 2-5-2 (CATHETERS) ×2 IMPLANT
CATH QUAD COURNAND 5FR REPROC (CATHETERS) ×2 IMPLANT
PACK EP LATEX FREE (CUSTOM PROCEDURE TRAY) ×3
PACK EP LF (CUSTOM PROCEDURE TRAY) ×1 IMPLANT
PAD DEFIB LIFELINK (PAD) ×2 IMPLANT
SHEATH ATRIAL FLUTTER SAFL 8F (SHEATH) ×2 IMPLANT
SHEATH PINNACLE 6F 10CM (SHEATH) ×2 IMPLANT
SHEATH PINNACLE 7F 10CM (SHEATH) ×2 IMPLANT
SHEATH PINNACLE 8F 10CM (SHEATH) ×4 IMPLANT

## 2018-03-05 NOTE — Discharge Instructions (Signed)
**Note -Identified via Obfuscation** No driving for 4 DAYS. No lifting over 5 lbs for 1 week. No vigorous or sexual activity for 1 week. You may return to work on 03/12/18. Keep procedure site clean & dry. If you notice increased pain, swelling, bleeding or pus, call/return!  You may shower, but no soaking baths/hot tubs/pools for 1 week.   Cardiac Ablation, Care After This sheet gives you information about how to care for yourself after your procedure. Your health care provider may also give you more specific instructions. If you have problems or questions, contact your health care provider. What can I expect after the procedure? After the procedure, it is common to have:  Bruising around your puncture site.  Tenderness around your puncture site.  Skipped heartbeats.  Tiredness (fatigue).  Follow these instructions at home: Puncture site care  Follow instructions from your health care provider about how to take care of your puncture site. Make sure you: ? Wash your hands with soap and water before you change your bandage (dressing). If soap and water are not available, use hand sanitizer. ? Change your dressing as told by your health care provider. ? Leave stitches (sutures), skin glue, or adhesive strips in place. These skin closures may need to stay in place for up to 2 weeks. If adhesive strip edges start to loosen and curl up, you may trim the loose edges. Do not remove adhesive strips completely unless your health care provider tells you to do that.  Check your puncture site every day for signs of infection. Check for: ? Redness, swelling, or pain. ? Fluid or blood. If your puncture site starts to bleed, lie down on your back, apply firm pressure to the area, and contact your health care provider. ? Warmth. ? Pus or a bad smell. Driving  Ask your health care provider when it is safe for you to drive again after the procedure.  Do not drive or use heavy machinery while taking prescription pain medicine.  Do not  drive for 24 hours if you were given a medicine to help you relax (sedative) during your procedure. Activity  Avoid activities that take a lot of effort for at least 3 days after your procedure.  Do not lift anything that is heavier than 10 lb (4.5 kg), or the limit that you are told, until your health care provider says that it is safe.  Return to your normal activities as told by your health care provider. Ask your health care provider what activities are safe for you. General instructions  Take over-the-counter and prescription medicines only as told by your health care provider.  Do not use any products that contain nicotine or tobacco, such as cigarettes and e-cigarettes. If you need help quitting, ask your health care provider.  Do not take baths, swim, or use a hot tub until your health care provider approves.  Do not drink alcohol for 24 hours after your procedure.  Keep all follow-up visits as told by your health care provider. This is important. Contact a health care provider if:  You have redness, mild swelling, or pain around your puncture site.  You have fluid or blood coming from your puncture site that stops after applying firm pressure to the area.  Your puncture site feels warm to the touch.  You have pus or a bad smell coming from your puncture site.  You have a fever.  You have chest pain or discomfort that spreads to your neck, jaw, or arm.  You are **Note -Identified via Obfuscation** sweating a lot.  You feel nauseous.  You have a fast or irregular heartbeat.  You have shortness of breath.  You are dizzy or light-headed and feel the need to lie down.  You have pain or numbness in the arm or leg closest to your puncture site. Get help right away if:  Your puncture site suddenly swells.  Your puncture site is bleeding and the bleeding does not stop after applying firm pressure to the area. These symptoms may represent a serious problem that is an emergency. Do not wait to see if the  symptoms will go away. Get medical help right away. Call your local emergency services (911 in the [place] Do not drive yourself to the hospital. Summary  After the procedure, it is normal to have bruising and tenderness at the puncture site in your groin, neck, or forearm.  Check your puncture site every day for signs of infection.  Get help right away if your puncture site is bleeding and the bleeding does not stop after applying firm pressure to the area. This is a medical emergency. This information is not intended to replace advice given to you by your health care provider. Make sure you discuss any questions you have with your health care provider. Document Released: 10/16/2016 Document Revised: 10/16/2016 Document Reviewed: 10/16/2016 Elsevier Interactive Patient Education  2018 [employer].

## 2018-03-05 NOTE — Anesthesia Postprocedure Evaluation (Signed)
Anesthesia Post Note  Patient: Michael Choi  Procedure(s) Performed: A-FLUTTER ABLATION (N/A )     Patient location during evaluation: PACU Anesthesia Type: General Level of consciousness: awake and alert Pain management: pain level controlled Vital Signs Assessment: post-procedure vital signs reviewed and stable Respiratory status: spontaneous breathing, nonlabored ventilation, respiratory function stable and patient connected to nasal cannula oxygen Cardiovascular status: blood pressure returned to baseline and stable Postop Assessment: no apparent nausea or vomiting Anesthetic complications: no    Last Vitals:  Vitals:   03/05/18 1218 03/05/18 1219  BP:    Pulse: 90 96  Resp: 13 15  Temp:    SpO2: 96% 96%    Last Pain:  Vitals:   03/05/18 1041  TempSrc: Temporal  PainSc: 1                  Ryan P Ellender

## 2018-03-05 NOTE — Anesthesia Preprocedure Evaluation (Addendum)
Anesthesia Evaluation  Patient identified by MRN, date of birth, ID band Patient awake    Reviewed: Allergy & Precautions, NPO status , Patient's Chart, lab work & pertinent test results  Airway Mallampati: IV  TM Distance: >3 FB Neck ROM: Full    Dental no notable dental hx.    Pulmonary sleep apnea ,    Pulmonary exam normal breath sounds clear to auscultation       Cardiovascular hypertension, Pt. on medications +CHF  Normal cardiovascular exam Rhythm:Regular Rate:Normal  ECG: ST, rate 151, occ PVC's  ECHO:  Left ventricle: The estimated ejection fraction was in the range of 30% to 35%. Recommend follow up transthoracic echocardiogram in sinus rhythm for accurate LVEF evaluation. No evidence of thrombus. Left atrium: No evidence of thrombus in the atrial cavity or appendage. No evidence of thrombus in the atrial cavity or appendage.    Neuro/Psych PSYCHIATRIC DISORDERS negative neurological ROS     GI/Hepatic negative GI ROS, Neg liver ROS,   Endo/Other  negative endocrine ROS  Renal/GU negative Renal ROS     Musculoskeletal negative musculoskeletal ROS (+)   Abdominal (+) + obese,   Peds  Hematology Testosterone induced elevated H and H   Anesthesia Other Findings A-flutter  Reproductive/Obstetrics                            Anesthesia Physical Anesthesia Plan  ASA: III  Anesthesia Plan: General   Post-op Pain Management:    Induction: Intravenous  PONV Risk Score and Plan: 2 and Ondansetron, Dexamethasone, Midazolam and Treatment may vary due to age or medical condition  Airway Management Planned: Oral ETT and Video Laryngoscope Planned  Additional Equipment:   Intra-op Plan:   Post-operative Plan: Extubation in OR  Informed Consent: I have reviewed the patients History and Physical, chart, labs and discussed the procedure including the risks, benefits and  alternatives for the proposed anesthesia with the patient or authorized representative who has indicated his/her understanding and acceptance.   Dental advisory given  Plan Discussed with: CRNA  Anesthesia Plan Comments:        Anesthesia Quick Evaluation

## 2018-03-05 NOTE — Interval H&P Note (Signed)
History and Physical Interval Note:  03/05/2018 7:34 AM  Michael Choi  has presented today for surgery, with the diagnosis of aflutter  The various methods of treatment have been discussed with the patient and family. After consideration of risks, benefits and other options for treatment, the patient has consented to  Procedure(s): A-FLUTTER ABLATION (N/A) as a surgical intervention .  The patient's history has been reviewed, patient examined, no change in status, stable for surgery.  I have reviewed the patient's chart and labs.  Questions were answered to the patient's satisfaction.   Spoke with anesthesia re secondary erythrocytosis;  Pt on anticoagulation   Virl Axe

## 2018-03-05 NOTE — Transfer of Care (Signed)
Immediate Anesthesia Transfer of Care Note  Patient: Michael Choi  Procedure(s) Performed: A-FLUTTER ABLATION (N/A )  Patient Location: PACU and Cath Lab  Anesthesia Type:General  Level of Consciousness: awake, patient cooperative and responds to stimulation  Airway & Oxygen Therapy: Patient Spontanous Breathing and Patient connected to nasal cannula oxygen  Post-op Assessment: Report given to RN, Post -op Vital signs reviewed and stable and Patient moving all extremities X 4  Post vital signs: Reviewed and stable  Last Vitals:  Vitals Value Taken Time  BP 127/95 03/05/2018 10:04 AM  Temp    Pulse 92 03/05/2018 10:06 AM  Resp 12 03/05/2018 10:06 AM  SpO2 94 % 03/05/2018 10:06 AM  Vitals shown include unvalidated device data.  Last Pain:  Vitals:   03/05/18 0549  TempSrc:   PainSc: 0-No pain      Patients Stated Pain Goal: 2 (76/54/65 0354)  Complications: No apparent anesthesia complications

## 2018-03-05 NOTE — Progress Notes (Addendum)
Site area: RFV x 2 /LFV x 2 Site Prior to Removal:  Level 0/0 Pressure Applied For: 20 min each side Manual:    Patient Status During Pull:  stable Post Pull Site:  Level 0/0 Post Pull Instructions Given: yes  Post Pull Pulses Present: palpable Dressing Applied:  clear Bedrest begins @ 4859 Comments: right by canderson----left by Eddie Dibbles

## 2018-03-08 ENCOUNTER — Encounter (HOSPITAL_COMMUNITY): Payer: Self-pay | Admitting: Internal Medicine

## 2018-03-08 ENCOUNTER — Ambulatory Visit: Payer: 59 | Admitting: Internal Medicine

## 2018-03-08 ENCOUNTER — Telehealth: Payer: Self-pay | Admitting: Internal Medicine

## 2018-03-08 NOTE — Telephone Encounter (Signed)
Called pt today and discussed bruising on his groin site. Pt was ablated on 8/16 while on eliquis. I advised pt as long as his tissue was soft, blood pressure was normal, and he did not see any swelling, the bruising at his sites was normal. Pt denied any of the above symptoms, only stated he had bruising. We also discussed wound care. I advised pt to remove his dressings and wash his groin lightly with soap and water, pat dry. He may apply clean band-aides to the site. If his groin began to bleed, he should hold pressure below the site until it stops. If groin continued to bleed, he needs to seek medical attention. Pt has verbalized understanding.  Pt also states that he had a transient episode where his face felt "knumb, like it was asleep." Pt denies any facial drooping, slurred speech, or weakness. Pt states he was believes he was laying on his shoulder or neck in "the wrong way." I have advised pt to go to the ED if this happens again. Pt is current on his anticoagulant.   Pt has verbalized understanding.

## 2018-03-08 NOTE — Telephone Encounter (Signed)
Pt calling c/o bruising at his cath site, wants to discuss with nurse to make sure it's normal, pls advise [phone]

## 2018-03-09 ENCOUNTER — Encounter: Payer: Self-pay | Admitting: Internal Medicine

## 2018-03-10 NOTE — Telephone Encounter (Signed)
Called to check up on pt today. He states his bruising has spread in the last few days. He states it is below the puncture site which is concerning to Dr [name]. Pt also states he woke up with some tingling in his face this morning, but it went away quickly. I have placed pt on the schedule for tomorrow for further assessment by Dr [name]. Pt understands if he has continued facial numbness and tingling, pain at the groin, or perfuse bleeding at his puncture sites, he should seek help at the ED. Do not wait for his OV on 8/21.   Pt has verbalized understanding and has no additional questions.

## 2018-03-11 ENCOUNTER — Ambulatory Visit: Payer: 59 | Admitting: Internal Medicine

## 2018-03-11 ENCOUNTER — Encounter: Payer: Self-pay | Admitting: Internal Medicine

## 2018-03-11 VITALS — BP 120/78 | HR 56 | Ht 71.0 in | Wt 235.8 lb

## 2018-03-11 DIAGNOSIS — I4892 Unspecified atrial flutter: Secondary | ICD-10-CM | POA: Diagnosis not present

## 2018-03-11 NOTE — Progress Notes (Signed)
Came in to be seen about his groin   Bilateral ecchymosis without hematoma.  Okay to return to work tomorrow.  Feeling much better.

## 2018-03-29 ENCOUNTER — Other Ambulatory Visit: Payer: Self-pay | Admitting: Internal Medicine

## 2018-03-29 NOTE — Telephone Encounter (Signed)
Eliquis 5mg  refill request received; pt is 57 yrs old, wt-107kg, Crea-1.29 on 03/02/18, last seen by Dr. [name] on 03/11/18; will send in refill to requested pharmacy.

## 2018-03-31 ENCOUNTER — Ambulatory Visit: Payer: 59 | Admitting: Internal Medicine

## 2018-04-23 ENCOUNTER — Telehealth: Payer: Self-pay | Admitting: Internal Medicine

## 2018-04-23 DIAGNOSIS — G3184 Mild cognitive impairment, so stated: Secondary | ICD-10-CM | POA: Diagnosis not present

## 2018-04-23 DIAGNOSIS — R110 Nausea: Secondary | ICD-10-CM | POA: Diagnosis not present

## 2018-04-23 DIAGNOSIS — R413 Other amnesia: Secondary | ICD-10-CM | POA: Diagnosis not present

## 2018-04-23 NOTE — Telephone Encounter (Signed)
The patient called with symptoms of dizziness, nausea, shakiness, headaches and some memory loss.  He will contact his PCP or go to urgent care and be evaluated for the flu.  I told him that we would f/u on 10/7, if further recommendations.

## 2018-04-23 NOTE — Telephone Encounter (Signed)
New message:      Pt c/o medication issue:  1. Name of Medication: ELIQUIS 5 MG TABS tablet  2. How are you currently taking this medication (dosage and times per day)? TAKE 1 TABLET BY MOUTH TWICE A DAY  3. Are you having a reaction (difficulty breathing--STAT)? No  4. What is your medication issue? Pt states he is feeling nausea and dizziness and he wants to know if these are short term side effects of this medication. Pt states he has been experiencing this since Monday

## 2018-05-17 ENCOUNTER — Ambulatory Visit: Payer: 59 | Admitting: Internal Medicine

## 2018-05-18 NOTE — Progress Notes (Signed)
Patient Care Team: [name], MD as PCP - General (Family Medicine)   HPI  Michael Choi is a 57 y.o. male Seen in followup for atrial flutter for which he underwent ablation 8/19  OSA, previously untreated.   Complains of dyspnea on exertion.  Better since his ablation but still quite limiting.  Also has some exertional and nonexertional chest pressure.  Nocturnal dyspnea and orthopnea.  DATE TEST EF   5/19 Echo   45-50 %   5/19 TEE  30-35 %         Thromboembolic risk factors (, HTN-1, CHF-1) for a CHADSVASc Score of 2  Date Cr K Hgb  8/19 1.29 5.0 17.9           Records and Results Reviewed   Past Medical History:  Diagnosis Date  . Atrial flutter ([place])   . Hypertension   . Melanoma [facility])     Past Surgical History:  Procedure Laterality Date  . A-FLUTTER ABLATION N/A 03/05/2018   Procedure: A-FLUTTER ABLATION;  Surgeon: [name], MD;  Location: [place] [facility];  Service: Cardiovascular;  Laterality: N/A;  . CARDIOVERSION N/A 11/27/2017   Procedure: CARDIOVERSION;  Surgeon: [name], MD;  Location: [facility] ENDOSCOPY;  Service: Cardiovascular;  Laterality: N/A;  . KNEE ARTHROSCOPY    . TEE WITHOUT CARDIOVERSION N/A 11/27/2017   Procedure: TRANSESOPHAGEAL ECHOCARDIOGRAM (TEE);  Surgeon: [name], MD;  Location: [facility] ENDOSCOPY;  Service: Cardiovascular;  Laterality: N/A;    Current Meds  Medication Sig  . amLODipine (NORVASC) 5 MG tablet Take 5 mg by mouth daily.  Marland Kitchen ELIQUIS 5 MG TABS tablet TAKE 1 TABLET BY MOUTH TWICE A DAY  . losartan (COZAAR) 50 MG tablet Take 50 mg by mouth daily.  . multivitamin (THERAGRAN) per tablet Take 1 tablet by mouth daily.  Marland Kitchen testosterone cypionate (DEPOTESTOSTERONE CYPIONATE) 200 MG/ML injection Inject 1 mL into the muscle every 14 (fourteen) days.     Allergies  Allergen Reactions  . Cleocin [Clindamycin Hcl]   . Doxycycline Swelling  . Tetracycline Rash      Review of Systems negative  except from HPI and PMH  Physical Exam BP 136/88   Pulse (!) 58   Ht 5\' 11"  (1.803 m)   Wt 245 lb 9.6 oz (111.4 kg)   SpO2 95%   BMI 34.25 kg/m  *Well developed and nourished in no acute distress HENT normal Neck supple with JVP-7-8 Clear Regular rate and rhythm, no murmurs or gallops Abd-soft with active BS No Clubbing cyanosis edema Skin-warm and dry A & Oriented  Grossly normal sensory and motor function    ECG sinus at 58 with frequent PACs Intervals 15/09/40    Assessment and  Plan Atrial Flutter  Nonischemic cardiomyopathy  Hypertension  Polycythemia  Dyspnea on exertion/congestive heart failure class IIb  Chest discomfort-exertional    The patient has exertional dyspnea with some chest discomfort.  His cardiomyopathy was presumed, perhaps hopefully, to be rate related.  We will repeat his ultrasound.  In addition, his chest discomfort is concerning.  Check his lipids and CTA.  Some of his symptoms may be related to polycythemia.  He is to see his PCP just a couple of weeks.  I will defer the evaluation of that to them.  Alternatively he could go to hematology.  With memory issues, he is going to neurology.  I will wait until our evaluation is complete prior to making decision regarding  anticoagulation to exclude a possible stroke which would prompt continuing apixaban.  Has had some problems with dizziness that responded to holding his amlodipine.  We will keep track of his blood pressure.  In the event that he has a cardiomyopathy, will be appropriate to change medications.  Options may be somewhat limited by his last potassium 5.0; we will repeat that today as it was anomalously high  Mildly volume overloaded.  We will give him a low dose diuretic exposure.  His diet is salt replete-overly.  Encouraged him to decrease sodium intake  Has known sleep apnea from a few years ago.  He is changing PCPs.  We will take the liberty of ordering a sleep study  More  than 50% of 40 min was spent in counseling related to the above

## 2018-05-19 ENCOUNTER — Ambulatory Visit: Payer: 59 | Admitting: Internal Medicine

## 2018-05-19 ENCOUNTER — Encounter: Payer: Self-pay | Admitting: Internal Medicine

## 2018-05-19 ENCOUNTER — Telehealth: Payer: Self-pay | Admitting: *Deleted

## 2018-05-19 ENCOUNTER — Encounter: Payer: Self-pay | Admitting: *Deleted

## 2018-05-19 ENCOUNTER — Telehealth: Payer: Self-pay | Admitting: Internal Medicine

## 2018-05-19 VITALS — BP 136/88 | HR 58 | Ht 71.0 in | Wt 245.6 lb

## 2018-05-19 DIAGNOSIS — I429 Cardiomyopathy, unspecified: Secondary | ICD-10-CM | POA: Diagnosis not present

## 2018-05-19 DIAGNOSIS — Z79899 Other long term (current) drug therapy: Secondary | ICD-10-CM

## 2018-05-19 DIAGNOSIS — R079 Chest pain, unspecified: Secondary | ICD-10-CM | POA: Diagnosis not present

## 2018-05-19 DIAGNOSIS — I4892 Unspecified atrial flutter: Secondary | ICD-10-CM

## 2018-05-19 DIAGNOSIS — G4730 Sleep apnea, unspecified: Secondary | ICD-10-CM | POA: Diagnosis not present

## 2018-05-19 LAB — LIPID PANEL
CHOL/HDL RATIO: 3.6 ratio (ref 0.0–5.0)
Cholesterol, Total: 158 mg/dL (ref 100–199)
HDL: 44 mg/dL (ref >39)
LDL Calculated: 100 mg/dL — ABNORMAL HIGH (ref 0–99)
TRIGLYCERIDES: 71 mg/dL (ref 0–149)
VLDL CHOLESTEROL CAL: 14 mg/dL (ref 5–40)

## 2018-05-19 LAB — CBC
HEMATOCRIT: 53 % — AB (ref 37.5–51.0)
HEMOGLOBIN: 18.1 g/dL — AB (ref 13.0–17.7)
MCH: 31.3 pg (ref 26.6–33.0)
MCHC: 34.2 g/dL (ref 31.5–35.7)
MCV: 92 fL (ref 79–97)
Platelets: 291 10*3/uL (ref 150–450)
RBC: 5.78 x10E6/uL (ref 4.14–5.80)
RDW: 13.7 % (ref 12.3–15.4)
WBC: 7.7 10*3/uL (ref 3.4–10.8)

## 2018-05-19 MED ORDER — FUROSEMIDE 20 MG PO TABS: 20.0000 mg | ORAL_TABLET | Freq: Every day | ORAL | 0 refills | Status: DC

## 2018-05-19 MED ORDER — METOPROLOL TARTRATE 100 MG PO TABS: ORAL_TABLET | ORAL | 0 refills | Status: DC

## 2018-05-19 NOTE — Telephone Encounter (Signed)
New Message    Pt c/o medication issue:  1. Name of Medication: Lasix  2. How are you currently taking this medication (dosage and times per day)? Take 1 tablet (20 mg total) by mouth daily. For 3 days  3. Are you having a reaction (difficulty breathing--STAT)? no  4. What is your medication issue? Pt calling to check if the medication was sent to his pharmacy. Please call

## 2018-05-19 NOTE — Telephone Encounter (Signed)
PA submitted to [employer] for sleep study via web portal. Clinicals faxed to [phone]. 

## 2018-05-19 NOTE — Telephone Encounter (Signed)
Endorsed to the pt that his lasix was indeed called into his pharmacy and is available for pick-up.  Pt verbalized understanding and gracious for all the assistance.

## 2018-05-19 NOTE — Patient Instructions (Addendum)
Medication Instructions:  Your physician has recommended you make the following change in your medication:  1. TAKE Lasix 20 mg once daily for 3 days, then stop  If you need a refill on your cardiac medications before your next appointment, please call your pharmacy.   Lab work: Today: CBC & Lipid profile  Testing/Procedures: Your physician has requested that you have an echocardiogram. Echocardiography is a painless test that uses sound waves to create images of your heart. It provides your doctor with information about the size and shape of your heart and how well your heart's chambers and valves are working. This procedure takes approximately one hour. There are no restrictions for this procedure.  Your physician has requested that you have cardiac CT. Cardiac computed tomography (CT) is a painless test that uses an x-ray machine to take clear, detailed pictures of your heart. For further information please visit [email or web address]. Please follow instruction sheet as given.  Your physician has recommended that you have a sleep study. This test records several body functions during sleep, including: brain activity, eye movement, oxygen and carbon dioxide blood levels, heart rate and rhythm, breathing rate and rhythm, the flow of air through your mouth and nose, snoring, body muscle movements, and chest and belly movement.  Follow-Up: At [facility], you and your health needs are our priority.  As part of our continuing mission to provide you with exceptional heart care, we have created designated Provider Care Teams.  These Care Teams include your primary Cardiologist (physician) and Advanced Practice Providers (APPs -  Physician Assistants and Nurse Practitioners) who all work together to provide you with the care you need, when you need it. You will need a follow up appointment in 2 months.   You will see  one of the following Advanced Practice Providers on your designated Care Team:     Chanetta Marshall, NP . Tommye Standard, PA-C  Thank you for choosing [facility]!!

## 2018-05-19 NOTE — Telephone Encounter (Signed)
-----   Message from Stanton Kidney, RN sent at 05/19/2018  9:39 AM EDT ----- Regarding: Sleep study Please precert and schedule sleep study. Pt has sleep apnea  thx

## 2018-05-24 ENCOUNTER — Telehealth: Payer: Self-pay | Admitting: *Deleted

## 2018-05-24 NOTE — Telephone Encounter (Signed)
-----   Message from Stanton Kidney, RN sent at 05/19/2018  9:39 AM EDT ----- Regarding: Sleep study Please precert and schedule sleep study. Pt has sleep apnea  thx

## 2018-05-24 NOTE — Telephone Encounter (Signed)
Staff message sent to [place] received. Ok to schedule sleep study. Auth # [id number]. Valid 05/19/18 to 07/20/18.

## 2018-05-25 ENCOUNTER — Telehealth: Payer: Self-pay | Admitting: Internal Medicine

## 2018-05-25 NOTE — Telephone Encounter (Signed)
Pt states he has been having intermittent chest tightness, especially at night.  Denies other symptoms.  Pt states tightness is on the left side, right above the Xiphoid.  No vitals available.  Only feels it when he is taking deep breaths.  Denies fever or other symptoms to indicate a cold, bronchitis, or pneumonia.  States not excruciating.  Spoke with Dr. Marlou Porch, DOD.  He advised pt to keep appt for CT and go to ER if any changes in symptoms.  Pt verbalized understanding and was in agreement with this plan.

## 2018-05-25 NOTE — Telephone Encounter (Signed)
New Message         Patient had an abrasion and he is now having tightness in his chest.

## 2018-05-26 ENCOUNTER — Telehealth: Payer: Self-pay | Admitting: *Deleted

## 2018-05-26 NOTE — Telephone Encounter (Signed)
-----   Message from Lauralee Evener, [place] sent at 05/24/2018 10:08 AM EST ----- Regarding: RE: Sleep study Received approval from [facility]. Ok to schedule sleep study.  Auth # [id number] valid 05/19/18 to 07/20/18. ----- Message ----- From: Stanton Kidney, RN Sent: 05/19/2018   9:39 AM EST To: Dollene Primrose, RN, [facility] Sleep Studies Subject: Sleep study                                    Please precert and schedule sleep study. Pt has sleep apnea  thx

## 2018-05-26 NOTE — Telephone Encounter (Signed)
Patient is scheduled for lab study on 07/09/18. Patient understands his sleep study will be done at [facility] sleep lab. Patient understands he will receive a sleep packet in a week or so. Patient understands to call if he does not receive the sleep packet in a timely manner.  Left detailed message on voicemail with date and time of sleep study and informed patient to call back to confirm or reschedule.

## 2018-05-28 NOTE — Telephone Encounter (Signed)
Return call: Pt is aware and agreeable to this treatment.

## 2018-06-02 ENCOUNTER — Telehealth: Payer: Self-pay

## 2018-06-02 ENCOUNTER — Telehealth: Payer: Self-pay | Admitting: Internal Medicine

## 2018-06-02 NOTE — Telephone Encounter (Signed)
Pt came by the office today. He wanted to discuss his lab results and lasix. Pt understands and is aware of high Hgb. This has been evaluated last year with oncology. Pt also states he is having some increased SOB and asked if he might have a refill on his lasix. I advised him I would refill for the next few days, but if he continues to need this support, he will need to discuss with Dr [name] and begin a K+ supplement. Pt agrees.   Pt also states he has intermittent chest pain. I advised him to seek care from an ED if it is sustained. Otherwise he will be fully evaluated when he has his CT performed next week.   Pt has agreed with and verbalized all of the above. He had no additional questions.

## 2018-06-02 NOTE — Telephone Encounter (Signed)
-----   Message from [name], MD sent at 06/02/2018 11:01 AM EST ----- Please Inform Patient  Labs are normal x elevated hemoglobin   Has he seen his PCP about this or hematology?    Thanks

## 2018-06-02 NOTE — Telephone Encounter (Signed)
New Message:      Please call,he wants to talk to you about the fluid pill that Dr [name] put him on(can not remember the name of it).

## 2018-06-02 NOTE — Telephone Encounter (Signed)
LVM for return call. 

## 2018-06-03 ENCOUNTER — Telehealth: Payer: Self-pay | Admitting: Internal Medicine

## 2018-06-03 MED ORDER — FUROSEMIDE 20 MG PO TABS: 20.0000 mg | ORAL_TABLET | Freq: Every day | ORAL | 0 refills | Status: DC

## 2018-06-03 NOTE — Telephone Encounter (Signed)
New Message          Patient called today inquiring about his Rx, he states it has not been called in. Pls call and advise.

## 2018-06-03 NOTE — Telephone Encounter (Signed)
Script sent in. Will discuss with Dr [name] regarding continuation.

## 2018-06-07 ENCOUNTER — Ambulatory Visit (HOSPITAL_COMMUNITY): Admission: RE | Admit: 2018-06-07 | Payer: 59 | Source: Ambulatory Visit

## 2018-06-07 ENCOUNTER — Ambulatory Visit (HOSPITAL_COMMUNITY)
Admission: RE | Admit: 2018-06-07 | Discharge: 2018-06-07 | Disposition: A | Discharge status: Home / Self Care | Payer: 59 | Source: Ambulatory Visit | Attending: Internal Medicine | Admitting: Internal Medicine

## 2018-06-07 DIAGNOSIS — I700 Atherosclerosis of aorta: Secondary | ICD-10-CM | POA: Insufficient documentation

## 2018-06-07 DIAGNOSIS — R079 Chest pain, unspecified: Secondary | ICD-10-CM

## 2018-06-07 MED ORDER — IOPAMIDOL (ISOVUE-370) INJECTION 76%
100.0000 mL | Freq: Once | INTRAVENOUS | Status: AC | PRN
  Administered 2018-06-07: 100 mL via INTRAVENOUS

## 2018-06-07 MED ORDER — NITROGLYCERIN 0.4 MG SL SUBL
0.8000 mg | SUBLINGUAL_TABLET | Freq: Once | SUBLINGUAL | Status: AC
  Administered 2018-06-07: 0.8 mg via SUBLINGUAL
  Filled 2018-06-07: qty 25

## 2018-06-07 MED ORDER — NITROGLYCERIN 0.4 MG SL SUBL
SUBLINGUAL_TABLET | SUBLINGUAL | Status: AC
  Filled 2018-06-07: qty 2

## 2018-06-16 ENCOUNTER — Telehealth: Payer: Self-pay | Admitting: *Deleted

## 2018-06-16 ENCOUNTER — Ambulatory Visit (HOSPITAL_COMMUNITY): Payer: 59 | Attending: Internal Medicine

## 2018-06-16 ENCOUNTER — Other Ambulatory Visit: Payer: Self-pay

## 2018-06-16 DIAGNOSIS — I429 Cardiomyopathy, unspecified: Secondary | ICD-10-CM | POA: Diagnosis not present

## 2018-06-16 NOTE — Telephone Encounter (Signed)
-----   Message from Michael Sprang, MD sent at 06/16/2018  9:00 AM EST ----- Please Inform Patient that study was normal This is great news about no CAD   Thanks

## 2018-06-16 NOTE — Telephone Encounter (Signed)
Working [employer]. Called pt re: CT results, left a message for him to call back.

## 2018-06-16 NOTE — Telephone Encounter (Signed)
Pt has been made aware of his normal CT Results. He was very appreciative of the good news.

## 2018-06-16 NOTE — Telephone Encounter (Signed)
-----   Message from [name], MD sent at 06/16/2018  9:00 AM EST ----- Please Inform Patient that study was normal This is great news about no CAD   Thanks

## 2018-06-16 NOTE — Telephone Encounter (Signed)
Returned pts call. See result note for CT Test.

## 2018-06-16 NOTE — Telephone Encounter (Signed)
New message    Patient came in for his echo , he is wanting to know the results for his CT test

## 2018-06-28 ENCOUNTER — Ambulatory Visit: Payer: 59 | Admitting: Diagnostic Neuroimaging

## 2018-07-01 DIAGNOSIS — D751 Secondary polycythemia: Secondary | ICD-10-CM | POA: Diagnosis not present

## 2018-07-01 DIAGNOSIS — Z1211 Encounter for screening for malignant neoplasm of colon: Secondary | ICD-10-CM | POA: Diagnosis not present

## 2018-07-01 DIAGNOSIS — I10 Essential (primary) hypertension: Secondary | ICD-10-CM | POA: Diagnosis not present

## 2018-07-08 ENCOUNTER — Encounter (HOSPITAL_BASED_OUTPATIENT_CLINIC_OR_DEPARTMENT_OTHER): Payer: Self-pay | Admitting: *Deleted

## 2018-07-08 ENCOUNTER — Other Ambulatory Visit: Payer: Self-pay

## 2018-07-08 ENCOUNTER — Emergency Department (HOSPITAL_BASED_OUTPATIENT_CLINIC_OR_DEPARTMENT_OTHER)
Admission: EM | Admit: 2018-07-08 | Discharge: 2018-07-08 | Disposition: A | Discharge status: Home / Self Care | Payer: No Typology Code available for payment source | Attending: Emergency Medicine | Admitting: Emergency Medicine

## 2018-07-08 DIAGNOSIS — Y9389 Activity, other specified: Secondary | ICD-10-CM | POA: Diagnosis not present

## 2018-07-08 DIAGNOSIS — I10 Essential (primary) hypertension: Secondary | ICD-10-CM | POA: Insufficient documentation

## 2018-07-08 DIAGNOSIS — S61032A Puncture wound without foreign body of left thumb without damage to nail, initial encounter: Secondary | ICD-10-CM | POA: Insufficient documentation

## 2018-07-08 DIAGNOSIS — Z7721 Contact with and (suspected) exposure to potentially hazardous body fluids: Secondary | ICD-10-CM | POA: Diagnosis not present

## 2018-07-08 DIAGNOSIS — Y929 Unspecified place or not applicable: Secondary | ICD-10-CM | POA: Insufficient documentation

## 2018-07-08 DIAGNOSIS — Y998 Other external cause status: Secondary | ICD-10-CM | POA: Diagnosis not present

## 2018-07-08 DIAGNOSIS — W268XXA Contact with other sharp object(s), not elsewhere classified, initial encounter: Secondary | ICD-10-CM

## 2018-07-08 DIAGNOSIS — W260XXA Contact with knife, initial encounter: Secondary | ICD-10-CM | POA: Diagnosis not present

## 2018-07-08 DIAGNOSIS — Z578 Occupational exposure to other risk factors: Secondary | ICD-10-CM | POA: Diagnosis not present

## 2018-07-08 LAB — COMPREHENSIVE METABOLIC PANEL
ALBUMIN: 3.8 g/dL (ref 3.5–5.0)
ALK PHOS: 39 U/L (ref 38–126)
ALT: 31 U/L (ref 0–44)
AST: 28 U/L (ref 15–41)
Anion gap: 8 (ref 5–15)
BILIRUBIN TOTAL: 0.8 mg/dL (ref 0.3–1.2)
BUN: 13 mg/dL (ref 6–20)
CALCIUM: 8.9 mg/dL (ref 8.9–10.3)
CO2: 24 mmol/L (ref 22–32)
Chloride: 104 mmol/L (ref 98–111)
Creatinine, Ser: 1.34 mg/dL — ABNORMAL HIGH (ref 0.61–1.24)
GFR calc Af Amer: >60 mL/min (ref >60)
GFR calc non Af Amer: 58 mL/min — ABNORMAL LOW (ref >60)
GLUCOSE: 134 mg/dL — AB (ref 70–99)
Potassium: 3.5 mmol/L (ref 3.5–5.1)
SODIUM: 136 mmol/L (ref 135–145)
TOTAL PROTEIN: 7.1 g/dL (ref 6.5–8.1)

## 2018-07-08 LAB — RAPID HIV SCREEN (HIV 1/2 AB+AG)
HIV 1/2 Antibodies: NONREACTIVE
HIV-1 P24 Antigen - HIV24: NONREACTIVE

## 2018-07-08 MED ORDER — TETANUS-DIPHTH-ACELL PERTUSSIS 5-2.5-18.5 LF-MCG/0.5 IM SUSP
0.5000 mL | Freq: Once | INTRAMUSCULAR | Status: AC
  Administered 2018-07-08: 0.5 mL via INTRAMUSCULAR

## 2018-07-08 MED ORDER — ELVITEG-COBIC-EMTRICIT-TENOFAF 150-150-200-10 MG PO TABS
ORAL_TABLET | ORAL | Status: AC
  Filled 2018-07-08: qty 4

## 2018-07-08 MED ORDER — TETANUS-DIPHTH-ACELL PERTUSSIS 5-2.5-18.5 LF-MCG/0.5 IM SUSP
INTRAMUSCULAR | Status: AC
  Administered 2018-07-08: 0.5 mL via INTRAMUSCULAR
  Filled 2018-07-08: qty 0.5

## 2018-07-08 MED ORDER — ELVITEG-COBIC-EMTRICIT-TENOFAF 150-150-200-10 MG PREPACK
5.0000 | ORAL_TABLET | Freq: Once | ORAL | Status: AC
  Administered 2018-07-08: 5 via ORAL
  Filled 2018-07-08: qty 5

## 2018-07-08 MED ORDER — ELVITEG-COBIC-EMTRICIT-TENOFAF 150-150-200-10 MG PO TABS
ORAL_TABLET | ORAL | Status: AC
  Administered 2018-07-08: 1
  Filled 2018-07-08: qty 1

## 2018-07-08 MED ORDER — ELVITEG-COBIC-EMTRICIT-TENOFAF 150-150-200-10 MG PO TABS: 1.0000 | ORAL_TABLET | Freq: Every day | ORAL | 0 refills | Status: DC

## 2018-07-08 NOTE — ED Triage Notes (Signed)
Puncture wound by sharp object on pt he was searching   Left thumb  Bleeding controlled at present

## 2018-07-08 NOTE — ED Notes (Signed)
genvoya   1 table given in ed  4 given to take at home  1 tablet q day x 4 days

## 2018-07-08 NOTE — Discharge Instructions (Signed)
Due to the high risk exposure with the razor blade stick on the suspect who uses heroin, the recommendations per protocol were to start you on HIV prophylaxis.  We also tested for other hepatitis infections or sexually transmitted infection.  Please follow-up with your primary doctor for these results.  Please continue your antiviral HIV medication for the next 30 days as we discussed.  When you speak with your PCP and Worker's Comp. team, they will help plan repeat testing and follow-up.  If any symptoms change or worsen, please return to the nearest emergency department.

## 2018-07-08 NOTE — ED Notes (Signed)
Thumb cleaned per md orders w soap and water, and alcohol

## 2018-07-08 NOTE — ED Provider Notes (Signed)
[facility] EMERGENCY DEPARTMENT Provider Note   CSN: [id number] Arrival date & time: 07/08/18  0102     History   Chief Complaint Chief Complaint  Patient presents with  . Puncture Wound    HPI Michael Choi is a 57 y.o. male.  The history is provided by the patient and medical records. No language interpreter was used.  Body Fluid Exposure  Exposure mechanism: razor blade stick. Exposure substance: unknown   Exposure location:  Finger Finger exposure location:  L thumb Time since exposure:  3 hours Context:  Occupational (non-healthcare) Tetanus immunization status:  Unknown Patient immunocompromised: no   STD concern: yes (heroin user)     Past Medical History:  Diagnosis Date  . Atrial flutter ([place])   . Hypertension   . Melanoma [facility])     Patient Active Problem List   Diagnosis Date Noted  . Atrial flutter ([place]) 11/26/2017  . Hypogonadism male 09/27/2017  . Secondary erythrocytosis 08/28/2017  . Prediabetes 04/03/2015  . HTN (hypertension) 10/13/2011  . Attention deficit hyperactivity disorder (ADHD) 04/02/2009    Past Surgical History:  Procedure Laterality Date  . A-FLUTTER ABLATION N/A 03/05/2018   Procedure: A-FLUTTER ABLATION;  Surgeon: [name], MD;  Location: [place] [facility] LAB;  Service: Cardiovascular;  Laterality: N/A;  . CARDIOVERSION N/A 11/27/2017   Procedure: CARDIOVERSION;  Surgeon: [name], MD;  Location: [facility] ENDOSCOPY;  Service: Cardiovascular;  Laterality: N/A;  . KNEE ARTHROSCOPY    . TEE WITHOUT CARDIOVERSION N/A 11/27/2017   Procedure: TRANSESOPHAGEAL ECHOCARDIOGRAM (TEE);  Surgeon: [name], MD;  Location: [facility] ENDOSCOPY;  Service: Cardiovascular;  Laterality: N/A;        Home Medications    Prior to Admission medications   Medication Sig Start Date End Date Taking? Authorizing Provider  amLODipine (NORVASC) 5 MG tablet Take 5 mg by mouth daily. 12/31/16   [provider]  ELIQUIS  5 MG TABS tablet TAKE 1 TABLET BY MOUTH TWICE A DAY 03/29/18   [name], MD  furosemide (LASIX) 20 MG tablet Take 1 tablet (20 mg total) by mouth daily. For 3 days 06/03/18 09/01/18  [name], MD  losartan (COZAAR) 50 MG tablet Take 50 mg by mouth daily. 01/20/17   [provider]  metoprolol tartrate (LOPRESSOR) 100 MG tablet Take 1 tablet (100 mg total) two hours prior to cardiac CT test 05/19/18   [name], MD  multivitamin [facility]) per tablet Take 1 tablet by mouth daily.    [provider]  testosterone cypionate (DEPOTESTOSTERONE CYPIONATE) 200 MG/ML injection Inject 1 mL into the muscle every 14 (fourteen) days.  01/07/17   [provider]    Family History Family History  Problem Relation Age of Onset  . Cancer Mother   . Heart disease Father        rheumatic heart disease s/p transplant  . Hypertension Brother     Social History Social History   Tobacco Use  . Smoking status: Never Smoker  . Smokeless tobacco: Never Used  Substance Use Topics  . Alcohol use: No  . Drug use: No     Allergies   Cleocin [clindamycin hcl]; Doxycycline; and Tetracycline   Review of Systems Review of Systems  Constitutional: Negative for activity change, chills, diaphoresis, fatigue and fever.  HENT: Negative for congestion and rhinorrhea.   Eyes: Negative for visual disturbance.  Respiratory: Negative for cough, chest tightness, shortness of breath, wheezing and stridor.  Cardiovascular: Negative for chest pain, palpitations and leg swelling.  Gastrointestinal: Negative for abdominal distention, abdominal pain, blood in stool, constipation, diarrhea, nausea and vomiting.  Genitourinary: Negative for difficulty urinating, dysuria and flank pain.  Musculoskeletal: Negative for back pain and gait problem.  Skin: Positive for wound. Negative for rash.  Neurological: Negative for dizziness, weakness, light-headedness, numbness and headaches.    Psychiatric/Behavioral: Negative for agitation.  All other systems reviewed and are negative.    Physical Exam Updated Vital Signs BP (!) 156/97 (BP Location: Right Arm)   Pulse 88   Temp 98.2 F (36.8 C) (Oral)   Resp 18   Ht 6' (1.829 m)   Wt 104.3 kg   SpO2 96%   BMI 31.19 kg/m   Physical Exam Vitals signs and nursing note reviewed.  Constitutional:      General: He is not in acute distress.    Appearance: He is well-developed. He is not ill-appearing.  HENT:     Head: Normocephalic and atraumatic.     Nose: No congestion or rhinorrhea.  Eyes:     Conjunctiva/sclera: Conjunctivae normal.  Neck:     Musculoskeletal: Neck supple.  Cardiovascular:     Rate and Rhythm: Normal rate and regular rhythm.     Heart sounds: No murmur.  Pulmonary:     Effort: Pulmonary effort is normal. No respiratory distress.  Abdominal:     Palpations: Abdomen is soft.     Tenderness: There is no abdominal tenderness.  Musculoskeletal:        General: Tenderness present.     Left hand: He exhibits tenderness and laceration. He exhibits normal range of motion, normal capillary refill, no deformity and no swelling. Normal sensation noted. Normal strength noted.       Hands:  Skin:    General: Skin is warm and dry.     Capillary Refill: Capillary refill takes less than 2 seconds.     Findings: No lesion or rash.  Neurological:     Mental Status: He is alert.      ED Treatments / Results  Labs (all labs ordered are listed, but only abnormal results are displayed) Labs Reviewed  RAPID HIV SCREEN (HIV 1/2 AB+AG)  COMPREHENSIVE METABOLIC PANEL  HEPATITIS C ANTIBODY  HEPATITIS B SURFACE ANTIGEN  RPR    EKG None  Radiology No results found.  Procedures Procedures (including critical care time)  Medications Ordered in ED Medications  elvitegravir-cobicistat-emtricitabine-tenofovir (GENVOYA) 150-150-200-10 Prepack 5 tablet (5 tablets Oral Provided for home use 07/08/18  0137)  elvitegravir-cobicistat-emtricitabine-tenofovir (GENVOYA) 150-150-200-10 MG tablet (1 tablet  Given 07/08/18 0137)  Tdap (BOOSTRIX) injection 0.5 mL (0.5 mLs Intramuscular Given 07/08/18 0138)     Initial Impression / Assessment and Plan / ED Course  I have reviewed the triage vital signs and the nursing notes.  Pertinent labs & imaging results that were available during my care of the patient were reviewed by me and considered in my medical decision making (see chart for details).     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  Patient reports that he is left-handed.  He was searching a suspect this evening when he was poked by a razor blade in the suspects pocket.  The suspect does report to heroin use.  It was not a needle that he was poked with however it is unclear if the patient  has HIV or hepatitis or other infectious diseases at this time.  Patient reports the stick was several hours ago and he wants to make sure that he gets any prophylactic treatment that he needs for the post exposure.  Patient reports that his thumb had the small, 1 mm poke and is having no pain at this time.  He is unsure of his last tetanus vaccination.  Denies other symptoms.  On exam, patient has a 1 mm puncture wound to his left thumb.  It is hemostatic.  Nontender.  Normal sensation and grip strength.  Normal pulses.  Normal otherwise appearance of the skin.  Exam otherwise unremarkable.  The exposure protocol was activated and given the patient's uncertain HIV status or other infectious status as well as the known heroin use, patient is at risk for HIV.  Patient will be started on the prophylaxis protocol and was given a dose of antiviral.  He will be given 4 doses to go home with for the next 4 days.  He will give prescription for the next 4 weeks and will follow-up with PCP for  repeat HIV testing.  The protocol also drew blood for hepatitis and other infectious disease.  Patient will be updated on his tetanus.  Patient's wound was further cleansed and washed.  Anticipate discharge home after blood draw and medication administration.  Patient has blood drawn and was sent off.  Spoke with pharmacy to confirm plan of care.  Patient given prescription for 30-day supply of antiviral's and will follow-up with Worker's Comp. physician, PCP, and this facility if any symptoms change or worsen.  Patient and coworker had no other questions or concerns and patient was discharged in good condition.   Final Clinical Impressions(s) / ED Diagnoses   Final diagnoses:  History of exposure to hazardous bodily fluids  Contact with razor blade as cause of injury with undetermined intent of harm  Puncture wound of left thumb, initial encounter    ED Discharge Orders         Ordered    elvitegravir-cobicistat-emtricitabine-tenofovir (GENVOYA) 150-150-200-10 MG TABS tablet  Daily with breakfast     07/08/18 0124          Clinical Impression: 1. History of exposure to hazardous bodily fluids   2. Contact with razor blade as cause of injury with undetermined intent of harm   3. Puncture wound of left thumb, initial encounter     Disposition: Discharge  Condition: Good  I have discussed the results, Dx and Tx plan with the pt(& family if present). He/she/they expressed understanding and agree(s) with the plan. Discharge instructions discussed at great length. Strict return precautions discussed and pt &/or family have verbalized understanding of the instructions. No further questions at time of discharge.    New Prescriptions   ELVITEGRAVIR-COBICISTAT-EMTRICITABINE-TENOFOVIR (GENVOYA) 150-150-200-10 MG TABS TABLET    Take 1 tablet by mouth daily with breakfast.    Follow Up: [name], MD [place] [address] [place] [address] [id number] [address] [place] [phone]       Jasara Corrigan, Gwenyth Allegra, MD 07/08/18 [phone]

## 2018-07-09 ENCOUNTER — Ambulatory Visit (HOSPITAL_BASED_OUTPATIENT_CLINIC_OR_DEPARTMENT_OTHER): Payer: 59 | Attending: Internal Medicine | Admitting: Cardiology

## 2018-07-09 VITALS — Ht 72.0 in | Wt 230.0 lb

## 2018-07-09 DIAGNOSIS — G4730 Sleep apnea, unspecified: Secondary | ICD-10-CM | POA: Insufficient documentation

## 2018-07-09 LAB — RPR: RPR: NONREACTIVE

## 2018-07-09 LAB — HEPATITIS B SURFACE ANTIGEN: HEP B S AG: NEGATIVE

## 2018-07-09 LAB — HEPATITIS C ANTIBODY

## 2018-07-18 NOTE — Procedures (Signed)
Patient Name: Michael Choi, Michael Choi Date: 07/09/2018 Gender: Male D.O.B: 1960-12-28 Age (years): 57 Referring Provider: Sherryl Manges Height (inches): 70 Interpreting Physician: Armanda Magic MD, ABSM Weight (lbs): 230 RPSGT: Cherylann Parr BMI: 33 MRN: [id number] Neck Size: 18.00  CLINICAL INFORMATION Sleep Study Type: NPSG  Indication for sleep study: Fatigue  Epworth Sleepiness Score: 14  SLEEP STUDY TECHNIQUE As per the AASM Manual for the Scoring of Sleep and Associated Events v2.3 (April 2016) with a hypopnea requiring 4% desaturations.  The channels recorded and monitored were frontal, central and occipital EEG, electrooculogram (EOG), submentalis EMG (chin), nasal and oral airflow, thoracic and abdominal wall motion, anterior tibialis EMG, snore microphone, electrocardiogram, and pulse oximetry.  MEDICATIONS Medications self-administered by patient taken the night of the study : N/A  SLEEP ARCHITECTURE The study was initiated at 10:01:01 PM and ended at 4:01:41 AM.  Sleep onset time was 32.3 minutes and the sleep efficiency was 78.2%%. The total sleep time was 282 minutes.  Stage REM latency was 48.5 minutes.  The patient spent 3.4%% of the night in stage N1 sleep, 79.6%% in stage N2 sleep, 0.0%% in stage N3 and 17% in REM.  Alpha intrusion was absent.  Supine sleep was 25.89%.  RESPIRATORY PARAMETERS The overall apnea/hypopnea index (AHI) was 7.0 per hour. There were 0 total apneas, including 0 obstructive, 0 central and 0 mixed apneas. There were 33 hypopneas and 0 RERAs.  The AHI during Stage REM sleep was 15.0 per hour.  AHI while supine was 17.3 per hour.  The mean oxygen saturation was 93.7%. The minimum SpO2 during sleep was 84.0%.  moderate snoring was noted during this study.  CARDIAC DATA The 2 lead EKG demonstrated sinus rhythm. The mean heart rate was 72.2 beats per minute. Other EKG findings include: PVCs.  LEG MOVEMENT DATA The total PLMS were  0 with a resulting PLMS index of 0.0. Associated arousal with leg movement index was 0.0 .  IMPRESSIONS - Mild obstructive sleep apnea occurred during this study (AHI = 7.0/h). - No significant central sleep apnea occurred during this study (CAI = 0.0/h). - Mild oxygen desaturation was noted during this study (Min O2 = 84.0%). - The patient snored with moderate snoring volume. - EKG findings include PVCs. - Clinically significant periodic limb movements did not occur during sleep. No significant associated arousals.  DIAGNOSIS - Obstructive Sleep Apnea (327.23 [G47.33 ICD-10]) - Nocturnal Hypoxemia (327.26 [G47.36 ICD-10])  RECOMMENDATIONS - Although sleep apnea is only mild overall it is moderate during REM sleep with nocturnal hypoxemia and therefore recommend CPAP titration. - Positional therapy avoiding supine position during sleep. - Avoid alcohol, sedatives and other CNS depressants that may worsen sleep apnea and disrupt normal sleep architecture. - Sleep hygiene should be reviewed to assess factors that may improve sleep quality. - Weight management and regular exercise should be initiated or continued if appropriate.  [Electronically signed] 07/18/2018 08:23 PM  Armanda Magic MD, ABSM Diplomate, American Board of Sleep Medicine

## 2018-07-19 ENCOUNTER — Telehealth: Payer: Self-pay | Admitting: *Deleted

## 2018-07-19 DIAGNOSIS — G4730 Sleep apnea, unspecified: Secondary | ICD-10-CM

## 2018-07-19 NOTE — Telephone Encounter (Signed)
-----   Message from Sueanne Margarita, MD sent at 07/18/2018  8:38 PM EST ----- Please let patient know that they have sleep apnea and recommend CPAP titration. Please set up titration in the sleep lab.

## 2018-07-21 DIAGNOSIS — S8991XA Unspecified injury of right lower leg, initial encounter: Secondary | ICD-10-CM | POA: Diagnosis not present

## 2018-07-21 DIAGNOSIS — M79661 Pain in right lower leg: Secondary | ICD-10-CM | POA: Diagnosis not present

## 2018-07-22 NOTE — Telephone Encounter (Signed)
Informed patient of sleep study results and patient understanding was verbalized. Patient understands his sleep study showed  they have sleep apnea and recommend CPAP titration. Please set up titration in the sleep lab.   Pt is aware and agreeable to results. Patient states he will call back at a later date to have the titration scheduled as he is training for a new position and does not know his schedule. Patient was advised the test are time sensitive and to call back in a timely manner.

## 2018-07-22 NOTE — Telephone Encounter (Signed)
Noted  

## 2018-07-23 NOTE — Progress Notes (Deleted)
Cardiology Office Note Date:  07/26/2018  Patient ID:  Michael, Choi July 05, 1961, MRN [id number] PCP:  Carolynn Serve, MD  Cardiologist: Dr. Jacinto Halim Electrophysiologist: Dr. Graciela Husbands    Chief Complaint: planned f/u  History of Present Illness: Michael Choi is a 58 y.o. male with history of AFlutter ablated 03/05/18, HTN, NICM.  He comes in today to be seen for Der. Graciela Husbands.  He last saw him in Oct 2019, at that time feeling improved post flutter ablation, though had persistent DOE with some associated chest pressure.  His CM was suspect to be tachy-mediated.  He was planned for f/u TTE post ablation and CTA coronaries.  He wondered if his symptoms in full or in part may be 2/2 his polycythemia and deferred to the PMD who the patient was scheduled to see for any further evaluation or referral to hematology.  He also discussed historical orthostatic symptoms that improved off amlodipine, with plans to monitor his BP.  Thoughts towards medicine adjustment should he have a persistent CM noting an isolated K+ of 5.0 recently.  He was thought to be mildly fluid OL with a salt liberal diet.  He was started on lasix 20mg  daily for 3 days and counseled on low salt diet.  He also planned for sleep study.  And lastly, the patient was already scheduled for neurology consultation for memory issues, Dr. Graciela Husbands continued his Eliquis until that was completed with thoughts if evidence of stroke continuation of a/c would be recommended.  06/07/18 Coronary CT with ca++ score of zero, minimal luminal irregularities, findings c/w NICM (no significant radiology findings on over-read) 06/16/18 TTE noted LVEF 40-45%, grade II DD (May 2019 LVEF 30-35%) His most recent BMET 07/08/18 noted K+ 3.5, BUN/Creat 13/1.34 Sleep study is completed, pending read  Since last being seen in clinic, he reports doing *** well.  He denies chest pain, shortness of breath, LE edema, dizziness, syncope.     Past Medical History:  Diagnosis Date   . Atrial flutter (HCC)   . Hypertension   . Melanoma [facility])     Past Surgical History:  Procedure Laterality Date  . A-FLUTTER ABLATION N/A 03/05/2018   Procedure: A-FLUTTER ABLATION;  Surgeon: Duke Salvia, MD;  Location: [facility] INVASIVE CV LAB;  Service: Cardiovascular;  Laterality: N/A;  . CARDIOVERSION N/A 11/27/2017   Procedure: CARDIOVERSION;  Surgeon: Elder Negus, MD;  Location: [facility] ENDOSCOPY;  Service: Cardiovascular;  Laterality: N/A;  . KNEE ARTHROSCOPY    . TEE WITHOUT CARDIOVERSION N/A 11/27/2017   Procedure: TRANSESOPHAGEAL ECHOCARDIOGRAM (TEE);  Surgeon: Elder Negus, MD;  Location: [facility] ENDOSCOPY;  Service: Cardiovascular;  Laterality: N/A;    Current Outpatient Medications  Medication Sig Dispense Refill  . amLODipine (NORVASC) 5 MG tablet Take 5 mg by mouth daily.  3  . ELIQUIS 5 MG TABS tablet TAKE 1 TABLET BY MOUTH TWICE A DAY 60 tablet 11  . elvitegravir-cobicistat-emtricitabine-tenofovir (GENVOYA) 150-150-200-10 MG TABS tablet Take 1 tablet by mouth daily with breakfast. 30 tablet 0  . furosemide (LASIX) 20 MG tablet Take 1 tablet (20 mg total) by mouth daily. For 3 days 5 tablet 0  . losartan (COZAAR) 50 MG tablet Take 50 mg by mouth daily.  3  . metoprolol tartrate (LOPRESSOR) 100 MG tablet Take 1 tablet (100 mg total) two hours prior to cardiac CT test 1 tablet 0  . multivitamin (THERAGRAN) per tablet Take 1 tablet by mouth daily.    Marland Kitchen testosterone cypionate (DEPOTESTOSTERONE CYPIONATE)  200 MG/ML injection Inject 1 mL into the muscle every 14 (fourteen) days.      No current facility-administered medications for this visit.     Allergies:   Cleocin [clindamycin hcl]; Doxycycline; and Tetracycline   Social History:  The patient  reports that he has never smoked. He has never used smokeless tobacco. He reports that he does not drink alcohol or use drugs.   Family History:  The patient's family history includes Cancer in his mother; Heart disease in  his father; Hypertension in his brother.  ROS:  Please see the history of present illness.    All other systems are reviewed and otherwise negative.   PHYSICAL EXAM: *** VS:  There were no vitals taken for this visit. BMI: There is no height or weight on file to calculate BMI. Well nourished, well developed, in no acute distress  HEENT: normocephalic, atraumatic  Neck: no JVD, carotid bruits or masses Cardiac: *** RRR; no significant murmurs, no rubs, or gallops Lungs: *** CTA b/l, no wheezing, rhonchi or rales  Abd: soft, nontender MS: no deformity or *** atrophy Ext: *** no edema  Skin: warm and dry, no rash Neuro:  No gross deficits appreciated Psych: euthymic mood, full affect   EKG:  Not done today  06/07/18: Coronary CTA IMPRESSION: 1. Coronary calcium score of 0. This was 0 percentile for age and sex matched control. 2. Normal coronary origin with right dominance. 3. Minimal luminal irregularities, no CAD. Findings consistent with no-ischemic cardiomyopathy  Radiology over-read IMPRESSION: 1.  Aortic Atherosclerosis (ICD10-I70.0).   06/16/18: TTE Study Conclusions - Left ventricle: The cavity size was moderately to severely   dilated. Wall thickness was normal. Systolic function was mildly   to moderately reduced. The estimated ejection fraction was in the   range of 40% to 45%. Diffuse hypokinesis. Although no diagnostic   regional wall motion abnormality was identified, this possibility   cannot be completely excluded on the basis of this study.   Features are consistent with a pseudonormal left ventricular   filling pattern, with concomitant abnormal relaxation and   increased filling pressure (grade 2 diastolic dysfunction). - Aortic valve: Transvalvular velocity was within the normal range.   There was no stenosis. There was trivial regurgitation. Mean   gradient (S): 5 mm Hg. Valve area (VTI): 2.05 cm^2. - Mitral valve: Transvalvular velocity was within  the normal range.   There was no evidence for stenosis. There was trivial   regurgitation. - Left atrium: The atrium was mildly dilated. - Right ventricle: The cavity size was mildly dilated. Wall   thickness was normal. Systolic function was normal. - Right atrium: The atrium was mildly dilated. - Tricuspid valve: There was trivial regurgitation. - Pulmonary arteries: Systolic pressure could not be accurately   estimated. - Inferior vena cava: The vessel was normal in size. The   respirophasic diameter changes were in the normal range (>= 50%),   consistent with normal central venous pressure. - Pericardium, extracardiac: There was no pericardial effusion. Impressions: - Compared to the TEE 11/27/2017, the rhythm is now sinus with   ectopy. Limited images available for side by side comparison.   11/27/17: TEE Study Conclusions - Left ventricle: The estimated ejection fraction was in the range   of 30% to 35%. Recommend follow up transthoracic echocardiogram   in sinus rhythm for accurate LVEF evaluation. No evidence of   thrombus. - Left atrium: No evidence of thrombus in the atrial cavity or  appendage. No evidence of thrombus in the atrial cavity or   appendage. Impressions: - Successful cardioversion. No cardiac source of emboli was   indentified.  Recent Labs: 11/27/2017: TSH 0.926 05/19/2018: Hemoglobin 18.1; Platelets 291 07/08/2018: ALT 31; BUN 13; Creatinine, Ser 1.34; Potassium 3.5; Sodium 136  11/27/2017: VLDL 13 05/19/2018: Chol/HDL Ratio 3.6; Cholesterol, Total 158; HDL 44; LDL Calculated 100; Triglycerides 71   CrCl cannot be calculated (Unknown ideal weight.).   Wt Readings from Last 3 Encounters:  07/09/18 230 lb (104.3 kg)  07/08/18 230 lb (104.3 kg)  05/19/18 245 lb 9.6 oz (111.4 kg)     Other studies reviewed: Additional studies/records reviewed today include: summarized above  ASSESSMENT AND PLAN:  1. Atrial flutter S/p ablation CHA2DSD2vasc is  2, on Eliquis, appropriately dosed I do not note any h/o AFib *** neuro w/u  2. HTN Stable No change required today      3. NICM LVEF has improved some to 40-45% No significant CAD by CTa Follow up with Dr Jacinto Halim ***  4.  OSA Newly diagnosed Pending CPAP titration       Disposition: F/u with Dr Graciela Husbands 6 months   Current medicines are reviewed at length with the patient today.  The patient did not have any concerns regarding medicines.  Signed, Gypsy Balsam, NP 07/27/2018 7:14 AM  [facility] [address] [place] [address] [phone] (office)  [phone] (fax)

## 2018-07-27 ENCOUNTER — Ambulatory Visit: Payer: 59 | Admitting: Nurse Practitioner

## 2018-08-06 ENCOUNTER — Emergency Department (HOSPITAL_COMMUNITY)
Admission: EM | Admit: 2018-08-06 | Discharge: 2018-08-06 | Disposition: A | Discharge status: Home / Self Care | Payer: 59 | Attending: Emergency Medicine | Admitting: Emergency Medicine

## 2018-08-06 DIAGNOSIS — Z5321 Procedure and treatment not carried out due to patient leaving prior to being seen by health care provider: Secondary | ICD-10-CM | POA: Insufficient documentation

## 2018-08-06 DIAGNOSIS — N4889 Other specified disorders of penis: Secondary | ICD-10-CM | POA: Diagnosis not present

## 2018-08-06 DIAGNOSIS — N529 Male erectile dysfunction, unspecified: Secondary | ICD-10-CM | POA: Diagnosis not present

## 2018-08-06 NOTE — ED Notes (Signed)
Erection since 1030

## 2018-08-06 NOTE — ED Notes (Signed)
Called pt X2. No answer

## 2018-08-06 NOTE — ED Notes (Signed)
Pt called x 3 with no answer

## 2018-08-06 NOTE — ED Triage Notes (Signed)
Pt here from home with c/ o a sustained erection after getting a shot into his penis at his MD office , pt states that it getting better but not completely

## 2018-08-25 ENCOUNTER — Telehealth: Payer: Self-pay | Admitting: Internal Medicine

## 2018-08-25 NOTE — Telephone Encounter (Signed)
Patient Walk-In : Patient needs a letter from Dr. [name] for his [place] license. Please leave a message if patient does not answer when called.

## 2018-08-26 NOTE — Telephone Encounter (Signed)
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

## 2018-10-20 ENCOUNTER — Telehealth: Payer: Self-pay | Admitting: Internal Medicine

## 2018-10-20 NOTE — Telephone Encounter (Signed)
New Message             Patient is calling in about ifnformation about his A-fib. 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

## 2018-10-20 NOTE — Telephone Encounter (Signed)
LVM for return call. 

## 2018-10-21 NOTE — Telephone Encounter (Signed)
Pt called back returning your call °

## 2018-10-25 NOTE — Telephone Encounter (Signed)
Spoke with pt who is requesting a letter from Dr [name] stating it is normal for him to have an irregular rhythm. Pt states his DOT MD heard an irregular rhythm upon auscultation during his visit last week. Pt states Dr [name] mentioned he may have an irregular HR after his AFL ablation. Pt denies any knowledge pr symptoms of his heart feeling out of rhythm.  Per Dr Olin Pia last OV note, pt has frequent PACs. Pt believes perhaps this is what was noted upon his DOT physical. I advised pt I would discuss with Dr [name] upon his return next week and I would contact him back.   Pt verbalized understanding and had no additional questions.

## 2018-11-01 NOTE — Telephone Encounter (Signed)
Pt has known irregular heart beats have documented PACs   We can write a note saying that

## 2018-12-08 ENCOUNTER — Telehealth: Payer: Self-pay | Admitting: Internal Medicine

## 2018-12-08 NOTE — Telephone Encounter (Signed)
Spoke with Michael Choi a PA with [facility] and he has seen the pt and the pt noted he lost his job and could not afford his Eliquis has stopped it on his own a month ago due to financial reasons.. he was ablated 02/2018... he would like to talk with the anticoagulation clinic [place] about possibly switching the pt to Coumadin.. I advised him that I will forward the information to them and see if they can call him back this afternoon.   See 04/2018 note from [facility] re: Neuro questions and continuing Eliquis.   Dr. [name] is off in the P.M today.   [phone]

## 2018-12-08 NOTE — Telephone Encounter (Signed)
Spoke to [place], [place]. Patient lost insurance and stopped taking Eliquis. It appears as though there was some question as to whether patient still needs anticoagulation after his ablation.  No documentation that patient ever saw neurology (was trying to determine if patient had a stroke). CHADS2-VASc of 2.  Patient will need to be transitioned to coumadin if anticoagulation is resumed. Or if he lost his job due to [place] [employer], he can actually get Eliquis free from the drug company.  I will defer to Dr. [name] for ultimate decision on whether he needs to continue anticoagulation.  Alex made aware that Dr. [name] is off this afternoon. Looks like he will be back tomorrow afternoon. I will reach back out to [facility] once I hear from Dr. [name].

## 2018-12-08 NOTE — Telephone Encounter (Signed)
New Message    Cristie Hem is a PA from [facility] and would like a nurse to call him back about the pt He has some things he would like to discuss   Please call

## 2018-12-14 NOTE — Telephone Encounter (Signed)
Spoke with Dr. [name] to follow up as Toma Aran, PA with [facility] left message and he said without evidence of Afib after ablation ok to stop anticoagulation. He never followed up with neuro but based on above ok to stop it.   Spoke with Cristie Hem and made aware of recs.   Spoke with patient and he is aware to stop Eliquis.   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 Advised that I would defer to Dr. [name] about letter and ability to work. He states understanding.  I also assisted him with setting up Mychart so that he can communicate with our office via mychart message.

## 2019-01-13 NOTE — Telephone Encounter (Signed)
Spoke with pt and he was able to located the letter in his MyChart. He would like it emailed, but I explained we cannot e-mail documents if they are part of his chart. Pt may print off the letter and scan it how he chooses.  He verbalized understanding and had no additional questions.

## 2019-01-13 NOTE — Telephone Encounter (Signed)
Follow up   Patient needs to know where to find the letter in my chart per the previous message. Please call.

## 2019-01-26 ENCOUNTER — Telehealth: Payer: Self-pay | Admitting: Internal Medicine

## 2019-01-26 NOTE — Telephone Encounter (Signed)
New Message:   Please have Dr [name] call when he gets here,  Shirleen Schirmer, PA from [place]). This is concerning pt, Michael Choi please.Marland Kitchen

## 2019-01-26 NOTE — Telephone Encounter (Signed)
Called x2 and left voicemail for return call.

## 2019-01-27 ENCOUNTER — Telehealth: Payer: Self-pay | Admitting: Internal Medicine

## 2019-01-27 NOTE — Telephone Encounter (Signed)
Please have Dr [name] call when able,  Shirleen Schirmer, PA from [facility]).   Patient was seen 2 days ago and in the back of the right eye patient has a branch retinol vein occulusion, he stopped his eliquis back in february and unsure if Dr. [name] knows that.   Gwenlyn Perking also stated that whomever returns the call ask the receptionist to get him out of a patient room due to playing phone tag yesterday.

## 2019-01-27 NOTE — Telephone Encounter (Signed)
Spoke w ophthomolgy PA  Branch reitinal vein occlusion is not, per him or upto date related to thromboembolism  No indication for anticoagulation

## 2019-01-27 NOTE — Telephone Encounter (Signed)
To Dr. [name].

## 2019-03-05 ENCOUNTER — Encounter (HOSPITAL_COMMUNITY): Payer: Self-pay | Admitting: Emergency Medicine

## 2019-03-05 ENCOUNTER — Emergency Department (HOSPITAL_COMMUNITY)
Admission: EM | Admit: 2019-03-05 | Discharge: 2019-03-06 | Disposition: A | Discharge status: Home / Self Care | Payer: Self-pay | Attending: Emergency Medicine | Admitting: Emergency Medicine

## 2019-03-05 ENCOUNTER — Other Ambulatory Visit: Payer: Self-pay

## 2019-03-05 DIAGNOSIS — R0602 Shortness of breath: Secondary | ICD-10-CM | POA: Insufficient documentation

## 2019-03-05 DIAGNOSIS — I480 Paroxysmal atrial fibrillation: Secondary | ICD-10-CM | POA: Insufficient documentation

## 2019-03-05 DIAGNOSIS — Z79899 Other long term (current) drug therapy: Secondary | ICD-10-CM | POA: Insufficient documentation

## 2019-03-05 DIAGNOSIS — I10 Essential (primary) hypertension: Secondary | ICD-10-CM | POA: Insufficient documentation

## 2019-03-05 NOTE — ED Triage Notes (Signed)
Pt states this morning he noticed his heart was in a-fib. Pt states a year ago same thing happened and he had to have an ablation on heart. Denies pain

## 2019-03-06 ENCOUNTER — Other Ambulatory Visit: Payer: Self-pay

## 2019-03-06 ENCOUNTER — Emergency Department (HOSPITAL_COMMUNITY): Payer: Self-pay

## 2019-03-06 ENCOUNTER — Encounter (HOSPITAL_COMMUNITY): Payer: Self-pay | Admitting: Emergency Medicine

## 2019-03-06 ENCOUNTER — Emergency Department (HOSPITAL_COMMUNITY)
Admission: EM | Admit: 2019-03-06 | Discharge: 2019-03-06 | Disposition: A | Discharge status: Home / Self Care | Payer: Self-pay | Attending: Emergency Medicine | Admitting: Emergency Medicine

## 2019-03-06 DIAGNOSIS — Z7901 Long term (current) use of anticoagulants: Secondary | ICD-10-CM | POA: Insufficient documentation

## 2019-03-06 DIAGNOSIS — Z79899 Other long term (current) drug therapy: Secondary | ICD-10-CM | POA: Insufficient documentation

## 2019-03-06 DIAGNOSIS — I10 Essential (primary) hypertension: Secondary | ICD-10-CM | POA: Insufficient documentation

## 2019-03-06 DIAGNOSIS — I4891 Unspecified atrial fibrillation: Secondary | ICD-10-CM | POA: Insufficient documentation

## 2019-03-06 LAB — TROPONIN I (HIGH SENSITIVITY)
Troponin I (High Sensitivity): 10 ng/L (ref <18)
Troponin I (High Sensitivity): 10 ng/L (ref <18)

## 2019-03-06 LAB — CBC WITH DIFFERENTIAL/PLATELET
Abs Immature Granulocytes: 0.03 10*3/uL (ref 0.00–0.07)
Basophils Absolute: 0 10*3/uL (ref 0.0–0.1)
Basophils Relative: 0 %
Eosinophils Absolute: 0.3 10*3/uL (ref 0.0–0.5)
Eosinophils Relative: 4 %
HCT: 53 % — ABNORMAL HIGH (ref 39.0–52.0)
Hemoglobin: 17.6 g/dL — ABNORMAL HIGH (ref 13.0–17.0)
Immature Granulocytes: 0 %
Lymphocytes Relative: 33 %
Lymphs Abs: 2.5 10*3/uL (ref 0.7–4.0)
MCH: 30.4 pg (ref 26.0–34.0)
MCHC: 33.2 g/dL (ref 30.0–36.0)
MCV: 91.7 fL (ref 80.0–100.0)
Monocytes Absolute: 1.1 10*3/uL — ABNORMAL HIGH (ref 0.1–1.0)
Monocytes Relative: 14 %
Neutro Abs: 3.7 10*3/uL (ref 1.7–7.7)
Neutrophils Relative %: 49 %
Platelets: 252 10*3/uL (ref 150–400)
RBC: 5.78 MIL/uL (ref 4.22–5.81)
RDW: 13.4 % (ref 11.5–15.5)
WBC: 7.7 10*3/uL (ref 4.0–10.5)
nRBC: 0 % (ref 0.0–0.2)

## 2019-03-06 LAB — COMPREHENSIVE METABOLIC PANEL
ALT: 43 U/L (ref 0–44)
AST: 33 U/L (ref 15–41)
Albumin: 4.1 g/dL (ref 3.5–5.0)
Alkaline Phosphatase: 47 U/L (ref 38–126)
Anion gap: 7 (ref 5–15)
BUN: 18 mg/dL (ref 6–20)
CO2: 26 mmol/L (ref 22–32)
Calcium: 9.1 mg/dL (ref 8.9–10.3)
Chloride: 106 mmol/L (ref 98–111)
Creatinine, Ser: 1.07 mg/dL (ref 0.61–1.24)
GFR calc Af Amer: >60 mL/min (ref >60)
GFR calc non Af Amer: >60 mL/min (ref >60)
Glucose, Bld: 100 mg/dL — ABNORMAL HIGH (ref 70–99)
Potassium: 4.2 mmol/L (ref 3.5–5.1)
Sodium: 139 mmol/L (ref 135–145)
Total Bilirubin: 0.6 mg/dL (ref 0.3–1.2)
Total Protein: 7.6 g/dL (ref 6.5–8.1)

## 2019-03-06 LAB — BRAIN NATRIURETIC PEPTIDE: B Natriuretic Peptide: 128 pg/mL — ABNORMAL HIGH (ref 0.0–100.0)

## 2019-03-06 LAB — PROTIME-INR
INR: 1.1 (ref 0.8–1.2)
Prothrombin Time: 13.8 seconds (ref 11.4–15.2)

## 2019-03-06 LAB — D-DIMER, QUANTITATIVE: D-Dimer, Quant: 0.28 ug/mL-FEU (ref 0.00–0.50)

## 2019-03-06 MED ORDER — APIXABAN 5 MG PO TABS
5.0000 mg | ORAL_TABLET | Freq: Once | ORAL | Status: AC
  Administered 2019-03-06: 02:00:00 5 mg via ORAL
  Filled 2019-03-06: qty 1

## 2019-03-06 MED ORDER — FUROSEMIDE 20 MG PO TABS: 20.0000 mg | ORAL_TABLET | Freq: Every day | ORAL | 0 refills | Status: DC

## 2019-03-06 MED ORDER — HYDROCODONE-ACETAMINOPHEN 5-325 MG PO TABS
1.0000 | ORAL_TABLET | Freq: Once | ORAL | Status: AC
  Administered 2019-03-06: 1 via ORAL
  Filled 2019-03-06: qty 1

## 2019-03-06 MED ORDER — FUROSEMIDE 10 MG/ML IJ SOLN
20.0000 mg | Freq: Once | INTRAMUSCULAR | Status: AC
  Administered 2019-03-06: 01:00:00 20 mg via INTRAVENOUS
  Filled 2019-03-06: qty 2

## 2019-03-06 MED ORDER — POTASSIUM CHLORIDE CRYS ER 20 MEQ PO TBCR: 40.0000 meq | EXTENDED_RELEASE_TABLET | Freq: Once | ORAL | Status: DC

## 2019-03-06 MED ORDER — APIXABAN 5 MG PO TABS: 5.0000 mg | ORAL_TABLET | Freq: Two times a day (BID) | ORAL | 0 refills | Status: DC

## 2019-03-06 MED ORDER — DILTIAZEM HCL 25 MG/5ML IV SOLN
10.0000 mg | Freq: Once | INTRAVENOUS | Status: AC
  Administered 2019-03-06: 10 mg via INTRAVENOUS
  Filled 2019-03-06: qty 5

## 2019-03-06 MED ORDER — DILTIAZEM HCL 100 MG IV SOLR
5.0000 mg/h | INTRAVENOUS | Status: DC
  Administered 2019-03-06: 01:00:00 5 mg/h via INTRAVENOUS
  Filled 2019-03-06: qty 100

## 2019-03-06 NOTE — Discharge Instructions (Addendum)
Call Dr. [name] on Monday.  You should start the blood thinner because your annual risk of stroke is approximately 2%..  If you want to hold this until you have your dental surgery next week that is your decision.  Call your dentist and let them know about your potential blood thinner use.  Return to the ED with chest pain, shortness of breath, any other concerns.

## 2019-03-06 NOTE — ED Triage Notes (Addendum)
Patient discharged from ED last night after coming in for afib with rate of 140s. Patient placed on Cardizem drip and rate decreased to 60 and he was discharged to follow-up with cardiologist on Monday. Patient reports waking up with increased heart rate and shortness of breath again. Patient has history of afib with ablation and cardioversion prior to yesterday.

## 2019-03-06 NOTE — ED Notes (Signed)
Ambulated around nurse station. Tolerated well.  o2 sats remained 95%. Denies any cp, sob, dizziness.

## 2019-03-06 NOTE — ED Notes (Signed)
Attempting to contanct [facility]. Was on hold for 30 minutes. Hung up and will attempt to call back. Dr Wyvonnia Dusky notified.

## 2019-03-06 NOTE — Discharge Instructions (Addendum)
Return for rapid heart rate lasting over 40 minutes.  No signs of any oxygen problems here today.  Make an appointment to follow-up with cardiology Dr. [name].  Return for any new or worse symptoms.

## 2019-03-06 NOTE — ED Provider Notes (Signed)
[facility] EMERGENCY DEPARTMENT Provider Note   CSN: [id number] Arrival date & time: 03/06/19  1051    History   Chief Complaint Chief Complaint  Patient presents with  . Irregular Heart Beat    HPI Michael Choi is a 58 y.o. male.     Patient just discharged from the emergency department earlier today.  He was seen for concerns for palpitation and rapid heart rate.  Known to have a history of atrial fib atrial flutter.  Patient had fairly extensive work-up to include chest x-ray and also had a negative d-dimer.  Patient returns with concerns about rapid heart rate and palpitations.  The had a long discussion with cardiology and he was to follow-up with cardiology they recommend he start his Eliquis again.  The patient does want to do that because he is got dental surgery coming.  Patient denies any new or worse symptoms.     Past Medical History:  Diagnosis Date  . A-fib ([place])   . Atrial flutter ([place])   . Atypical nevus 04/16/2016   left outer back (mild)   . Hypertension   . Melanoma [facility])     Patient Active Problem List   Diagnosis Date Noted  . Atrial flutter ([place]) 11/26/2017  . Hypogonadism male 09/27/2017  . Secondary erythrocytosis 08/28/2017  . Prediabetes 04/03/2015  . HTN (hypertension) 10/13/2011  . Attention deficit hyperactivity disorder (ADHD) 04/02/2009    Past Surgical History:  Procedure Laterality Date  . A-FLUTTER ABLATION N/A 03/05/2018   Procedure: A-FLUTTER ABLATION;  Surgeon: [name], MD;  Location: [place] CV LAB;  Service: Cardiovascular;  Laterality: N/A;  . CARDIOVERSION N/A 11/27/2017   Procedure: CARDIOVERSION;  Surgeon: [name], MD;  Location: [facility] ENDOSCOPY;  Service: Cardiovascular;  Laterality: N/A;  . KNEE ARTHROSCOPY    . TEE WITHOUT CARDIOVERSION N/A 11/27/2017   Procedure: TRANSESOPHAGEAL ECHOCARDIOGRAM (TEE);  Surgeon: [name], MD;  Location: [facility] ENDOSCOPY;  Service: Cardiovascular;  Laterality:  N/A;        Home Medications    Prior to Admission medications   Medication Sig Start Date End Date Taking? Authorizing Provider  amLODipine (NORVASC) 5 MG tablet Take 5 mg by mouth daily. 12/31/16   [provider]  apixaban (ELIQUIS) 5 MG TABS tablet Take 1 tablet (5 mg total) by mouth 2 (two) times daily. 03/06/19 04/05/19  Rancour, Annie Main, MD  elvitegravir-cobicistat-emtricitabine-tenofovir (GENVOYA) 150-150-200-10 MG TABS tablet Take 1 tablet by mouth daily with breakfast. 07/08/18   Tegeler, Gwenyth Allegra, MD  furosemide (LASIX) 20 MG tablet Take 1 tablet (20 mg total) by mouth daily. 03/06/19   Rancour, Annie Main, MD  losartan (COZAAR) 50 MG tablet Take 50 mg by mouth daily. 01/20/17   [provider]  metoprolol tartrate (LOPRESSOR) 100 MG tablet Take 1 tablet (100 mg total) two hours prior to cardiac CT test 05/19/18   [name], MD  multivitamin [facility]) per tablet Take 1 tablet by mouth daily.    [provider]  testosterone cypionate (DEPOTESTOSTERONE CYPIONATE) 200 MG/ML injection Inject 1 mL into the muscle every 14 (fourteen) days.  01/07/17   [provider]    Family History Family History  Problem Relation Age of Onset  . Cancer Mother   . Heart disease Father        rheumatic heart disease s/p transplant  . Hypertension Brother     Social History Social History   Tobacco Use  . Smoking status: Never Smoker  .  Smokeless tobacco: Never Used  Substance Use Topics  . Alcohol use: No  . Drug use: No     Allergies   Doxycycline and Tetracycline   Review of Systems Review of Systems  Constitutional: Negative for chills and fever.  HENT: Negative for congestion, rhinorrhea and sore throat.   Eyes: Negative for visual disturbance.  Respiratory: Negative for cough and shortness of breath.   Cardiovascular: Positive for palpitations. Negative for chest pain and leg swelling.  Gastrointestinal: Negative for abdominal  pain, diarrhea, nausea and vomiting.  Genitourinary: Negative for dysuria.  Musculoskeletal: Negative for back pain and neck pain.  Skin: Negative for rash.  Neurological: Negative for dizziness, light-headedness and headaches.  Hematological: Does not bruise/bleed easily.  Psychiatric/Behavioral: Negative for confusion.     Physical Exam Updated Vital Signs BP (!) 144/112   Pulse 74   Temp 97.7 F (36.5 C)   Resp 18   Ht 1.803 m (5\' 11" )   Wt 111.1 kg   SpO2 97%   BMI 34.17 kg/m   Physical Exam Vitals signs and nursing note reviewed.  Constitutional:      Appearance: Normal appearance. He is well-developed.  HENT:     Head: Normocephalic and atraumatic.  Eyes:     Extraocular Movements: Extraocular movements intact.     Conjunctiva/sclera: Conjunctivae normal.     Pupils: Pupils are equal, round, and reactive to light.  Neck:     Musculoskeletal: Normal range of motion and neck supple.  Cardiovascular:     Rate and Rhythm: Normal rate. Rhythm irregular.     Heart sounds: No murmur.  Pulmonary:     Effort: Pulmonary effort is normal. No respiratory distress.     Breath sounds: Normal breath sounds.  Abdominal:     General: Bowel sounds are normal.     Palpations: Abdomen is soft.     Tenderness: There is no abdominal tenderness.  Musculoskeletal: Normal range of motion.  Skin:    General: Skin is warm and dry.     Capillary Refill: Capillary refill takes less than 2 seconds.  Neurological:     General: No focal deficit present.     Mental Status: He is alert and oriented to person, place, and time.      ED Treatments / Results  Labs (all labs ordered are listed, but only abnormal results are displayed) Labs Reviewed - No data to display  EKG None   ED ECG REPORT   Date: 03/07/2019  Rate: 72  Rhythm: atrial fibrillation  QRS Axis: normal  Intervals: normal  ST/T Wave abnormalities: normal  Conduction Disutrbances:none  Narrative Interpretation:    Old EKG Reviewed: unchanged  I have personally reviewed the EKG tracing and agree with the computerized printout as noted.   Radiology Dg Chest Portable 1 View  Result Date: 03/06/2019 CLINICAL DATA:  Shortness of breath. EXAM: PORTABLE CHEST 1 VIEW COMPARISON:  Radiograph 01/01/2018 FINDINGS: Cardiomegaly with unchanged mediastinal contours. Vascular congestion. No confluent airspace disease, large pleural effusion or pneumothorax. No acute osseous abnormalities are seen. IMPRESSION: Cardiomegaly with vascular congestion. Electronically Signed   By: Keith Rake M.D.   On: 03/06/2019 00:31    Procedures Procedures (including critical care time)  Medications Ordered in ED Medications - No data to display   Initial Impression / Assessment and Plan / ED Course  I have reviewed the triage vital signs and the nursing notes.  Pertinent labs & imaging results that were available during my care of  the patient were reviewed by me and considered in my medical decision making (see chart for details).       Patient just seen a few hours ago.  Patient seen August 15 but went into August 16 before discharge.  Known history of atrial fibrillation.  They had discussed the situation with cardiology when he was seen before.  D-dimer was negative cardiology just recommended no medications at that time for him to restart his Eliquis.  He had stopped it.  And they recommended he get seen in the office.  Also cardiology did not recommend starting any new medications since since his rate is not fast.  Patient returns basically with concerns for irregular heartbeat again sometimes feeling as if it was going rapid.  But no new symptoms.  Patient wants to hold on taking the Eliquis because he has dental surgery coming up in the next few days.  Patient does have cardiology to follow-up with.  Patient observed heart rate consistent with atrial fibrillation but no rapid ventricular rate.  Oxygen  saturations were good on room air they were mid 90s.  No hypoxia.  No significant chest pain.  Feel patient can follow-up with cardiology we discussed warning signs and what to return for this helped him understand the situation.  Patient told to return for rapid heart rate like around 140 that does not resolve over 40 minutes.  Or any new or worse symptoms.   Final Clinical Impressions(s) / ED Diagnoses   Final diagnoses:  Atrial fibrillation, unspecified type [facility])    ED Discharge Orders    None       Fredia Sorrow, MD 03/07/19 [phone]

## 2019-03-06 NOTE — ED Notes (Signed)
Called [facility] again at 0100 was on hold til 0111 no answer.

## 2019-03-06 NOTE — ED Provider Notes (Signed)
[facility] EMERGENCY DEPARTMENT Provider Note   CSN: [id number] Arrival date & time: 03/05/19  2339     History   Chief Complaint Chief Complaint  Patient presents with  . Atrial Fibrillation    HPI Michael Choi is a 58 y.o. male.     Patient presents with palpitations concerning he is in atrial fibrillation.  Also with shortness of breath.  States he first noticed this on the morning of August 15 where he felt palpitations of late he was in A. fib.  This resolved after about 30 minutes to 1 hour.  Symptoms recurred again tonight about 9 PM with palpitations and shortness of breath has been persistent.  He feels like he cannot breathe when he feels anxious and he feels irregular heartbeat.  He denies any chest pain, cough or fever.  No leg pain or leg swelling.  Does report history of atrial fibrillation in the past requiring cardioversion about 1 year ago as well as ablation.  He is no longer on anticoagulation as it was stopped by his cardiologist.  He denies any abdominal pain, vomiting cough or fever.  He takes testosterone injections and he is wondering whether that could be contributing.  States compliance with his losartan and amlodipine.  He is no longer on metoprolol.  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  He is not confident how long he has been in atrial fibrillation.  The history is provided by the patient.  Atrial Fibrillation Associated symptoms include shortness of breath. Pertinent negatives include no abdominal pain and no headaches.    Past Medical History:  Diagnosis Date  . Atrial flutter ([place])   . Atypical nevus 04/16/2016   left outer back (mild)   . Hypertension   . Melanoma [facility])     Patient Active Problem List   Diagnosis Date Noted  . Atrial flutter ([place]) 11/26/2017  . Hypogonadism male 09/27/2017  .  Secondary erythrocytosis 08/28/2017  . Prediabetes 04/03/2015  . HTN (hypertension) 10/13/2011  . Attention deficit hyperactivity disorder (ADHD) 04/02/2009    Past Surgical History:  Procedure Laterality Date  . A-FLUTTER ABLATION N/A 03/05/2018   Procedure: A-FLUTTER ABLATION;  Surgeon: [name], MD;  Location: [place] CV LAB;  Service: Cardiovascular;  Laterality: N/A;  . CARDIOVERSION N/A 11/27/2017   Procedure: CARDIOVERSION;  Surgeon: [name], MD;  Location: [facility] ENDOSCOPY;  Service: Cardiovascular;  Laterality: N/A;  . KNEE ARTHROSCOPY    . TEE WITHOUT CARDIOVERSION N/A 11/27/2017   Procedure: TRANSESOPHAGEAL ECHOCARDIOGRAM (TEE);  Surgeon: [name], MD;  Location: [facility] ENDOSCOPY;  Service: Cardiovascular;  Laterality: N/A;        Home Medications    Prior to Admission medications   Medication Sig Start Date End Date Taking? Authorizing Provider  amLODipine (NORVASC) 5 MG tablet Take 5 mg by mouth daily. 12/31/16   [provider]  elvitegravir-cobicistat-emtricitabine-tenofovir (GENVOYA) 150-150-200-10 MG TABS tablet Take 1 tablet by mouth daily with breakfast. 07/08/18   Tegeler, Gwenyth Allegra, MD  furosemide (LASIX) 20 MG tablet Take 1 tablet (20 mg total) by mouth daily. For 3 days 06/03/18 09/01/18  [name], MD  losartan (COZAAR) 50 MG tablet Take 50 mg by mouth daily. 01/20/17   [provider]  metoprolol tartrate (LOPRESSOR) 100 MG tablet Take 1 tablet (100 mg total)  two hours prior to cardiac CT test 05/19/18   [name], MD  multivitamin [facility]) per tablet Take 1 tablet by mouth daily.    [provider]  testosterone cypionate (DEPOTESTOSTERONE CYPIONATE) 200 MG/ML injection Inject 1 mL into the muscle every 14 (fourteen) days.  01/07/17   [provider]    Family History Family History  Problem Relation Age of Onset  . Cancer Mother   . Heart disease Father        rheumatic heart  disease s/p transplant  . Hypertension Brother     Social History Social History   Tobacco Use  . Smoking status: Never Smoker  . Smokeless tobacco: Never Used  Substance Use Topics  . Alcohol use: No  . Drug use: No     Allergies   Doxycycline and Tetracycline   Review of Systems Review of Systems  Constitutional: Negative for activity change, appetite change, fatigue and fever.  HENT: Negative for congestion and rhinorrhea.   Eyes: Negative for visual disturbance.  Respiratory: Positive for chest tightness and shortness of breath.   Gastrointestinal: Negative for abdominal pain, nausea and vomiting.  Genitourinary: Negative for dysuria and hematuria.  Musculoskeletal: Negative for arthralgias and myalgias.  Skin: Negative for rash.  Neurological: Negative for dizziness, weakness and headaches.    all other systems are negative except as noted in the HPI and PMH.    Physical Exam Updated Vital Signs BP (!) 147/104   Pulse 65   Temp 97.7 F (36.5 C) (Oral)   Resp 17   Ht 5\' 11"  (1.803 m)   Wt 112 kg   SpO2 94%   BMI 34.45 kg/m   Physical Exam Vitals signs and nursing note reviewed.  Constitutional:      General: He is not in acute distress.    Appearance: He is well-developed. He is obese.     Comments: Appears uncomfortable  HENT:     Head: Normocephalic and atraumatic.     Mouth/Throat:     Pharynx: No oropharyngeal exudate.  Eyes:     Conjunctiva/sclera: Conjunctivae normal.     Pupils: Pupils are equal, round, and reactive to light.  Neck:     Musculoskeletal: Normal range of motion and neck supple.     Comments: No meningismus. Cardiovascular:     Rate and Rhythm: Normal rate. Rhythm irregular.     Heart sounds: Normal heart sounds. No murmur.  Pulmonary:     Effort: Pulmonary effort is normal. No respiratory distress.     Breath sounds: Normal breath sounds.  Abdominal:     Palpations: Abdomen is soft.     Tenderness: There is no abdominal  tenderness. There is no guarding or rebound.  Musculoskeletal: Normal range of motion.        General: No tenderness.  Skin:    General: Skin is warm.     Capillary Refill: Capillary refill takes less than 2 seconds.  Neurological:     General: No focal deficit present.     Mental Status: He is alert and oriented to person, place, and time. Mental status is at baseline.     Cranial Nerves: No cranial nerve deficit.     Motor: No abnormal muscle tone.     Coordination: Coordination normal.     Comments: No ataxia on finger to nose bilaterally. No pronator drift. 5/5 strength throughout. CN 2-12 intact.Equal grip strength. Sensation intact.   Psychiatric:        Behavior:  Behavior normal.      ED Treatments / Results  Labs (all labs ordered are listed, but only abnormal results are displayed) Labs Reviewed  CBC WITH DIFFERENTIAL/PLATELET - Abnormal; Notable for the following components:      Result Value   Hemoglobin 17.6 (*)    HCT 53.0 (*)    Monocytes Absolute 1.1 (*)    All other components within normal limits  COMPREHENSIVE METABOLIC PANEL - Abnormal; Notable for the following components:   Glucose, Bld 100 (*)    All other components within normal limits  BRAIN NATRIURETIC PEPTIDE - Abnormal; Notable for the following components:   B Natriuretic Peptide 128.0 (*)    All other components within normal limits  PROTIME-INR  D-DIMER, QUANTITATIVE (NOT AT [facility])  TROPONIN I (HIGH SENSITIVITY)  TROPONIN I (HIGH SENSITIVITY)    EKG EKG Interpretation  Date/Time:  Saturday March 05 2019 23:52:10 EDT Ventricular Rate:  73 PR Interval:    QRS Duration: 107 QT Interval:  366 QTC Calculation: 404 R Axis:   25 Text Interpretation:  Atrial fibrillation Abnormal R-wave progression, late transition now atrial fibrillation  Confirmed by Ezequiel Essex [phone]) on 03/06/2019 12:07:20 AM   Radiology Dg Chest Portable 1 View  Result Date: 03/06/2019 CLINICAL DATA:  Shortness  of breath. EXAM: PORTABLE CHEST 1 VIEW COMPARISON:  Radiograph 01/01/2018 FINDINGS: Cardiomegaly with unchanged mediastinal contours. Vascular congestion. No confluent airspace disease, large pleural effusion or pneumothorax. No acute osseous abnormalities are seen. IMPRESSION: Cardiomegaly with vascular congestion. Electronically Signed   By: Keith Rake M.D.   On: 03/06/2019 00:31    Procedures Procedures (including critical care time)  Medications Ordered in ED Medications  diltiazem (CARDIZEM) injection 10 mg (has no administration in time range)     Initial Impression / Assessment and Plan / ED Course  I have reviewed the triage vital signs and the nursing notes.  Pertinent labs & imaging results that were available during my care of the patient were reviewed by me and considered in my medical decision making (see chart for details).       Patient with history of atrial fibrillation here with palpitations and shortness of breath.  He is rate controlled atrial fibrillation in the 70s and 80s.  However he is symptomatic.  Consider cardioversion however patient is no longer on anticoagulation and is unclear how long he has been in this rhythm as he states is been intermittent for the past several days.  This patients CHA2DS2-VASc Score and unadjusted Ischemic Stroke Rate (% per year) is equal to 2.2 % stroke rate/year from a score of 2  Above score calculated as 1 point each if present [CHF, HTN, DM, Vascular=MI/PAD/Aortic Plaque, Age if 65-74, or Male] Above score calculated as 2 points each if present [Age > 75, or Stroke/TIA/TE]   Patient has been off of his anticoagulation.  He has been having palpitations intermittently for several days.  It is uncertain exactly when his atrial fibrillation onset.  Cardioversion was considered but seems overly risky at this point and patient agrees.  Patient feels much improved with heart rate improved to the 60s and 70s after Cardizem  through the IV.  He remains in atrial fibrillation.  But he states his shortness of breath and palpitations have resolved. Troponin negative.  D-dimer negative.  Chest x-ray with mild vascular congestion IV Lasix given.  Unclear why the patient's anticoagulation was stopped.  He was told he longer needs it.  D/w Dr. Emilio Aspen of  cardiology.  He agrees patient not a good cardioversion candidate without a clear time of onset.  Patient feels improved with heart rate in the 60s and 70s still in atrial fibrillation.  It is unclear why his anticoagulation was stopped and Dr. Emilio Aspen does recommend restarting this.  Patient apparently had insurance issues in the past.  Patient ambulatory without desaturation.  States he feels much better. Troponin negative x2.  Patient able to ambulate without desaturation.  He is given a dose of Lasix regarding the congestion on his chest x-ray.  EF was 40 to 45% on last echocardiogram.  Advised patient he should restart his anticoagulation.  He is hesitant to do this as he is scheduled for dental procedure in 4 days.  Advised him to call his cardiologist first thing on Monday, August 17. He should call his dentist as well.   Discussed with patient that he could be at risk for having a stroke which is why the anticoagulation is recommended.  Discussed with patient and his it is his decision whether he wishes to start anticoagulation or not but it is recommended.  He appears to have capacity to make medical decisions.  Patient states he feels much better and wishes to go home.  Troponin negative x2.  He denies any chest pain or shortness of breath.  He is able to ambulate without desaturation. He agrees to call his cardiologist on Monday and return to the ED with new or worsening symptoms.  CRITICAL CARE Performed by: Ezequiel Essex Total critical care time: 34 minutes Critical care time was exclusive of separately billable procedures and treating other patients.  Critical care was necessary to treat or prevent imminent or life-threatening deterioration. Critical care was time spent personally by me on the following activities: development of treatment plan with patient and/or surrogate as well as nursing, discussions with consultants, evaluation of patient's response to treatment, examination of patient, obtaining history from patient or surrogate, ordering and performing treatments and interventions, ordering and review of laboratory studies, ordering and review of radiographic studies, pulse oximetry and re-evaluation of patient's condition.   Final Clinical Impressions(s) / ED Diagnoses   Final diagnoses:  Paroxysmal atrial fibrillation [facility])    ED Discharge Orders    None       Ezequiel Essex, MD 03/06/19 [phone]

## 2019-03-09 ENCOUNTER — Telehealth: Payer: Self-pay | Admitting: Physician Assistant

## 2019-03-09 NOTE — Telephone Encounter (Signed)
Pt called to report that he is having a tooth pulled this afternoon with no sedation.he also wanted to let us know that he was recently in the ED with Afib/Flutter with RVR.... he has some SOB and mild dizziness.. he is very concerned about his job and how he is going to get this managed. I advised him that he needs to be seen with his symptoms and his rate between 90-120 that last several days... he has an appt with Tommye Standard PA 03/18/19 but I moved him up to see the AFIB clinic this Friday 03/11/19 at [place]. Pt agreed and thankful for the plan.   Since he is driving to his dental appt.. he asked if someone could call him this afternoon with the AFIB clinic instructions.... code for the gate [phone].

## 2019-03-09 NOTE — Telephone Encounter (Signed)
New message:    Patient calling stating he has a dental appt but his HR is up and would ike to speak with some one about it. Please call patient.

## 2019-03-10 NOTE — Telephone Encounter (Signed)
Spoke with the pt and gave him the instructions for his OV at the AFIB clinic... pt says he is out of work sinvce he feels he has not been able to work with his "health problems"... so he may not be able to pay for an OV at this time... I advised him that I will forward to our Billing dept for assistance and someone can call him to answer his financial questions.

## 2019-03-11 ENCOUNTER — Ambulatory Visit (HOSPITAL_COMMUNITY)
Admission: RE | Admit: 2019-03-11 | Discharge: 2019-03-11 | Disposition: A | Discharge status: Home / Self Care | Payer: Self-pay | Source: Ambulatory Visit | Attending: Physician Assistant | Admitting: Physician Assistant

## 2019-03-11 ENCOUNTER — Encounter (HOSPITAL_COMMUNITY): Payer: Self-pay | Admitting: Physician Assistant

## 2019-03-11 ENCOUNTER — Other Ambulatory Visit: Payer: Self-pay

## 2019-03-11 ENCOUNTER — Telehealth (HOSPITAL_COMMUNITY): Payer: Self-pay | Admitting: Licensed Clinical Social Worker

## 2019-03-11 ENCOUNTER — Other Ambulatory Visit (HOSPITAL_COMMUNITY): Payer: Self-pay | Admitting: *Deleted

## 2019-03-11 VITALS — BP 136/100 | HR 85 | Ht 71.0 in | Wt 246.2 lb

## 2019-03-11 DIAGNOSIS — E669 Obesity, unspecified: Secondary | ICD-10-CM | POA: Insufficient documentation

## 2019-03-11 DIAGNOSIS — G4733 Obstructive sleep apnea (adult) (pediatric): Secondary | ICD-10-CM | POA: Insufficient documentation

## 2019-03-11 DIAGNOSIS — I10 Essential (primary) hypertension: Secondary | ICD-10-CM | POA: Insufficient documentation

## 2019-03-11 DIAGNOSIS — Z6834 Body mass index (BMI) 34.0-34.9, adult: Secondary | ICD-10-CM | POA: Insufficient documentation

## 2019-03-11 DIAGNOSIS — I428 Other cardiomyopathies: Secondary | ICD-10-CM | POA: Insufficient documentation

## 2019-03-11 DIAGNOSIS — I4892 Unspecified atrial flutter: Secondary | ICD-10-CM | POA: Insufficient documentation

## 2019-03-11 DIAGNOSIS — I4819 Other persistent atrial fibrillation: Secondary | ICD-10-CM

## 2019-03-11 DIAGNOSIS — Z79899 Other long term (current) drug therapy: Secondary | ICD-10-CM | POA: Insufficient documentation

## 2019-03-11 DIAGNOSIS — Z85820 Personal history of malignant melanoma of skin: Secondary | ICD-10-CM | POA: Insufficient documentation

## 2019-03-11 DIAGNOSIS — Z8249 Family history of ischemic heart disease and other diseases of the circulatory system: Secondary | ICD-10-CM | POA: Insufficient documentation

## 2019-03-11 DIAGNOSIS — Z881 Allergy status to other antibiotic agents status: Secondary | ICD-10-CM | POA: Insufficient documentation

## 2019-03-11 MED ORDER — METOPROLOL TARTRATE 25 MG PO TABS: 25.0000 mg | ORAL_TABLET | Freq: Two times a day (BID) | ORAL | 3 refills | Status: DC

## 2019-03-11 NOTE — Progress Notes (Signed)
Primary Care Physician: [name], MD Primary Electrophysiologist: Dr [name] Referring Physician: [facility]   Michael Choi is a 58 y.o. male with a history of atrial flutter s/p ablation 03/05/18 with Dr [name], NICM, HTN, OSA, polycythemia and persistent atrial fibrillation who presents for follow up in the [facility]. Patient reports that on 03/05/19 he had palpitations and SOB which lasted about an hour and then again later that night which persisted. He presented to the ER and was found to be in rate controlled afib. He did not undergo DCCV as his anticoagulation was stopped post flutter ablation and the duration of his present episode was unknown. Patient reports that he has been under a lot of stress with being out of work. He remains SOB with exertion and does have periods where he feels his heart is racing.  Today, he denies symptoms of chest pain, orthopnea, PND, lower extremity edema, dizziness, presyncope, syncope, snoring, daytime somnolence, bleeding, or neurologic sequela. The patient is tolerating medications without difficulties and is otherwise without complaint today.    Atrial Fibrillation Risk Factors:  he does have symptoms or diagnosis of sleep apnea.   he has a BMI of Body mass index is 34.34 kg/m.Marland Kitchen Filed Weights   03/11/19 0916  Weight: 111.7 kg    Family History  Problem Relation Age of Onset  . Cancer Mother   . Heart disease Father        rheumatic heart disease s/p transplant  . Hypertension Brother      Atrial Fibrillation Management history:  Previous antiarrhythmic drugs: none Previous cardioversions: 11/2017 Previous ablations: 03/05/18 flutter CHADS2VASC score: 2 Anticoagulation history: Eliquis   Past Medical History:  Diagnosis Date  . A-fib ([place])   . Atrial flutter ([place])   . Atypical nevus 04/16/2016   left outer back (mild)   . Hypertension   . Melanoma [facility])    Past Surgical History:  Procedure  Laterality Date  . A-FLUTTER ABLATION N/A 03/05/2018   Procedure: A-FLUTTER ABLATION;  Surgeon: [name], MD;  Location: [facility];  Service: Cardiovascular;  Laterality: N/A;  . CARDIOVERSION N/A 11/27/2017   Procedure: CARDIOVERSION;  Surgeon: [name], MD;  Location: [facility] ENDOSCOPY;  Service: Cardiovascular;  Laterality: N/A;  . KNEE ARTHROSCOPY    . TEE WITHOUT CARDIOVERSION N/A 11/27/2017   Procedure: TRANSESOPHAGEAL ECHOCARDIOGRAM (TEE);  Surgeon: [name], MD;  Location: [facility] ENDOSCOPY;  Service: Cardiovascular;  Laterality: N/A;    Current Outpatient Medications  Medication Sig Dispense Refill  . amLODipine (NORVASC) 5 MG tablet Take 5 mg by mouth daily.  3  . losartan (COZAAR) 50 MG tablet Take 50 mg by mouth daily.  3  . multivitamin (THERAGRAN) per tablet Take 1 tablet by mouth daily.    Marland Kitchen testosterone cypionate (DEPOTESTOSTERONE CYPIONATE) 200 MG/ML injection Inject 1 mL into the muscle every 14 (fourteen) days.     Marland Kitchen apixaban (ELIQUIS) 5 MG TABS tablet Take 1 tablet (5 mg total) by mouth 2 (two) times daily. (Patient not taking: Reported on 03/11/2019) 60 tablet 0  . elvitegravir-cobicistat-emtricitabine-tenofovir (GENVOYA) 150-150-200-10 MG TABS tablet Take 1 tablet by mouth daily with breakfast. (Patient not taking: Reported on 03/11/2019) 30 tablet 0  . furosemide (LASIX) 20 MG tablet Take 1 tablet (20 mg total) by mouth daily. (Patient not taking: Reported on 03/11/2019) 3 tablet 0  . metoprolol tartrate (LOPRESSOR) 100 MG tablet Take 1 tablet (100 mg total) two  hours prior to cardiac CT test (Patient not taking: Reported on 03/11/2019) 1 tablet 0  . metoprolol tartrate (LOPRESSOR) 25 MG tablet Take 1 tablet (25 mg total) by mouth 2 (two) times daily. 60 tablet 3   No current facility-administered medications for this encounter.     Allergies  Allergen Reactions  . Doxycycline Swelling  . Tetracycline Rash    Social History    Socioeconomic History  . Marital status: Single    Spouse name: Not on file  . Number of children: Not on file  . Years of education: Not on file  . Highest education level: Not on file  Occupational History  . Not on file  Social Needs  . Financial resource strain: Not on file  . Food insecurity    Worry: Not on file    Inability: Not on file  . Transportation needs    Medical: Not on file    Non-medical: Not on file  Tobacco Use  . Smoking status: Never Smoker  . Smokeless tobacco: Never Used  Substance and Sexual Activity  . Alcohol use: No  . Drug use: No  . Sexual activity: Not on file  Lifestyle  . Physical activity    Days per week: Not on file    Minutes per session: Not on file  . Stress: Not on file  Relationships  . Social Herbalist on phone: Not on file    Gets together: Not on file    Attends religious service: Not on file    Active member of club or organization: Not on file    Attends meetings of clubs or organizations: Not on file    Relationship status: Not on file  . Intimate partner violence    Fear of current or ex partner: Not on file    Emotionally abused: Not on file    Physically abused: Not on file    Forced sexual activity: Not on file  Other Topics Concern  . Not on file  Social History Narrative  . Not on file     ROS- All systems are reviewed and negative except as per the HPI above.  Physical Exam: Vitals:   03/11/19 0916  BP: (!) 136/100  Pulse: 85  Weight: 111.7 kg  Height: 5\' 11"  (1.803 m)    GEN- The patient is well appearing, alert and oriented x 3 today.   Head- normocephalic, atraumatic Eyes-  Sclera clear, conjunctiva pink Ears- hearing intact Oropharynx- clear Neck- supple  Lungs- Clear to ausculation bilaterally, normal work of breathing Heart- irregular rate and rhythm, no murmurs, rubs or gallops  GI- soft, NT, ND, + BS Extremities- no clubbing, cyanosis, or edema MS- no significant deformity or  atrophy Skin- no rash or lesion Psych- euthymic mood, full affect Neuro- strength and sensation are intact  Wt Readings from Last 3 Encounters:  03/11/19 111.7 kg  03/06/19 111.1 kg  03/05/19 112 kg    EKG today demonstrates afib HR 85, inc RBBB, QRS 96, QTc 421  Echo 06/16/18 demonstrated  - Left ventricle: The cavity size was moderately to severely   dilated. Wall thickness was normal. Systolic function was mildly   to moderately reduced. The estimated ejection fraction was in the   range of 40% to 45%. Diffuse hypokinesis. Although no diagnostic   regional wall motion abnormality was identified, this possibility   cannot be completely excluded on the basis of this study.   Features are consistent with a  pseudonormal left ventricular   filling pattern, with concomitant abnormal relaxation and   increased filling pressure (grade 2 diastolic dysfunction). - Aortic valve: Transvalvular velocity was within the normal range.   There was no stenosis. There was trivial regurgitation. Mean   gradient (S): 5 mm Hg. Valve area (VTI): 2.05 cm^2. - Mitral valve: Transvalvular velocity was within the normal range.   There was no evidence for stenosis. There was trivial   regurgitation. - Left atrium: The atrium was mildly dilated. - Right ventricle: The cavity size was mildly dilated. Wall   thickness was normal. Systolic function was normal. - Right atrium: The atrium was mildly dilated. - Tricuspid valve: There was trivial regurgitation. - Pulmonary arteries: Systolic pressure could not be accurately   estimated. - Inferior vena cava: The vessel was normal in size. The   respirophasic diameter changes were in the normal range (>= 50%),   consistent with normal central venous pressure. - Pericardium, extracardiac: There was no pericardial effusion.  Impressions:  - Compared to the TEE 11/27/2017, the rhythm is now sinus with   ectopy. Limited images available for side by side  comparison.   Epic records are reviewed at length today  Assessment and Plan:  1. Persistent atrial fibrillation/atrial flutter S/p flutter ablation 03/05/18. General education about afib discussed and questions answered.  Patient symptomatic with SOB and heart racing. We discussed therapeutic options including TEE/DCCV. He has not been able to start anticoagulation 2/2 cost.  Will give him samples of Eliquis for at least 5 doses prior to TEE/DCCV and then for one month after. Will then have him establish with CC to transition to warfarin. He is eager to have DCCV done so he can get back to work. Will start metoprolol 25 mg BID  This patients CHA2DS2-VASc Score and unadjusted Ischemic Stroke Rate (% per year) is equal to 2.2 % stroke rate/year from a score of 2  Above score calculated as 1 point each if present [CHF, HTN, DM, Vascular=MI/PAD/Aortic Plaque, Age if 65-74, or Male] Above score calculated as 2 points each if present [Age > 75, or Stroke/TIA/TE]   2. Obesity Body mass index is 34.34 kg/m. Lifestyle modification was discussed at length including regular exercise and weight reduction.  3. Obstructive sleep apnea The importance of adequate treatment of sleep apnea was discussed today in order to improve our ability to maintain sinus rhythm long term.   4. HTN Elevated today, start metoprolol as above.  5. Nonischemic Cardiomyopathy EF 40-45% by last echo. No signs or symptoms of fluid overload. No CAD on CTA. BB as above.   Follow up on week post DCCV. Will also have social work reach out to him regarding financial strain.   [facility] [address] [place], [place] [address] [phone] 03/11/2019 11:05 AM

## 2019-03-11 NOTE — Telephone Encounter (Signed)
CSW consulted to help patient with insurance concerns.  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  Pt is hopeful that he can get this procedure and then move forward with continuing to work.  CSW discussed Medicaid but it doesn't appear that patient would meet requirements due to high level of assets.  CSW then discussed [facility] and sent patient an application. CSW explained that this might be a possibility for him if he meets their requirements.  If pt qualifies he would receive help with previous cone bills up to [id number] back from application as well as future bills for 6 months.  CSW will continue to follow and assist as needed  Jorge Ny, [facility] Desk#: [phone] Cell#: [phone]

## 2019-03-11 NOTE — Patient Instructions (Addendum)
Cardioversion scheduled for Friday, August 28th  - Arrive at the [facility] and go to admitting at [facility] not eat or drink anything after midnight the night prior to your procedure.  - Take all your morning medication with a sip of water prior to arrival.  - You will not be able to drive home after your procedure.   Do not miss any doses of Eliquis.  Start metoprolol 25mg  twice a day

## 2019-03-15 ENCOUNTER — Other Ambulatory Visit: Payer: Self-pay

## 2019-03-15 ENCOUNTER — Other Ambulatory Visit (HOSPITAL_COMMUNITY)
Admission: RE | Admit: 2019-03-15 | Discharge: 2019-03-15 | Disposition: A | Discharge status: Home / Self Care | Payer: HRSA Program | Source: Ambulatory Visit | Attending: Cardiology | Admitting: Cardiology

## 2019-03-15 DIAGNOSIS — Z01812 Encounter for preprocedural laboratory examination: Secondary | ICD-10-CM | POA: Insufficient documentation

## 2019-03-15 DIAGNOSIS — Z20828 Contact with and (suspected) exposure to other viral communicable diseases: Secondary | ICD-10-CM | POA: Insufficient documentation

## 2019-03-15 LAB — SARS CORONAVIRUS 2 (TAT 6-24 HRS): SARS Coronavirus 2: NEGATIVE

## 2019-03-17 NOTE — Progress Notes (Signed)
Spoke with patient, able to quarantine without symptoms since covid 19 testing, aware NPO p MN, has not missed any of his Eliquis doses, aware to take morning of procedure.

## 2019-03-18 ENCOUNTER — Other Ambulatory Visit: Payer: Self-pay

## 2019-03-18 ENCOUNTER — Ambulatory Visit (HOSPITAL_COMMUNITY): Payer: Self-pay | Admitting: Anesthesiology

## 2019-03-18 ENCOUNTER — Ambulatory Visit: Payer: Self-pay | Admitting: Physician Assistant

## 2019-03-18 ENCOUNTER — Ambulatory Visit (HOSPITAL_COMMUNITY)
Admission: RE | Admit: 2019-03-18 | Discharge: 2019-03-18 | Disposition: A | Discharge status: Home / Self Care | Payer: Self-pay | Attending: Cardiology | Admitting: Cardiology

## 2019-03-18 ENCOUNTER — Encounter (HOSPITAL_COMMUNITY): Payer: Self-pay | Admitting: Cardiology

## 2019-03-18 ENCOUNTER — Ambulatory Visit (HOSPITAL_COMMUNITY)
Admission: RE | Admit: 2019-03-18 | Discharge: 2019-03-18 | Disposition: A | Discharge status: Home / Self Care | Payer: Self-pay | Source: Home / Self Care | Attending: Physician Assistant | Admitting: Physician Assistant

## 2019-03-18 ENCOUNTER — Encounter (HOSPITAL_COMMUNITY)
Admission: RE | Disposition: A | Discharge status: Home / Self Care | Payer: Self-pay | Source: Home / Self Care | Attending: Cardiology

## 2019-03-18 DIAGNOSIS — I420 Dilated cardiomyopathy: Secondary | ICD-10-CM | POA: Insufficient documentation

## 2019-03-18 DIAGNOSIS — I4819 Other persistent atrial fibrillation: Secondary | ICD-10-CM

## 2019-03-18 DIAGNOSIS — I428 Other cardiomyopathies: Secondary | ICD-10-CM | POA: Insufficient documentation

## 2019-03-18 DIAGNOSIS — I4891 Unspecified atrial fibrillation: Secondary | ICD-10-CM | POA: Insufficient documentation

## 2019-03-18 DIAGNOSIS — Z6834 Body mass index (BMI) 34.0-34.9, adult: Secondary | ICD-10-CM | POA: Insufficient documentation

## 2019-03-18 DIAGNOSIS — Z8249 Family history of ischemic heart disease and other diseases of the circulatory system: Secondary | ICD-10-CM | POA: Insufficient documentation

## 2019-03-18 DIAGNOSIS — Z7902 Long term (current) use of antithrombotics/antiplatelets: Secondary | ICD-10-CM | POA: Insufficient documentation

## 2019-03-18 DIAGNOSIS — G4733 Obstructive sleep apnea (adult) (pediatric): Secondary | ICD-10-CM | POA: Insufficient documentation

## 2019-03-18 DIAGNOSIS — I4892 Unspecified atrial flutter: Secondary | ICD-10-CM | POA: Insufficient documentation

## 2019-03-18 DIAGNOSIS — I700 Atherosclerosis of aorta: Secondary | ICD-10-CM | POA: Insufficient documentation

## 2019-03-18 DIAGNOSIS — E669 Obesity, unspecified: Secondary | ICD-10-CM | POA: Insufficient documentation

## 2019-03-18 DIAGNOSIS — I351 Nonrheumatic aortic (valve) insufficiency: Secondary | ICD-10-CM | POA: Insufficient documentation

## 2019-03-18 DIAGNOSIS — Z79899 Other long term (current) drug therapy: Secondary | ICD-10-CM | POA: Insufficient documentation

## 2019-03-18 DIAGNOSIS — I10 Essential (primary) hypertension: Secondary | ICD-10-CM | POA: Insufficient documentation

## 2019-03-18 DIAGNOSIS — Z881 Allergy status to other antibiotic agents status: Secondary | ICD-10-CM | POA: Insufficient documentation

## 2019-03-18 HISTORY — PX: TEE WITHOUT CARDIOVERSION: SHX5443

## 2019-03-18 HISTORY — PX: CARDIOVERSION: SHX1299

## 2019-03-18 SURGERY — ECHOCARDIOGRAM, TRANSESOPHAGEAL
Anesthesia: General

## 2019-03-18 MED ORDER — MIDAZOLAM HCL 2 MG/2ML IJ SOLN
INTRAMUSCULAR | Status: DC | PRN
  Administered 2019-03-18: 2 mg via INTRAVENOUS

## 2019-03-18 MED ORDER — BUTAMBEN-TETRACAINE-BENZOCAINE 2-2-14 % EX AERO
INHALATION_SPRAY | CUTANEOUS | Status: DC | PRN
  Administered 2019-03-18: 2 via TOPICAL

## 2019-03-18 MED ORDER — SODIUM CHLORIDE 0.9 % IV SOLN
INTRAVENOUS | Status: DC
  Administered 2019-03-18: 11:00:00 via INTRAVENOUS

## 2019-03-18 MED ORDER — APIXABAN 5 MG PO TABS
5.0000 mg | ORAL_TABLET | Freq: Once | ORAL | Status: AC
  Administered 2019-03-18: 11:00:00 5 mg via ORAL
  Filled 2019-03-18: qty 1

## 2019-03-18 MED ORDER — FENTANYL CITRATE (PF) 100 MCG/2ML IJ SOLN
INTRAMUSCULAR | Status: DC | PRN
  Administered 2019-03-18: 100 ug via INTRAVENOUS

## 2019-03-18 MED ORDER — PROPOFOL 10 MG/ML IV BOLUS
INTRAVENOUS | Status: DC | PRN
  Administered 2019-03-18: 20 mg via INTRAVENOUS
  Administered 2019-03-18: 15 mg via INTRAVENOUS

## 2019-03-18 MED ORDER — PROPOFOL 500 MG/50ML IV EMUL
INTRAVENOUS | Status: DC | PRN
  Administered 2019-03-18: 150 ug/kg/min via INTRAVENOUS

## 2019-03-18 NOTE — Anesthesia Procedure Notes (Signed)
Procedure Name: General with mask airway Date/Time: 03/18/2019 11:35 AM Performed by: Elayne Snare, CRNA Pre-anesthesia Checklist: Patient identified, Emergency Drugs available, Suction available and Patient being monitored Patient Re-evaluated:Patient Re-evaluated prior to induction Oxygen Delivery Method: Nasal cannula

## 2019-03-18 NOTE — Discharge Instructions (Signed)
TEE ° °YOU HAD AN CARDIAC PROCEDURE TODAY: Refer to the procedure report and other information in the discharge instructions given to you for any specific questions about what was found during the examination. If this information does not answer your questions, please call [facility] office at [phone] to clarify.  ° °DIET: Your first meal following the procedure should be a light meal and then it is ok to progress to your normal diet. A half-sandwich or bowl of soup is an example of a good first meal. Heavy or fried foods are harder to digest and may make you feel nauseous or bloated. Drink plenty of fluids but you should avoid alcoholic beverages for 24 hours. If you had a esophageal dilation, please see attached instructions for diet.  ° °ACTIVITY: Your care partner should take you home directly after the procedure. You should plan to take it easy, moving slowly for the rest of the day. You can resume normal activity the day after the procedure however YOU SHOULD NOT DRIVE, use power tools, machinery or perform tasks that involve climbing or major physical exertion for 24 hours (because of the sedation medicines used during the test).  ° °SYMPTOMS TO REPORT IMMEDIATELY: °A cardiologist can be reached at any hour. Please call [phone] for any of the following symptoms:  °Vomiting of blood or coffee ground material  °New, significant abdominal pain  °New, significant chest pain or pain under the shoulder blades  °Painful or persistently difficult swallowing  °New shortness of breath  °Black, tarry-looking or red, bloody stools ° °FOLLOW UP:  °Please also call with any specific questions about appointments or follow up tests. ° °TEE ° °YOU HAD AN CARDIAC PROCEDURE TODAY: Refer to the procedure report and other information in the discharge instructions given to you for any specific questions about what was found during the examination. If this information does not answer your questions, please call [facility] office at [phone] to clarify.  ° °DIET: Your first meal following the procedure should be a light meal and then it is ok to progress to your normal diet. A half-sandwich or bowl of soup is an example of a good first meal. Heavy or fried foods are harder to digest and may make you feel nauseous or bloated. Drink plenty of fluids but you should avoid alcoholic beverages for 24 hours. If you had a esophageal dilation, please see attached instructions for diet.  ° °ACTIVITY: Your care partner should take you home directly after the procedure. You should plan to take it easy, moving slowly for the rest of the day. You can resume normal activity the day after the procedure however YOU SHOULD NOT DRIVE, use power tools, machinery or perform tasks that involve climbing or major physical exertion for 24 hours (because of the sedation medicines used during the test).  ° °SYMPTOMS TO REPORT IMMEDIATELY: °A cardiologist can be reached at any hour. Please call [phone] for any of the following symptoms:  °Vomiting of blood or coffee ground material  °New, significant abdominal pain  °New, significant chest pain or pain under the shoulder blades  °Painful or persistently difficult swallowing  °New shortness of breath  °Black, tarry-looking or red, bloody stools ° °FOLLOW UP:  °Please also call with any specific questions about appointments or follow up tests. ° ° °

## 2019-03-18 NOTE — Anesthesia Preprocedure Evaluation (Signed)
Anesthesia Evaluation  Patient identified by MRN, date of birth, ID band Patient awake    Reviewed: Allergy & Precautions, NPO status , Patient's Chart, lab work & pertinent test results  Airway Mallampati: III  TM Distance: >3 FB Neck ROM: Full    Dental no notable dental hx. (+) Teeth Intact, Implants,    Pulmonary sleep apnea ,    Pulmonary exam normal breath sounds clear to auscultation       Cardiovascular hypertension, Pt. on medications +CHF  Normal cardiovascular exam Rhythm:Regular Rate:Normal  ECG: ST, rate 151, occ PVC's  ECHO:  Left ventricle: The estimated ejection fraction was in the range of 30% to 35%. Recommend follow up transthoracic echocardiogram in sinus rhythm for accurate LVEF evaluation. No evidence of thrombus. Left atrium: No evidence of thrombus in the atrial cavity or appendage. No evidence of thrombus in the atrial cavity or appendage.    Neuro/Psych PSYCHIATRIC DISORDERS negative neurological ROS     GI/Hepatic negative GI ROS, Neg liver ROS,   Endo/Other  negative endocrine ROS  Renal/GU negative Renal ROS     Musculoskeletal negative musculoskeletal ROS (+)   Abdominal (+) + obese,   Peds  Hematology Testosterone induced elevated H and H   Anesthesia Other Findings A-flutter  Reproductive/Obstetrics                             Anesthesia Physical  Anesthesia Plan  ASA: III  Anesthesia Plan: General   Post-op Pain Management:    Induction: Intravenous  PONV Risk Score and Plan: 2 and Ondansetron, Dexamethasone, Midazolam and Treatment may vary due to age or medical condition  Airway Management Planned: Simple Face Mask and Mask  Additional Equipment:   Intra-op Plan:   Post-operative Plan: Extubation in OR  Informed Consent: I have reviewed the patients History and Physical, chart, labs and discussed the procedure including the risks,  benefits and alternatives for the proposed anesthesia with the patient or authorized representative who has indicated his/her understanding and acceptance.     Dental advisory given  Plan Discussed with: CRNA, Anesthesiologist and Surgeon  Anesthesia Plan Comments:         Anesthesia Quick Evaluation

## 2019-03-18 NOTE — Progress Notes (Signed)
Transesophageal Echocardiogram Note  Michael Choi [id number] 11-20-1960  Procedure: Transesophageal Echocardiogram Indications: Atrial fibrillation  Procedure Details Consent: Obtained Time Out: Verified patient identification, verified procedure, site/side was marked, verified correct patient position, special equipment/implants available, Radiology Safety Procedures followed,  medications/allergies/relevent history reviewed, required imaging and test results available.  Performed  Medications:  Pt sedated by anesthesia with versed 2 mg, fentanyl 100 micrograms and diprovan 170 mg IV.  Mild global reduction in LV systolic function; no LAA thrombus; mild AI.  Pt subsequently had DCCV with 120 J to NSR. No immediate complications; continue apixaban.   Complications: No apparent complications Patient did tolerate procedure well.  Olga Millers, MD

## 2019-03-18 NOTE — Anesthesia Postprocedure Evaluation (Signed)
Anesthesia Post Note  Patient: Michael Choi  Procedure(s) Performed: TRANSESOPHAGEAL ECHOCARDIOGRAM (TEE) (N/A ) CARDIOVERSION (N/A )     Patient location during evaluation: PACU Anesthesia Type: General Level of consciousness: awake and alert Pain management: pain level controlled Vital Signs Assessment: post-procedure vital signs reviewed and stable Respiratory status: spontaneous breathing, nonlabored ventilation, respiratory function stable and patient connected to nasal cannula oxygen Cardiovascular status: blood pressure returned to baseline and stable Postop Assessment: no apparent nausea or vomiting Anesthetic complications: no    Last Vitals:  Vitals:   03/18/19 1215 03/18/19 1225  BP: 110/87 117/90  Pulse: 74 74  Resp: 13 15  Temp:    SpO2: 93% 94%    Last Pain:  Vitals:   03/18/19 1155  TempSrc: Temporal  PainSc: 0-No pain                 Kristopher Attwood

## 2019-03-18 NOTE — Transfer of Care (Signed)
Immediate Anesthesia Transfer of Care Note  Patient: EDIZ NEYLON  Procedure(s) Performed: TRANSESOPHAGEAL ECHOCARDIOGRAM (TEE) (N/A ) CARDIOVERSION (N/A )  Patient Location: Endoscopy Unit  Anesthesia Type:General  Level of Consciousness: drowsy and patient cooperative  Airway & Oxygen Therapy: Patient Spontanous Breathing  Post-op Assessment: Report given to RN and Post -op Vital signs reviewed and stable  Post vital signs: Reviewed and stable  Last Vitals:  Vitals Value Taken Time  BP 128/82 03/18/19 1155  Temp    Pulse 79 03/18/19 1155  Resp 14 03/18/19 1155  SpO2 95 % 03/18/19 1155  Vitals shown include unvalidated device data.  Last Pain:  Vitals:   03/18/19 1155  TempSrc: Temporal  PainSc: 0-No pain         Complications: No apparent anesthesia complications

## 2019-03-18 NOTE — H&P (Signed)
Oliver Barre, PA  Physician Assistant  Electrophysiology  Progress Notes    Signed  Date of Service:  03/11/2019 9:00 AM      Related encounter: ATRIAL FIB OFFICE VISIT from 03/11/2019 in [facility]      Signed         Show:Clear all [x] Manual[x] Template[x] Copied  Added by: [x] Fenton, Doloris Hall, PA  [] Hover for details    Primary Care Physician: [name], MD Primary Electrophysiologist: Dr [name] Referring Physician: Forestine Na ER   Michael Choi is a 58 y.o. male with a history of atrial flutter s/p ablation 03/05/18 with Dr [name], NICM, HTN, OSA, polycythemia and persistent atrial fibrillation who presents for follow up in the [facility]. Patient reports that on 03/05/19 he had palpitations and SOB which lasted about an hour and then again later that night which persisted. He presented to the ER and was found to be in rate controlled afib. He did not undergo DCCV as his anticoagulation was stopped post flutter ablation and the duration of his present episode was unknown. Patient reports that he has been under a lot of stress with being out of work. He remains SOB with exertion and does have periods where he feels his heart is racing.  Today, he denies symptoms of chest pain, orthopnea, PND, lower extremity edema, dizziness, presyncope, syncope, snoring, daytime somnolence, bleeding, or neurologic sequela. The patient is tolerating medications without difficulties and is otherwise without complaint today.    Atrial Fibrillation Risk Factors:  he does have symptoms or diagnosis of sleep apnea.   he has a BMI of Body mass index is 34.34 kg/m.Marland Kitchen    Filed Weights   03/11/19 0916  Weight: 111.7 kg         Family History  Problem Relation Age of Onset  . Cancer Mother   . Heart disease Father        rheumatic heart disease s/p transplant  . Hypertension Brother      Atrial Fibrillation  Management history:  Previous antiarrhythmic drugs: none Previous cardioversions: 11/2017 Previous ablations: 03/05/18 flutter CHADS2VASC score: 2 Anticoagulation history: Eliquis       Past Medical History:  Diagnosis Date  . A-fib ([place])   . Atrial flutter ([place])   . Atypical nevus 04/16/2016   left outer back (mild)   . Hypertension   . Melanoma [facility])         Past Surgical History:  Procedure Laterality Date  . A-FLUTTER ABLATION N/A 03/05/2018   Procedure: A-FLUTTER ABLATION;  Surgeon: [name], MD;  Location: [place] CV LAB;  Service: Cardiovascular;  Laterality: N/A;  . CARDIOVERSION N/A 11/27/2017   Procedure: CARDIOVERSION;  Surgeon: [name], MD;  Location: [facility] ENDOSCOPY;  Service: Cardiovascular;  Laterality: N/A;  . KNEE ARTHROSCOPY    . TEE WITHOUT CARDIOVERSION N/A 11/27/2017   Procedure: TRANSESOPHAGEAL ECHOCARDIOGRAM (TEE);  Surgeon: [name], MD;  Location: [facility] ENDOSCOPY;  Service: Cardiovascular;  Laterality: N/A;          Current Outpatient Medications  Medication Sig Dispense Refill  . amLODipine (NORVASC) 5 MG tablet Take 5 mg by mouth daily.  3  . losartan (COZAAR) 50 MG tablet Take 50 mg by mouth daily.  3  . multivitamin (THERAGRAN) per tablet Take 1 tablet by mouth daily.    Marland Kitchen testosterone cypionate (DEPOTESTOSTERONE CYPIONATE) 200 MG/ML injection Inject 1 mL into the muscle every 14 (fourteen) days.     Marland Kitchen  apixaban (ELIQUIS) 5 MG TABS tablet Take 1 tablet (5 mg total) by mouth 2 (two) times daily. (Patient not taking: Reported on 03/11/2019) 60 tablet 0  . elvitegravir-cobicistat-emtricitabine-tenofovir (GENVOYA) 150-150-200-10 MG TABS tablet Take 1 tablet by mouth daily with breakfast. (Patient not taking: Reported on 03/11/2019) 30 tablet 0  . furosemide (LASIX) 20 MG tablet Take 1 tablet (20 mg total) by mouth daily. (Patient not taking: Reported on 03/11/2019) 3 tablet 0  . metoprolol tartrate (LOPRESSOR)  100 MG tablet Take 1 tablet (100 mg total) two hours prior to cardiac CT test (Patient not taking: Reported on 03/11/2019) 1 tablet 0  . metoprolol tartrate (LOPRESSOR) 25 MG tablet Take 1 tablet (25 mg total) by mouth 2 (two) times daily. 60 tablet 3   No current facility-administered medications for this encounter.         Allergies  Allergen Reactions  . Doxycycline Swelling  . Tetracycline Rash    Social History        Socioeconomic History  . Marital status: Single    Spouse name: Not on file  . Number of children: Not on file  . Years of education: Not on file  . Highest education level: Not on file  Occupational History  . Not on file  Social Needs  . Financial resource strain: Not on file  . Food insecurity    Worry: Not on file    Inability: Not on file  . Transportation needs    Medical: Not on file    Non-medical: Not on file  Tobacco Use  . Smoking status: Never Smoker  . Smokeless tobacco: Never Used  Substance and Sexual Activity  . Alcohol use: No  . Drug use: No  . Sexual activity: Not on file  Lifestyle  . Physical activity    Days per week: Not on file    Minutes per session: Not on file  . Stress: Not on file  Relationships  . Social Herbalist on phone: Not on file    Gets together: Not on file    Attends religious service: Not on file    Active member of club or organization: Not on file    Attends meetings of clubs or organizations: Not on file    Relationship status: Not on file  . Intimate partner violence    Fear of current or ex partner: Not on file    Emotionally abused: Not on file    Physically abused: Not on file    Forced sexual activity: Not on file  Other Topics Concern  . Not on file  Social History Narrative  . Not on file     ROS- All systems are reviewed and negative except as per the HPI above.  Physical Exam:    Vitals:   03/11/19 0916  BP: (!) 136/100   Pulse: 85  Weight: 111.7 kg  Height: 5\' 11"  (1.803 m)    GEN- The patient is well appearing, alert and oriented x 3 today.   Head- normocephalic, atraumatic Eyes-  Sclera clear, conjunctiva pink Ears- hearing intact Oropharynx- clear Neck- supple  Lungs- Clear to ausculation bilaterally, normal work of breathing Heart- irregular rate and rhythm, no murmurs, rubs or gallops  GI- soft, NT, ND, + BS Extremities- no clubbing, cyanosis, or edema MS- no significant deformity or atrophy Skin- no rash or lesion Psych- euthymic mood, full affect Neuro- strength and sensation are intact     Wt Readings from Last 3  Encounters:  03/11/19 111.7 kg  03/06/19 111.1 kg  03/05/19 112 kg    EKG today demonstrates afib HR 85, inc RBBB, QRS 96, QTc 421  Echo 06/16/18 demonstrated  - Left ventricle: The cavity size was moderately to severely dilated. Wall thickness was normal. Systolic function was mildly to moderately reduced. The estimated ejection fraction was in the range of 40% to 45%. Diffuse hypokinesis. Although no diagnostic regional wall motion abnormality was identified, this possibility cannot be completely excluded on the basis of this study. Features are consistent with a pseudonormal left ventricular filling pattern, with concomitant abnormal relaxation and increased filling pressure (grade 2 diastolic dysfunction). - Aortic valve: Transvalvular velocity was within the normal range. There was no stenosis. There was trivial regurgitation. Mean gradient (S): 5 mm Hg. Valve area (VTI): 2.05 cm^2. - Mitral valve: Transvalvular velocity was within the normal range. There was no evidence for stenosis. There was trivial regurgitation. - Left atrium: The atrium was mildly dilated. - Right ventricle: The cavity size was mildly dilated. Wall thickness was normal. Systolic function was normal. - Right atrium: The atrium was mildly dilated. - Tricuspid  valve: There was trivial regurgitation. - Pulmonary arteries: Systolic pressure could not be accurately estimated. - Inferior vena cava: The vessel was normal in size. The respirophasic diameter changes were in the normal range (>= 50%), consistent with normal central venous pressure. - Pericardium, extracardiac: There was no pericardial effusion.  Impressions:  - Compared to the TEE 11/27/2017, the rhythm is now sinus with ectopy. Limited images available for side by side comparison.   Epic records are reviewed at length today  Assessment and Plan:  1. Persistent atrial fibrillation/atrial flutter S/p flutter ablation 03/05/18. General education about afib discussed and questions answered.  Patient symptomatic with SOB and heart racing. We discussed therapeutic options including TEE/DCCV. He has not been able to start anticoagulation 2/2 cost.  Will give him samples of Eliquis for at least 5 doses prior to TEE/DCCV and then for one month after. Will then have him establish with CC to transition to warfarin. He is eager to have DCCV done so he can get back to work. Will start metoprolol 25 mg BID  This patients CHA2DS2-VASc Score and unadjusted Ischemic Stroke Rate (% per year) is equal to 2.2 % stroke rate/year from a score of 2  Above score calculated as 1 point each if present [CHF, HTN, DM, Vascular=MI/PAD/Aortic Plaque, Age if 65-74, or Male] Above score calculated as 2 points each if present [Age > 75, or Stroke/TIA/TE]   2. Obesity Body mass index is 34.34 kg/m. Lifestyle modification was discussed at length including regular exercise and weight reduction.  3. Obstructive sleep apnea The importance of adequate treatment of sleep apnea was discussed today in order to improve our ability to maintain sinus rhythm long term.   4. HTN Elevated today, start metoprolol as above.  5. Nonischemic Cardiomyopathy EF 40-45% by last echo. No signs or  symptoms of fluid overload. No CAD on CTA. BB as above.   Follow up on week post DCCV. Will also have social work reach out to him regarding financial strain.   [facility] [place], [place] [address] [phone] 03/11/2019 11:05 AM         For TEE/DCCV; no changes Kirk Ruths

## 2019-03-18 NOTE — Progress Notes (Signed)
  Echocardiogram Echocardiogram Transesophageal has been performed.  Michael Choi 03/18/2019, 12:10 PM

## 2019-03-18 NOTE — Interval H&P Note (Signed)
History and Physical Interval Note:  03/18/2019 10:04 AM  Michael Choi  has presented today for surgery, with the diagnosis of AFIB.  The various methods of treatment have been discussed with the patient and family. After consideration of risks, benefits and other options for treatment, the patient has consented to  Procedure(s): TRANSESOPHAGEAL ECHOCARDIOGRAM (TEE) (N/A) CARDIOVERSION (N/A) as a surgical intervention.  The patient's history has been reviewed, patient examined, no change in status, stable for surgery.  I have reviewed the patient's chart and labs.  Questions were answered to the patient's satisfaction.     Kirk Ruths

## 2019-03-18 NOTE — Progress Notes (Signed)
Pt HR dipped into the 30-40s three times.  Pt asymptomatic.  MD notified.  Will continue to monitor.

## 2019-03-20 ENCOUNTER — Encounter (HOSPITAL_COMMUNITY): Payer: Self-pay | Admitting: Cardiology

## 2019-03-21 ENCOUNTER — Telehealth: Payer: Self-pay | Admitting: Internal Medicine

## 2019-03-21 NOTE — Telephone Encounter (Signed)
Patient calling in to speak with nurse regarding EKG and work issues

## 2019-03-21 NOTE — Telephone Encounter (Signed)
I attempted to call the patient. °No answer- I left a message to please call back.  °

## 2019-03-21 NOTE — Telephone Encounter (Signed)
I spoke with the patient. 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 He had an atrial flutter ablation on 03/05/18. He was most recently seen by the [facility] on 03/11/19 and noted to have been in the ER on 03/05/19 with A-fib.  He did not receive a DCCV at his ER visit as he had been off anticoagulation post flutter ablation.  He was started back on anticoagulation on 03/11/19. He had a TEE/ DCCV on 03/18/19.  He calls today stating Dr. [name] had filled out paperwork for him about 6 months ago for work. 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  He states he has not a current DOT physical as he is needing a letter from Dr. [name] stating that he is ok to work due to his heart rhythm.  I advised I will review with Dr. [name] to see what he is able to write for him as the actual DOT clearance will have to come from whomever does his physical.  He states he is just needing something to state he can work. If Dr. [name] feels he can't work, then he states he won't even pursue it.   I again advised I will review with Dr. [name] to see what he can write and we will call him back.  He would like to pick up a letter at the [facility] office.

## 2019-03-24 ENCOUNTER — Ambulatory Visit (HOSPITAL_COMMUNITY)
Admission: RE | Admit: 2019-03-24 | Discharge: 2019-03-24 | Disposition: A | Discharge status: Home / Self Care | Payer: Self-pay | Source: Ambulatory Visit | Attending: Physician Assistant | Admitting: Physician Assistant

## 2019-03-24 ENCOUNTER — Other Ambulatory Visit: Payer: Self-pay

## 2019-03-24 ENCOUNTER — Encounter (HOSPITAL_COMMUNITY): Payer: Self-pay | Admitting: Physician Assistant

## 2019-03-24 VITALS — BP 140/88 | HR 56 | Ht 71.0 in | Wt 248.0 lb

## 2019-03-24 DIAGNOSIS — E669 Obesity, unspecified: Secondary | ICD-10-CM | POA: Insufficient documentation

## 2019-03-24 DIAGNOSIS — G4733 Obstructive sleep apnea (adult) (pediatric): Secondary | ICD-10-CM | POA: Insufficient documentation

## 2019-03-24 DIAGNOSIS — I4892 Unspecified atrial flutter: Secondary | ICD-10-CM | POA: Insufficient documentation

## 2019-03-24 DIAGNOSIS — I4819 Other persistent atrial fibrillation: Secondary | ICD-10-CM | POA: Insufficient documentation

## 2019-03-24 DIAGNOSIS — I10 Essential (primary) hypertension: Secondary | ICD-10-CM | POA: Insufficient documentation

## 2019-03-24 DIAGNOSIS — Z6834 Body mass index (BMI) 34.0-34.9, adult: Secondary | ICD-10-CM | POA: Insufficient documentation

## 2019-03-24 DIAGNOSIS — Z881 Allergy status to other antibiotic agents status: Secondary | ICD-10-CM | POA: Insufficient documentation

## 2019-03-24 DIAGNOSIS — Z8249 Family history of ischemic heart disease and other diseases of the circulatory system: Secondary | ICD-10-CM | POA: Insufficient documentation

## 2019-03-24 DIAGNOSIS — Z85820 Personal history of malignant melanoma of skin: Secondary | ICD-10-CM | POA: Insufficient documentation

## 2019-03-24 DIAGNOSIS — Z9889 Other specified postprocedural states: Secondary | ICD-10-CM | POA: Insufficient documentation

## 2019-03-24 DIAGNOSIS — Z79899 Other long term (current) drug therapy: Secondary | ICD-10-CM | POA: Insufficient documentation

## 2019-03-24 DIAGNOSIS — Z7901 Long term (current) use of anticoagulants: Secondary | ICD-10-CM | POA: Insufficient documentation

## 2019-03-24 DIAGNOSIS — D751 Secondary polycythemia: Secondary | ICD-10-CM | POA: Insufficient documentation

## 2019-03-24 DIAGNOSIS — I428 Other cardiomyopathies: Secondary | ICD-10-CM | POA: Insufficient documentation

## 2019-03-24 NOTE — Progress Notes (Signed)
Primary Care Physician: [name], MD Primary Electrophysiologist: Dr [name] Referring Physician: Forestine Na ER   Michael Choi is a 58 y.o. male with a history of atrial flutter s/p ablation 03/05/18 with Dr [name], NICM, HTN, OSA, polycythemia and persistent atrial fibrillation who presents for follow up in the [facility]. Patient reports that on 03/05/19 he had palpitations and SOB which lasted about an hour and then again later that night which persisted. He presented to the ER and was found to be in rate controlled afib. He did not undergo DCCV as his anticoagulation was stopped post flutter ablation and the duration of his present episode was unknown. Patient reports that he has been under a lot of stress with being out of work.   On follow up today, patient is s/p TEE/DCCV on 03/18/19. ECG post cardioversion showed NSR. Unfortunately, patient has had ERAF. He states that he felt no different being in SR or in afib. He does report that the metoprolol has helped his symptoms tremendously. Of note, he hopes to be starting a new job in the next few weeks.  Today, he denies symptoms of chest pain, orthopnea, PND, lower extremity edema, dizziness, presyncope, syncope, snoring, daytime somnolence, bleeding, or neurologic sequela. The patient is tolerating medications without difficulties and is otherwise without complaint today.    Atrial Fibrillation Risk Factors:  he does have symptoms or diagnosis of sleep apnea. Diagnosed remotely. He is not on CPAP. He denies any alcohol use. He denies history of rheumatic fever.   he has a BMI of Body mass index is 34.59 kg/m.Marland Kitchen Filed Weights   03/24/19 0935  Weight: 112.5 kg    Family History  Problem Relation Age of Onset  . Cancer Mother   . Heart disease Father        rheumatic heart disease s/p transplant  . Hypertension Brother      Atrial Fibrillation Management history:  Previous antiarrhythmic drugs:  none Previous cardioversions: 11/2017, 03/18/19 Previous ablations: 03/05/18 flutter CHADS2VASC score: 3 (CHF, HTN, aortic plaque) Anticoagulation history: Eliquis   Past Medical History:  Diagnosis Date  . A-fib ([place])   . Atrial flutter ([place])   . Atypical nevus 04/16/2016   left outer back (mild)   . Hypertension   . Melanoma [facility])    Past Surgical History:  Procedure Laterality Date  . A-FLUTTER ABLATION N/A 03/05/2018   Procedure: A-FLUTTER ABLATION;  Surgeon: [name], MD;  Location: [place] CV LAB;  Service: Cardiovascular;  Laterality: N/A;  . CARDIOVERSION N/A 11/27/2017   Procedure: CARDIOVERSION;  Surgeon: [name], MD;  Location: [place] ENDOSCOPY;  Service: Cardiovascular;  Laterality: N/A;  . CARDIOVERSION N/A 03/18/2019   Procedure: CARDIOVERSION;  Surgeon: [name], MD;  Location: [facility] ENDOSCOPY;  Service: Cardiovascular;  Laterality: N/A;  . KNEE ARTHROSCOPY    . TEE WITHOUT CARDIOVERSION N/A 11/27/2017   Procedure: TRANSESOPHAGEAL ECHOCARDIOGRAM (TEE);  Surgeon: [name], MD;  Location: [facility] ENDOSCOPY;  Service: Cardiovascular;  Laterality: N/A;  . TEE WITHOUT CARDIOVERSION N/A 03/18/2019   Procedure: TRANSESOPHAGEAL ECHOCARDIOGRAM (TEE);  Surgeon: [name], MD;  Location: [facility] ENDOSCOPY;  Service: Cardiovascular;  Laterality: N/A;    Current Outpatient Medications  Medication Sig Dispense Refill  . amLODipine (NORVASC) 5 MG tablet Take 5 mg by mouth daily.  3  . apixaban (ELIQUIS) 5 MG TABS tablet Take 1 tablet (5 mg total) by mouth 2 (two) times daily. 60 tablet 0  .  losartan (COZAAR) 50 MG tablet Take 50 mg by mouth daily.  3  . metoprolol tartrate (LOPRESSOR) 25 MG tablet Take 1 tablet (25 mg total) by mouth 2 (two) times daily. 60 tablet 3  . multivitamin (THERAGRAN) per tablet Take 1 tablet by mouth daily.    Marland Kitchen testosterone cypionate (DEPOTESTOSTERONE CYPIONATE) 200 MG/ML injection Inject 1 mL into the muscle every 14  (fourteen) days.      No current facility-administered medications for this encounter.     Allergies  Allergen Reactions  . Doxycycline Swelling  . Tetracycline Rash    Social History   Socioeconomic History  . Marital status: Single    Spouse name: Not on file  . Number of children: Not on file  . Years of education: Not on file  . Highest education level: Not on file  Occupational History  . Not on file  Social Needs  . Financial resource strain: Not on file  . Food insecurity    Worry: Not on file    Inability: Not on file  . Transportation needs    Medical: Not on file    Non-medical: Not on file  Tobacco Use  . Smoking status: Never Smoker  . Smokeless tobacco: Never Used  Substance and Sexual Activity  . Alcohol use: No  . Drug use: No  . Sexual activity: Not on file  Lifestyle  . Physical activity    Days per week: Not on file    Minutes per session: Not on file  . Stress: Not on file  Relationships  . Social Herbalist on phone: Not on file    Gets together: Not on file    Attends religious service: Not on file    Active member of club or organization: Not on file    Attends meetings of clubs or organizations: Not on file    Relationship status: Not on file  . Intimate partner violence    Fear of current or ex partner: Not on file    Emotionally abused: Not on file    Physically abused: Not on file    Forced sexual activity: Not on file  Other Topics Concern  . Not on file  Social History Narrative  . Not on file     ROS- All systems are reviewed and negative except as per the HPI above.  Physical Exam: Vitals:   03/24/19 0935  BP: 140/88  Pulse: (!) 56  Weight: 112.5 kg  Height: 5\' 11"  (1.803 m)    GEN- The patient is well appearing obese male, alert and oriented x 3 today.   HEENT-head normocephalic, atraumatic, sclera clear, conjunctiva pink, hearing intact, trachea midline. Lungs- Clear to ausculation bilaterally, normal  work of breathing Heart- irregular rate and rhythm, no murmurs, rubs or gallops  GI- soft, NT, ND, + BS Extremities- no clubbing, cyanosis, or edema MS- no significant deformity or atrophy Skin- no rash or lesion Psych- euthymic mood, full affect Neuro- strength and sensation are intact   Wt Readings from Last 3 Encounters:  03/24/19 112.5 kg  03/18/19 107.5 kg  03/11/19 111.7 kg    EKG today demonstrates afib HR 56, QRS 90, QTc 366  Echo 06/16/18 demonstrated  - Left ventricle: The cavity size was moderately to severely   dilated. Wall thickness was normal. Systolic function was mildly   to moderately reduced. The estimated ejection fraction was in the   range of 40% to 45%. Diffuse hypokinesis. Although no diagnostic  regional wall motion abnormality was identified, this possibility   cannot be completely excluded on the basis of this study.   Features are consistent with a pseudonormal left ventricular   filling pattern, with concomitant abnormal relaxation and   increased filling pressure (grade 2 diastolic dysfunction). - Aortic valve: Transvalvular velocity was within the normal range.   There was no stenosis. There was trivial regurgitation. Mean   gradient (S): 5 mm Hg. Valve area (VTI): 2.05 cm^2. - Mitral valve: Transvalvular velocity was within the normal range.   There was no evidence for stenosis. There was trivial   regurgitation. - Left atrium: The atrium was mildly dilated. - Right ventricle: The cavity size was mildly dilated. Wall   thickness was normal. Systolic function was normal. - Right atrium: The atrium was mildly dilated. - Tricuspid valve: There was trivial regurgitation. - Pulmonary arteries: Systolic pressure could not be accurately   estimated. - Inferior vena cava: The vessel was normal in size. The   respirophasic diameter changes were in the normal range (>= 50%),   consistent with normal central venous pressure. - Pericardium,  extracardiac: There was no pericardial effusion.  Impressions:  - Compared to the TEE 11/27/2017, the rhythm is now sinus with   ectopy. Limited images available for side by side comparison.   Epic records are reviewed at length today  Assessment and Plan:  1. Persistent atrial fibrillation/atrial flutter S/p flutter ablation 03/05/18. Now s/p TEE/DCCV Unfortunately, he has has ERAF. Patient did have symptomatic improvement with addition of BB. He did not feel much different in SR. We discussed therapeutic options today. Given patient is now minimally symptomatic and rate controlled, will hold off on rhythm strategy for now. Would like to try to get him back in SR eventually given young age and NICM. Continue Eliquis 5 mg BID for now. Pt assistance form filled out. If denied, will have him establish with CC to transition to warfarin. Continue metoprolol 25 mg BID  This patients CHA2DS2-VASc Score and unadjusted Ischemic Stroke Rate (% per year) is equal to 3.2 % stroke rate/year from a score of 3  Above score calculated as 1 point each if present [CHF, HTN, DM, Vascular=MI/PAD/Aortic Plaque, Age if 65-74, or Male] Above score calculated as 2 points each if present [Age > 75, or Stroke/TIA/TE]   2. Obesity Body mass index is 34.59 kg/m. Lifestyle modification was discussed and encouraged including regular physical activity and weight reduction.  3. Obstructive sleep apnea The importance of adequate treatment of sleep apnea was discussed today in order to improve our ability to maintain sinus rhythm long term. Mild per pt report. He is not currently treated. May need to consider repeat sleep study.  4. HTN Stable, no changes today.  5. Nonischemic Cardiomyopathy EF 40-45% by last echo. No signs or symptoms of fluid overload. No CAD on CTA. BB as above.   Follow up in the AF clinic in one month.   [facility] [address]  [place], [place] [address] [phone] 03/24/2019 11:25 AM

## 2019-03-27 NOTE — Telephone Encounter (Signed)
Called and spoke with patient  A fair amount of ongoing stress- on metoprolol and feeling better

## 2019-04-04 NOTE — Telephone Encounter (Signed)
I think not [employer]

## 2019-04-07 ENCOUNTER — Encounter (HOSPITAL_COMMUNITY): Payer: Self-pay

## 2019-04-07 ENCOUNTER — Telehealth (HOSPITAL_COMMUNITY): Payer: Self-pay | Admitting: *Deleted

## 2019-04-07 NOTE — Telephone Encounter (Signed)
Patient approved for eliquis assistance through [id number] Application [id number]

## 2019-04-25 ENCOUNTER — Encounter (HOSPITAL_COMMUNITY): Payer: Self-pay | Admitting: *Deleted

## 2019-04-25 ENCOUNTER — Ambulatory Visit (HOSPITAL_COMMUNITY): Payer: Self-pay | Admitting: Physician Assistant

## 2019-07-01 ENCOUNTER — Other Ambulatory Visit (HOSPITAL_COMMUNITY): Payer: Self-pay

## 2019-07-01 MED ORDER — METOPROLOL TARTRATE 25 MG PO TABS: 25.0000 mg | ORAL_TABLET | Freq: Two times a day (BID) | ORAL | 0 refills | Status: DC

## 2019-07-30 ENCOUNTER — Other Ambulatory Visit (HOSPITAL_COMMUNITY): Payer: Self-pay | Admitting: Physician Assistant

## 2019-08-11 ENCOUNTER — Telehealth: Payer: Self-pay

## 2019-08-24 ENCOUNTER — Other Ambulatory Visit (HOSPITAL_COMMUNITY): Payer: Self-pay | Admitting: Physician Assistant

## 2019-09-01 ENCOUNTER — Other Ambulatory Visit (HOSPITAL_COMMUNITY): Payer: Self-pay | Admitting: Physician Assistant

## 2019-09-05 ENCOUNTER — Telehealth (HOSPITAL_COMMUNITY): Payer: Self-pay | Admitting: Physician Assistant

## 2019-09-05 ENCOUNTER — Other Ambulatory Visit (HOSPITAL_COMMUNITY): Payer: Self-pay

## 2019-09-05 MED ORDER — METOPROLOL TARTRATE 25 MG PO TABS: ORAL_TABLET | ORAL | 0 refills | Status: DC

## 2019-09-05 NOTE — Telephone Encounter (Signed)
Called patient to schedule appt d/t metoprolol refill request.  Left message for patient to call back.  We have sent 30 day refill in per Rushie Goltz, RN but will not be able to authorize any further refills after that without pt being seen by Adline Peals, PA.

## 2019-09-07 ENCOUNTER — Other Ambulatory Visit (HOSPITAL_COMMUNITY): Payer: Self-pay | Admitting: *Deleted

## 2019-09-07 MED ORDER — METOPROLOL TARTRATE 25 MG PO TABS: ORAL_TABLET | ORAL | 2 refills | Status: DC

## 2019-09-07 NOTE — Telephone Encounter (Signed)
Pt returned my call.  Pt states he still does not have health ins and would like to wait, if possible, to come in.  I explained that we have not seen him since 03/2019 and in order to refill his meds, we do need to see him on a regular basis.  Spoke with Adline Peals, PA and he is agreeable to pt waiting 2 months to come for appt and a refill of pt's meds for 60 days will be sent in.  Pt was agreeable to appt 10/27/19 with Ricky.  Prescription refill given to Rushie Goltz, RN to send in for pt.

## 2019-10-05 ENCOUNTER — Emergency Department (HOSPITAL_COMMUNITY): Payer: Self-pay

## 2019-10-05 ENCOUNTER — Other Ambulatory Visit: Payer: Self-pay

## 2019-10-05 ENCOUNTER — Encounter (HOSPITAL_COMMUNITY): Payer: Self-pay | Admitting: Emergency Medicine

## 2019-10-05 ENCOUNTER — Emergency Department (HOSPITAL_COMMUNITY)
Admission: EM | Admit: 2019-10-05 | Discharge: 2019-10-05 | Disposition: A | Discharge status: Home / Self Care | Payer: Self-pay | Attending: Emergency Medicine | Admitting: Emergency Medicine

## 2019-10-05 DIAGNOSIS — R200 Anesthesia of skin: Secondary | ICD-10-CM

## 2019-10-05 DIAGNOSIS — Z7901 Long term (current) use of anticoagulants: Secondary | ICD-10-CM | POA: Insufficient documentation

## 2019-10-05 DIAGNOSIS — R202 Paresthesia of skin: Secondary | ICD-10-CM | POA: Insufficient documentation

## 2019-10-05 DIAGNOSIS — Z85828 Personal history of other malignant neoplasm of skin: Secondary | ICD-10-CM | POA: Insufficient documentation

## 2019-10-05 DIAGNOSIS — Z79899 Other long term (current) drug therapy: Secondary | ICD-10-CM | POA: Insufficient documentation

## 2019-10-05 DIAGNOSIS — Z20822 Contact with and (suspected) exposure to covid-19: Secondary | ICD-10-CM | POA: Insufficient documentation

## 2019-10-05 DIAGNOSIS — I10 Essential (primary) hypertension: Secondary | ICD-10-CM | POA: Insufficient documentation

## 2019-10-05 LAB — DIFFERENTIAL
Abs Immature Granulocytes: 0.02 10*3/uL (ref 0.00–0.07)
Basophils Absolute: 0 10*3/uL (ref 0.0–0.1)
Basophils Relative: 0 %
Eosinophils Absolute: 0.1 10*3/uL (ref 0.0–0.5)
Eosinophils Relative: 1 %
Immature Granulocytes: 0 %
Lymphocytes Relative: 26 %
Lymphs Abs: 2 10*3/uL (ref 0.7–4.0)
Monocytes Absolute: 1 10*3/uL (ref 0.1–1.0)
Monocytes Relative: 12 %
Neutro Abs: 4.8 10*3/uL (ref 1.7–7.7)
Neutrophils Relative %: 61 %

## 2019-10-05 LAB — COMPREHENSIVE METABOLIC PANEL
ALT: 72 U/L — ABNORMAL HIGH (ref 0–44)
AST: 49 U/L — ABNORMAL HIGH (ref 15–41)
Albumin: 3.7 g/dL (ref 3.5–5.0)
Alkaline Phosphatase: 44 U/L (ref 38–126)
Anion gap: 11 (ref 5–15)
BUN: 17 mg/dL (ref 6–20)
CO2: 20 mmol/L — ABNORMAL LOW (ref 22–32)
Calcium: 9.3 mg/dL (ref 8.9–10.3)
Chloride: 104 mmol/L (ref 98–111)
Creatinine, Ser: 1.02 mg/dL (ref 0.61–1.24)
GFR calc Af Amer: >60 mL/min (ref >60)
GFR calc non Af Amer: >60 mL/min (ref >60)
Glucose, Bld: 93 mg/dL (ref 70–99)
Potassium: 5.2 mmol/L — ABNORMAL HIGH (ref 3.5–5.1)
Sodium: 135 mmol/L (ref 135–145)
Total Bilirubin: 0.9 mg/dL (ref 0.3–1.2)
Total Protein: 7 g/dL (ref 6.5–8.1)

## 2019-10-05 LAB — CBC
HCT: 54.2 % — ABNORMAL HIGH (ref 39.0–52.0)
Hemoglobin: 18.4 g/dL — ABNORMAL HIGH (ref 13.0–17.0)
MCH: 31.9 pg (ref 26.0–34.0)
MCHC: 33.9 g/dL (ref 30.0–36.0)
MCV: 94.1 fL (ref 80.0–100.0)
Platelets: 222 10*3/uL (ref 150–400)
RBC: 5.76 MIL/uL (ref 4.22–5.81)
RDW: 14.6 % (ref 11.5–15.5)
WBC: 7.9 10*3/uL (ref 4.0–10.5)
nRBC: 0 % (ref 0.0–0.2)

## 2019-10-05 LAB — PROTIME-INR
INR: 1.1 (ref 0.8–1.2)
Prothrombin Time: 14.2 seconds (ref 11.4–15.2)

## 2019-10-05 LAB — RESPIRATORY PANEL BY RT PCR (FLU A&B, COVID)
Influenza A by PCR: NEGATIVE
Influenza B by PCR: NEGATIVE
SARS Coronavirus 2 by RT PCR: NEGATIVE

## 2019-10-05 LAB — LDL CHOLESTEROL, DIRECT: Direct LDL: 132.3 mg/dL — ABNORMAL HIGH (ref 0–99)

## 2019-10-05 LAB — APTT: aPTT: 34 seconds (ref 24–36)

## 2019-10-05 MED ORDER — IOHEXOL 350 MG/ML SOLN
75.0000 mL | Freq: Once | INTRAVENOUS | Status: AC | PRN
  Administered 2019-10-05: 75 mL via INTRAVENOUS

## 2019-10-05 MED ORDER — SODIUM CHLORIDE 0.9% FLUSH: 3.0000 mL | Freq: Once | INTRAVENOUS | Status: DC

## 2019-10-05 MED ORDER — ATORVASTATIN CALCIUM 20 MG PO TABS: 20.0000 mg | ORAL_TABLET | Freq: Every day | ORAL | 1 refills | Status: DC

## 2019-10-05 NOTE — Discharge Instructions (Addendum)
Follow-up with your primary care doctor.  Start taking the cholesterol medication.

## 2019-10-05 NOTE — ED Notes (Signed)
All appropriate discharge materials reviewed at length with patient. Time for questions provided. Pt has no other questions at this time and verbalizes understanding of all provided materials.  

## 2019-10-05 NOTE — ED Triage Notes (Signed)
C/o L sided facial numbness since Saturday that he states is expanding.  Reports intermittent dizziness that was worse on Saturday. States he had a misstep on Monday and hit top of head.   C/o headache.  Denies LOC.   No arm drift.  Afib- taking Eliquis.

## 2019-10-05 NOTE — Consult Note (Signed)
Neurology Consultation  Reason for Consult: Left-sided decreased sensation and weakness Referring Physician: Francia Greaves  CC: Left-sided decreased sensation  History is obtained from: Patient  HPI: Michael Choi is a 59 y.o. male with past medical history of melanoma, hypertension, atrial flutter, atrial fibrillation who is on chronic Eliquis.  Currently in A. fib.  Patient initially had left facial numbness on Saturday 10/01/2019 but did not come to the hospital till today 10/05/2019 due to the numbness expanding from the face down the arm.  Neurology was consulted for the possibility of stroke.  Patient currently is in room he is comfortable and states that he went to sleep at approximately 8:00 on Friday night feeling normal.  He awoke and noted that the left side of his face felt numb along with a weird sensation in his head that he has a hard time describing.  He also noted that when he got up to ambulate he felt off balance but did not feel as though he was listing to one side or the other.  At that time he did go to [facility] urgent care who had recommended that he does go to the hospital however he did not.  Over Sunday, Monday, Tuesday he noted that the decrease sensation actually spread from his face to his arm.  He called his PCP who at that time again stated that he should go to the hospital.  Is for that reason the patient came to the hospital.  He continues to have numbness in his left face and arm.  He states that he initially did have a slight headache.    He is not sure but he thinks he may have missed 1 dose of Eliquis but again he states he may also have taken it.  Patient states that he does not smoke or do illicit drugs.  ED course  Relevant labs include -AST 49, ALT 72  LKW: 09/30/2019 at approximately 8:00 tpa given?: no, out of window Premorbid modified Rankin scale (mRS): Zero NIH stroke score:1   Past Medical History:  Diagnosis Date  . A-fib ([place])   . Atrial flutter  ([place])   . Atypical nevus 04/16/2016   left outer back (mild)   . Hypertension   . Melanoma ([place])     Family History  Problem Relation Age of Onset  . Cancer Mother   . Heart disease Father        rheumatic heart disease s/p transplant  . Hypertension Brother    Social History:   reports that he has never smoked. He has never used smokeless tobacco. He reports that he does not drink alcohol or use drugs.  Medications  Current Facility-Administered Medications:  .  sodium chloride flush (NS) 0.9 % injection 3 mL, 3 mL, Intravenous, Once, Messick, Wallis Bamberg, MD  Current Outpatient Medications:  .  amLODipine (NORVASC) 5 MG tablet, Take 5 mg by mouth daily., Disp: , Rfl: 3 .  apixaban (ELIQUIS) 5 MG TABS tablet, Take 1 tablet (5 mg total) by mouth 2 (two) times daily., Disp: 60 tablet, Rfl: 0 .  atorvastatin (LIPITOR) 20 MG tablet, Take 1 tablet (20 mg total) by mouth daily., Disp: 30 tablet, Rfl: 1 .  losartan (COZAAR) 50 MG tablet, Take 50 mg by mouth daily., Disp: , Rfl: 3 .  metoprolol tartrate (LOPRESSOR) 25 MG tablet, TAKE 1 TABLET BY MOUTH 2 TIMES DAILY.keep appt for refills (Patient taking differently: Take 25 mg by mouth 2 (two) times daily. ), Disp:  60 tablet, Rfl: 2 .  multivitamin (THERAGRAN) per tablet, Take 1 tablet by mouth daily., Disp: , Rfl:  .  testosterone cypionate (DEPOTESTOSTERONE CYPIONATE) 200 MG/ML injection, Inject 1 mL into the muscle every 14 (fourteen) days. , Disp: , Rfl:   ROS:     General ROS: Positive for -  fatigue Psychological ROS: negative for - behavioral disorder, hallucinations, memory difficulties, mood swings or suicidal ideation Ophthalmic ROS: negative for - blurry vision, double vision, eye pain or loss of vision ENT ROS: negative for - epistaxis, nasal discharge, oral lesions, sore throat, tinnitus or vertigo Allergy and Immunology ROS: negative for - hives or itchy/watery eyes Hematological and Lymphatic ROS: negative for - bleeding  problems, bruising or swollen lymph nodes Endocrine ROS: negative for - galactorrhea, hair pattern changes, polydipsia/polyuria or temperature intolerance Respiratory ROS: negative for - cough, hemoptysis, shortness of breath or wheezing Cardiovascular ROS: negative for - chest pain, dyspnea on exertion, edema or irregular heartbeat Gastrointestinal ROS: negative for - abdominal pain, diarrhea, hematemesis, nausea/vomiting or stool incontinence Genito-Urinary ROS: negative for - dysuria, hematuria, incontinence or urinary frequency/urgency Musculoskeletal ROS: negative for - joint swelling or muscular weakness Neurological ROS: as noted in HPI Dermatological ROS: negative for rash and skin lesion changes  Exam: Current vital signs: BP (!) 129/93 (BP Location: Right Arm)   Pulse 77   Temp 98.1 F (36.7 C) (Oral)   Resp 18   Ht 5\' 11"  (1.803 m)   Wt 102.1 kg   SpO2 96%   BMI 31.38 kg/m  Vital signs in last 24 hours: Temp:  [98.1 F (36.7 C)] 98.1 F (36.7 C) (03/17 1041) Pulse Rate:  [77] 77 (03/17 1041) Resp:  [18] 18 (03/17 1041) BP: (129)/(93) 129/93 (03/17 1041) SpO2:  [96 %] 96 % (03/17 1041) Weight:  [102.1 kg] 102.1 kg (03/17 1047)   Constitutional: Appears well-developed and well-nourished.  Psych: Affect appropriate to situation Eyes: No scleral injection HENT: No OP obstrucion Head: Normocephalic.  Cardiovascular: Normal rate and regular rhythm.  Respiratory: Effort normal, non-labored breathing GI: Soft.  No distension. There is no tenderness.  Skin: WDI Neuro: Mental Status: Patient is awake, alert, oriented to person, place, month, year, and situation. Speech-intact for naming, repeating, comprehension Patient is able to give a clear and coherent history. Cranial Nerves: II: Visual Fields are full.  III,IV, VI: EOMI without ptosis or diploplia. Pupils equal, round and reactive to light V: Facial sensation decreased on the left side but preserved in the  forehead VII: Facial movement is symmetric.  VIII: hearing is intact to voice X: Palat elevates symmetrically XI: Shoulder shrug is symmetric. XII: tongue is midline without atrophy or fasciculations.  Motor: Tone is normal. Bulk is normal. 5/5 strength was present on the right upper and lower extremity with 4/5 on the left upper and lower extremity No drift however he does have positive for beating Sensory: Sensation is decreased on his left arm and leg DSS intact Deep Tendon Reflexes: 2+ and symmetric in the biceps and patellae.  Plantars: Toes are downgoing bilaterally.  Cerebellar: FNF and HKS are intact bilaterally  Labs I have reviewed labs in epic and the results pertinent to this consultation are:   CBC    Component Value Date/Time   WBC 7.9 10/05/2019 1128   RBC 5.76 10/05/2019 1128   HGB 18.4 (H) 10/05/2019 1128   HGB 18.1 (H) 05/19/2018 0927   HCT 54.2 (H) 10/05/2019 1128   HCT 53.0 (H) 05/19/2018 [id number]  PLT 222 10/05/2019 1128   PLT 291 05/19/2018 0927   MCV 94.1 10/05/2019 1128   MCV 92 05/19/2018 0927   MCH 31.9 10/05/2019 1128   MCHC 33.9 10/05/2019 1128   RDW 14.6 10/05/2019 1128   RDW 13.7 05/19/2018 0927   LYMPHSABS 2.0 10/05/2019 1128   LYMPHSABS 2.4 03/02/2018 1449   MONOABS 1.0 10/05/2019 1128   EOSABS 0.1 10/05/2019 1128   EOSABS 0.1 03/02/2018 1449   BASOSABS 0.0 10/05/2019 1128   BASOSABS 0.1 03/02/2018 1449    CMP     Component Value Date/Time   NA 135 10/05/2019 1128   NA 142 03/02/2018 1449   K 5.2 (H) 10/05/2019 1128   CL 104 10/05/2019 1128   CO2 20 (L) 10/05/2019 1128   GLUCOSE 93 10/05/2019 1128   BUN 17 10/05/2019 1128   BUN 15 03/02/2018 1449   CREATININE 1.02 10/05/2019 1128   CREATININE 1.34 (H) 08/07/2017 1211   CALCIUM 9.3 10/05/2019 1128   PROT 7.0 10/05/2019 1128   ALBUMIN 3.7 10/05/2019 1128   AST 49 (H) 10/05/2019 1128   AST 38 (H) 08/07/2017 1211   ALT 72 (H) 10/05/2019 1128   ALT 51 08/07/2017 1211    ALKPHOS 44 10/05/2019 1128   BILITOT 0.9 10/05/2019 1128   BILITOT 0.8 08/07/2017 1211   GFRNONAA >60 10/05/2019 1128   GFRNONAA 57 (L) 08/07/2017 1211   GFRAA >60 10/05/2019 1128   GFRAA >60 08/07/2017 1211    Lipid Panel     Component Value Date/Time   CHOL 158 05/19/2018 0927   TRIG 71 05/19/2018 0927   HDL 44 05/19/2018 0927   CHOLHDL 3.6 05/19/2018 0927   CHOLHDL 3.4 11/27/2017 1036   VLDL 13 11/27/2017 1036   LDLCALC 100 (H) 05/19/2018 0927     Imaging I have reviewed the images obtained:  MRI examination of the brain-pending  Etta Quill PA-C [facility] [phone]  M-F  (9:00 am- 5:00 PM)  10/05/2019, 3:55 PM   I have seen the patient and reviewed the above note.  He has numbness on the ulnar aspect of his left arm, around the cheek on the left face and lateral aspect of his left lower leg.  This distribution does localize to the thalamus.  MRI does not demonstrate acute infarct.  Assessment:  59 year old male with numbness of the face arm and leg which was abrupt in onset in the setting of atrial fibrillation.  The symptoms localized to a very small area and I think that it is possible that this does represent MRI negative stroke.  In any case, he has no functional deficits at the current time, so no need for inpatient therapy evaluation.  I discussed admission with the patient, however he is very hesitant.  Given that he is already anticoagulated and multiple days out from his presumed stroke, the only modifications to secondary stroke prevention in this time would be checking for diabetes and cholesterol.  He has had multiple negative random glucoses.  I think that it is reasonable for him to start on a statin  1) CTA head and neck 2) Start statin therapy, send direct LDL 3) f/u with outpatient PCP and neurology.   Roland Rack, MD [facility] [phone]  If 7pm- 7am, please page neurology on call as listed in  [place].

## 2019-10-05 NOTE — ED Provider Notes (Addendum)
[facility] EMERGENCY DEPARTMENT Provider Note   CSN: [id number] Arrival date & time: 10/05/19  1035     History Chief Complaint  Patient presents with  . facial numbness    Michael Choi is a 59 y.o. male.  59 year old male with prior medical history as detailed below presents for evaluation of facial numbness.  Patient reports that he awoke with symptoms on Saturday morning.  His symptoms have been persistent since.  He describes left facial numbness.  He also is reporting left arm and hand numbness as well.  He also reports mild numbness to the lateral aspect of the left leg.  He reports intermittent weakness of the left leg.  He denies visual change, speech change, other focal complaint, or other complaint.  He denies prior history of stroke. He reports full compliance with his Eliquis.   The history is provided by the patient and medical records.  Illness Location:  Left facial numbness  Severity:  Mild Onset quality:  Gradual Duration:  5 days Timing:  Constant Progression:  Unchanged Chronicity:  New Associated symptoms: no chest pain        Past Medical History:  Diagnosis Date  . A-fib ([place])   . Atrial flutter ([place])   . Atypical nevus 04/16/2016   left outer back (mild)   . Hypertension   . Melanoma [facility])     Patient Active Problem List   Diagnosis Date Noted  . Atrial flutter ([place]) 11/26/2017  . Hypogonadism male 09/27/2017  . Secondary erythrocytosis 08/28/2017  . Prediabetes 04/03/2015  . HTN (hypertension) 10/13/2011  . Attention deficit hyperactivity disorder (ADHD) 04/02/2009    Past Surgical History:  Procedure Laterality Date  . A-FLUTTER ABLATION N/A 03/05/2018   Procedure: A-FLUTTER ABLATION;  Surgeon: [name], MD;  Location: [facility] CV LAB;  Service: Cardiovascular;  Laterality: N/A;  . CARDIOVERSION N/A 11/27/2017   Procedure: CARDIOVERSION;  Surgeon: [name], MD;  Location: [facility] ENDOSCOPY;   Service: Cardiovascular;  Laterality: N/A;  . CARDIOVERSION N/A 03/18/2019   Procedure: CARDIOVERSION;  Surgeon: [name], MD;  Location: [facility] ENDOSCOPY;  Service: Cardiovascular;  Laterality: N/A;  . KNEE ARTHROSCOPY    . TEE WITHOUT CARDIOVERSION N/A 11/27/2017   Procedure: TRANSESOPHAGEAL ECHOCARDIOGRAM (TEE);  Surgeon: [name], MD;  Location: [facility] ENDOSCOPY;  Service: Cardiovascular;  Laterality: N/A;  . TEE WITHOUT CARDIOVERSION N/A 03/18/2019   Procedure: TRANSESOPHAGEAL ECHOCARDIOGRAM (TEE);  Surgeon: [name], MD;  Location: [facility] ENDOSCOPY;  Service: Cardiovascular;  Laterality: N/A;       Family History  Problem Relation Age of Onset  . Cancer Mother   . Heart disease Father        rheumatic heart disease s/p transplant  . Hypertension Brother     Social History   Tobacco Use  . Smoking status: Never Smoker  . Smokeless tobacco: Never Used  Substance Use Topics  . Alcohol use: No  . Drug use: No    Home Medications Prior to Admission medications   Medication Sig Start Date End Date Taking? Authorizing Provider  amLODipine (NORVASC) 5 MG tablet Take 5 mg by mouth daily. 12/31/16   [provider]  apixaban (ELIQUIS) 5 MG TABS tablet Take 1 tablet (5 mg total) by mouth 2 (two) times daily. 03/06/19 04/05/19  Rancour, Annie Main, MD  losartan (COZAAR) 50 MG tablet Take 50 mg by mouth daily. 01/20/17   [provider]  metoprolol tartrate (LOPRESSOR) 25 MG tablet  TAKE 1 TABLET BY MOUTH 2 TIMES DAILY.keep appt for refills 09/07/19   Fenton, Clint R, PA  multivitamin [facility]) per tablet Take 1 tablet by mouth daily.    [provider]  testosterone cypionate (DEPOTESTOSTERONE CYPIONATE) 200 MG/ML injection Inject 1 mL into the muscle every 14 (fourteen) days.  01/07/17   [provider]    Allergies    Doxycycline and Tetracycline  Review of Systems   Review of Systems  Cardiovascular: Negative for chest pain.  All  other systems reviewed and are negative.   Physical Exam Updated Vital Signs BP (!) 129/93 (BP Location: Right Arm)   Pulse 77   Temp 98.1 F (36.7 C) (Oral)   Resp 18   Ht 5\' 11"  (1.803 m)   Wt 102.1 kg   SpO2 96%   BMI 31.38 kg/m   Physical Exam Vitals and nursing note reviewed.  Constitutional:      General: He is not in acute distress.    Appearance: Normal appearance. He is well-developed.  HENT:     Head: Normocephalic and atraumatic.  Eyes:     Conjunctiva/sclera: Conjunctivae normal.     Pupils: Pupils are equal, round, and reactive to light.  Cardiovascular:     Rate and Rhythm: Normal rate and regular rhythm.     Heart sounds: Normal heart sounds.  Pulmonary:     Effort: Pulmonary effort is normal. No respiratory distress.     Breath sounds: Normal breath sounds.  Abdominal:     General: There is no distension.     Palpations: Abdomen is soft.     Tenderness: There is no abdominal tenderness.  Musculoskeletal:        General: No deformity. Normal range of motion.     Cervical back: Normal range of motion and neck supple.  Skin:    General: Skin is warm and dry.  Neurological:     Mental Status: He is alert and oriented to person, place, and time.     Comments: Reported paresthesia and numbness to the left face, left hand, and left lateral lower extremity.  Normal speech  No facial droop  VAN Negative  5 out of 5 strength in both upper and both lower extremities        ED Results / Procedures / Treatments   Labs (all labs ordered are listed, but only abnormal results are displayed) Labs Reviewed  CBC - Abnormal; Notable for the following components:      Result Value   Hemoglobin 18.4 (*)    HCT 54.2 (*)    All other components within normal limits  RESPIRATORY PANEL BY RT PCR (FLU A&B, COVID)  DIFFERENTIAL  PROTIME-INR  APTT  COMPREHENSIVE METABOLIC PANEL    EKG EKG Interpretation  Date/Time:  Wednesday October 05 2019 11:04:26  EDT Ventricular Rate:  83 PR Interval:    QRS Duration: 88 QT Interval:  346 QTC Calculation: 406 R Axis:   37 Text Interpretation: Atrial fibrillation Abnormal ECG Confirmed by Dene Gentry [phone]) on 10/05/2019 11:40:27 AM   Radiology No results found.  Procedures Procedures (including critical care time)  Medications Ordered in ED Medications  sodium chloride flush (NS) 0.9 % injection 3 mL (3 mLs Intravenous Not Given 10/05/19 1138)    ED Course  I have reviewed the triage vital signs and the nursing notes.  Pertinent labs & imaging results that were available during my care of the patient were reviewed by me and considered in my  medical decision making (see chart for details).    MDM Rules/Calculators/A&P                      MDM  Screen complete  Michael Choi was evaluated in Emergency Department on 10/05/2019 for the symptoms described in the history of present illness. He was evaluated in the context of the global COVID-19 pandemic, which necessitated consideration that the patient might be at risk for infection with the SARS-CoV-2 virus that causes COVID-19. Institutional protocols and algorithms that pertain to the evaluation of patients at risk for COVID-19 are in a state of rapid change based on information released by regulatory bodies including the CDC and federal and state organizations. These policies and algorithms were followed during the patient's care in the ED.   Patient is presenting for evaluation of left-sided facial numbness.  Patient is concerned for possible CVA.  Patient is outside the timeframe for TPA.  Patient seen and evaluated by neurology.  Patient's work-up does not demonstrate evidence of clear acute pathology.  Patient is appropriate for discharge home.    Neurology does request CTA head and neck prior to discharge.  If no significant findings found patient okay to be discharged per neuro.  Patient is advised to continue Eliquis and  initiation of atorvastatin begun from the ED.  Strict return precautions given and understood.   Final Clinical Impression(s) / ED Diagnoses Final diagnoses:  Facial numbness    Rx / DC Orders ED Discharge Orders    None       Valarie Merino, MD 10/05/19 1532    Valarie Merino, MD 10/05/19 [id number]

## 2019-10-26 NOTE — Progress Notes (Signed)
Primary Care Physician: [name], MD Primary Electrophysiologist: Dr [name] Referring Physician: [facility]   Michael Choi is a 59 y.o. male with a history of atrial flutter s/p ablation 03/05/18 with Dr [name], NICM, HTN, OSA, polycythemia and persistent atrial fibrillation who presents for follow up in the [facility]. Patient reports that on 03/05/19 he had palpitations and SOB which lasted about an hour and then again later that night which persisted. He presented to the ER and was found to be in rate controlled afib. He did not undergo DCCV as his anticoagulation was stopped post flutter ablation and the duration of his present episode was unknown. Patient reports that he has been under a lot of stress with being out of work. Patient is s/p TEE/DCCV on 03/18/19. ECG post cardioversion showed NSR. Unfortunately, patient had ERAF. He states that he felt no different being in SR or in afib. He does report that the metoprolol has helped his symptoms tremendously.   On follow up today, patient reports he has done well since his last visit. He has intermittent symptoms of fatigue and SOB with exertion but overall he feels well in afib. He was seen at the ER recently for facial numbness and concern for acute CVA. CT and MRI were negative. There was evidence of small vessel disease and he was started on statin. He denies missed doses of Eliquis.   Today, he denies symptoms of chest pain, orthopnea, PND, lower extremity edema, dizziness, presyncope, syncope, snoring, daytime somnolence, bleeding, or neurologic sequela. The patient is tolerating medications without difficulties and is otherwise without complaint today.    Atrial Fibrillation Risk Factors:  he does have symptoms or diagnosis of sleep apnea.  He is not on CPAP. He denies any alcohol use. He denies history of rheumatic fever.   he has a BMI of Body mass index is 31.88 kg/m.Marland Kitchen Filed Weights   10/27/19  0836  Weight: 103.7 kg    Family History  Problem Relation Age of Onset  . Cancer Mother   . Heart disease Father        rheumatic heart disease s/p transplant  . Hypertension Brother      Atrial Fibrillation Management history:  Previous antiarrhythmic drugs: none Previous cardioversions: 11/2017, 03/18/19 Previous ablations: 03/05/18 flutter CHADS2VASC score: 3 (CHF, HTN, aortic plaque) Anticoagulation history: Eliquis   Past Medical History:  Diagnosis Date  . A-fib ([place])   . Atrial flutter ([place])   . Atypical nevus 04/16/2016   left outer back (mild)   . Hypertension   . Melanoma [facility])    Past Surgical History:  Procedure Laterality Date  . A-FLUTTER ABLATION N/A 03/05/2018   Procedure: A-FLUTTER ABLATION;  Surgeon: [name], MD;  Location: [place] [facility];  Service: Cardiovascular;  Laterality: N/A;  . CARDIOVERSION N/A 11/27/2017   Procedure: CARDIOVERSION;  Surgeon: [name], MD;  Location: [place] [facility];  Service: Cardiovascular;  Laterality: N/A;  . CARDIOVERSION N/A 03/18/2019   Procedure: CARDIOVERSION;  Surgeon: [name], MD;  Location: [facility] ENDOSCOPY;  Service: Cardiovascular;  Laterality: N/A;  . KNEE ARTHROSCOPY    . TEE WITHOUT CARDIOVERSION N/A 11/27/2017   Procedure: TRANSESOPHAGEAL ECHOCARDIOGRAM (TEE);  Surgeon: [name], MD;  Location: [facility] ENDOSCOPY;  Service: Cardiovascular;  Laterality: N/A;  . TEE WITHOUT CARDIOVERSION N/A 03/18/2019   Procedure: TRANSESOPHAGEAL ECHOCARDIOGRAM (TEE);  Surgeon: [name], MD;  Location: [facility];  Service: Cardiovascular;  Laterality:  N/A;    Current Outpatient Medications  Medication Sig Dispense Refill  . amLODipine (NORVASC) 5 MG tablet Take 5 mg by mouth at bedtime.   3  . apixaban (ELIQUIS) 5 MG TABS tablet Take 1 tablet (5 mg total) by mouth 2 (two) times daily. 60 tablet 0  . atorvastatin (LIPITOR) 20 MG tablet Take 1 tablet (20 mg total) by mouth daily. 30  tablet 1  . losartan (COZAAR) 50 MG tablet Take 50 mg by mouth daily.  3  . MAGNESIUM PO Take 1 tablet by mouth daily with breakfast.     . metoprolol tartrate (LOPRESSOR) 25 MG tablet TAKE 1 TABLET BY MOUTH 2 TIMES DAILY.keep appt for refills 60 tablet 2  . multivitamin (THERAGRAN) per tablet Take 1 tablet by mouth daily.    . naproxen sodium (ALEVE) 220 MG tablet Take 220-440 mg by mouth 2 (two) times daily as needed (for pain or headaches).    . Omega-3 Fatty Acids (FISH OIL PO) Take 1 capsule by mouth daily.    Marland Kitchen testosterone cypionate (DEPOTESTOSTERONE CYPIONATE) 200 MG/ML injection Inject 150 mg into the muscle every 7 (seven) days.     Marland Kitchen VITAMIN E PO Take 1 capsule by mouth daily with breakfast.     No current facility-administered medications for this encounter.    Allergies  Allergen Reactions  . Doxycycline Hives, Swelling and Other (See Comments)    "I felt a little swelling in my throat," so he ceased taking it  . Tetracycline Rash  . Latex Rash    Social History   Socioeconomic History  . Marital status: Single    Spouse name: Not on file  . Number of children: Not on file  . Years of education: Not on file  . Highest education level: Not on file  Occupational History  . Not on file  Tobacco Use  . Smoking status: Never Smoker  . Smokeless tobacco: Never Used  Substance and Sexual Activity  . Alcohol use: Yes    Alcohol/week: 1.0 standard drinks    Types: 1 Standard drinks or equivalent per week  . Drug use: No  . Sexual activity: Not on file  Other Topics Concern  . Not on file  Social History Narrative  . Not on file   Social Determinants of Health   Financial Resource Strain:   . Difficulty of Paying Living Expenses:   Food Insecurity:   . Worried About Charity fundraiser in the Last Year:   . Arboriculturist in the Last Year:   Transportation Needs:   . Film/video editor (Medical):   Marland Kitchen Lack of Transportation (Non-Medical):   Physical  Activity:   . Days of Exercise per Week:   . Minutes of Exercise per Session:   Stress:   . Feeling of Stress :   Social Connections:   . Frequency of Communication with Friends and Family:   . Frequency of Social Gatherings with Friends and Family:   . Attends Religious Services:   . Active Member of Clubs or Organizations:   . Attends Archivist Meetings:   Marland Kitchen Marital Status:   Intimate Partner Violence:   . Fear of Current or Ex-Partner:   . Emotionally Abused:   Marland Kitchen Physically Abused:   . Sexually Abused:      ROS- All systems are reviewed and negative except as per the HPI above.  Physical Exam: Vitals:   10/27/19 0836  BP: 112/78  Pulse:  77  Weight: 103.7 kg  Height: 5\' 11"  (1.803 m)    GEN- The patient is well appearing obese male, alert and oriented x 3 today.   HEENT-head normocephalic, atraumatic, sclera clear, conjunctiva pink, hearing intact, trachea midline. Lungs- Clear to ausculation bilaterally, normal work of breathing Heart- irregular rate and rhythm, no murmurs, rubs or gallops  GI- soft, NT, ND, + BS Extremities- no clubbing, cyanosis, or edema MS- no significant deformity or atrophy Skin- no rash or lesion Psych- euthymic mood, full affect Neuro- strength and sensation are intact   Wt Readings from Last 3 Encounters:  10/27/19 103.7 kg  10/05/19 102.1 kg  03/24/19 112.5 kg    EKG today demonstrates afib HR 77, QRS 90, QTc 400  Echo 06/16/18 demonstrated  - Left ventricle: The cavity size was moderately to severely   dilated. Wall thickness was normal. Systolic function was mildly   to moderately reduced. The estimated ejection fraction was in the   range of 40% to 45%. Diffuse hypokinesis. Although no diagnostic   regional wall motion abnormality was identified, this possibility   cannot be completely excluded on the basis of this study.   Features are consistent with a pseudonormal left ventricular   filling pattern, with  concomitant abnormal relaxation and   increased filling pressure (grade 2 diastolic dysfunction). - Aortic valve: Transvalvular velocity was within the normal range.   There was no stenosis. There was trivial regurgitation. Mean   gradient (S): 5 mm Hg. Valve area (VTI): 2.05 cm^2. - Mitral valve: Transvalvular velocity was within the normal range.   There was no evidence for stenosis. There was trivial   regurgitation. - Left atrium: The atrium was mildly dilated. - Right ventricle: The cavity size was mildly dilated. Wall   thickness was normal. Systolic function was normal. - Right atrium: The atrium was mildly dilated. - Tricuspid valve: There was trivial regurgitation. - Pulmonary arteries: Systolic pressure could not be accurately   estimated. - Inferior vena cava: The vessel was normal in size. The   respirophasic diameter changes were in the normal range (>= 50%),   consistent with normal central venous pressure. - Pericardium, extracardiac: There was no pericardial effusion.  Impressions:  - Compared to the TEE 11/27/2017, the rhythm is now sinus with   ectopy. Limited images available for side by side comparison.   Epic records are reviewed at length today  Assessment and Plan:  1. Persistent atrial fibrillation/atrial flutter S/p flutter ablation 03/05/18.  Patient remains in rate controlled afib today. Patient satisfied with present therapy. When he gets insurance, he may consider rhythm strategy.  Continue Eliquis 5 mg BID  Continue metoprolol 25 mg BID  This patients CHA2DS2-VASc Score and unadjusted Ischemic Stroke Rate (% per year) is equal to 3.2 % stroke rate/year from a score of 3  Above score calculated as 1 point each if present [CHF, HTN, DM, Vascular=MI/PAD/Aortic Plaque, Age if 65-74, or Male] Above score calculated as 2 points each if present [Age > 75, or Stroke/TIA/TE]   2. Obesity Body mass index is 31.88 kg/m. Lifestyle modification was  discussed and encouraged including regular physical activity and weight reduction. Patient has done well losing weight.   3. Obstructive sleep apnea Patient is not on CPAP therapy.  4. HTN Stable, no changes today  5. Nonischemic Cardiomyopathy EF 40-45% by last echo.  No signs or symptoms of fluid overload.   Follow up in the AF clinic in 6 months.  [facility] [address] [place], [place] [address] [phone] 10/27/2019 9:09 AM

## 2019-10-27 ENCOUNTER — Ambulatory Visit (HOSPITAL_COMMUNITY)
Admission: RE | Admit: 2019-10-27 | Discharge: 2019-10-27 | Disposition: A | Discharge status: Home / Self Care | Payer: Self-pay | Source: Ambulatory Visit | Attending: Physician Assistant | Admitting: Physician Assistant

## 2019-10-27 ENCOUNTER — Other Ambulatory Visit: Payer: Self-pay

## 2019-10-27 ENCOUNTER — Encounter (HOSPITAL_COMMUNITY): Payer: Self-pay | Admitting: Physician Assistant

## 2019-10-27 VITALS — BP 112/78 | HR 77 | Ht 71.0 in | Wt 228.6 lb

## 2019-10-27 DIAGNOSIS — Z881 Allergy status to other antibiotic agents status: Secondary | ICD-10-CM | POA: Insufficient documentation

## 2019-10-27 DIAGNOSIS — Z8249 Family history of ischemic heart disease and other diseases of the circulatory system: Secondary | ICD-10-CM | POA: Insufficient documentation

## 2019-10-27 DIAGNOSIS — Z79899 Other long term (current) drug therapy: Secondary | ICD-10-CM | POA: Insufficient documentation

## 2019-10-27 DIAGNOSIS — I428 Other cardiomyopathies: Secondary | ICD-10-CM | POA: Insufficient documentation

## 2019-10-27 DIAGNOSIS — Z85820 Personal history of malignant melanoma of skin: Secondary | ICD-10-CM | POA: Insufficient documentation

## 2019-10-27 DIAGNOSIS — D6869 Other thrombophilia: Secondary | ICD-10-CM

## 2019-10-27 DIAGNOSIS — Z7901 Long term (current) use of anticoagulants: Secondary | ICD-10-CM | POA: Insufficient documentation

## 2019-10-27 DIAGNOSIS — I4892 Unspecified atrial flutter: Secondary | ICD-10-CM | POA: Insufficient documentation

## 2019-10-27 DIAGNOSIS — I10 Essential (primary) hypertension: Secondary | ICD-10-CM | POA: Insufficient documentation

## 2019-10-27 DIAGNOSIS — E669 Obesity, unspecified: Secondary | ICD-10-CM | POA: Insufficient documentation

## 2019-10-27 DIAGNOSIS — Z6831 Body mass index (BMI) 31.0-31.9, adult: Secondary | ICD-10-CM | POA: Insufficient documentation

## 2019-10-27 DIAGNOSIS — G4733 Obstructive sleep apnea (adult) (pediatric): Secondary | ICD-10-CM | POA: Insufficient documentation

## 2019-10-27 DIAGNOSIS — I4819 Other persistent atrial fibrillation: Secondary | ICD-10-CM | POA: Insufficient documentation

## 2019-10-31 ENCOUNTER — Other Ambulatory Visit (HOSPITAL_COMMUNITY): Payer: Self-pay | Admitting: *Deleted

## 2019-10-31 MED ORDER — METOPROLOL TARTRATE 25 MG PO TABS: ORAL_TABLET | ORAL | 6 refills | Status: DC

## 2020-01-07 IMAGING — CT CT HEART MORP W/ CTA COR W/ SCORE W/ CA W/CM &/OR W/O CM
4 of 7 series · 8 of 20 positions shown, 9 images · IV contrast (APPLIED)
Comparison: None.

Addendum:
EXAM:
OVER-READ INTERPRETATION  CT CHEST

The following report is an over-read performed by radiologist Dr.
Ferienhaus Erxleben [[email or web address]] on 06/07/2018. This
over-read does not include interpretation of cardiac or coronary
anatomy or pathology. The coronary calcium score/coronary CTA
interpretation by the cardiologist is attached.
CLINICAL DATA: 57-year-old male with chest pain, dyspnea and
cardiomyopathy believed to be tachycardia induced.
Cardiac/Coronary  CT
TECHNIQUE: The patient was scanned on a Phillips Force scanner.

[Series 8: best diast 67 % · axial · 0.39mm/px · z∈[+1171,+1216]mm · 2 of 341 slices shown, 3 images]
[im 114/341  vessel]
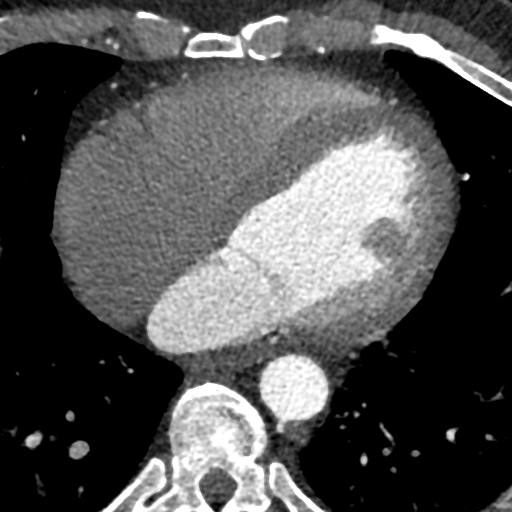
[im 114/341  lung]
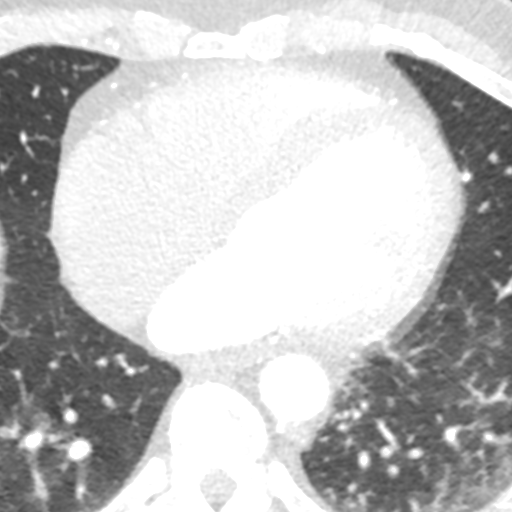
[im 227/341  vessel]
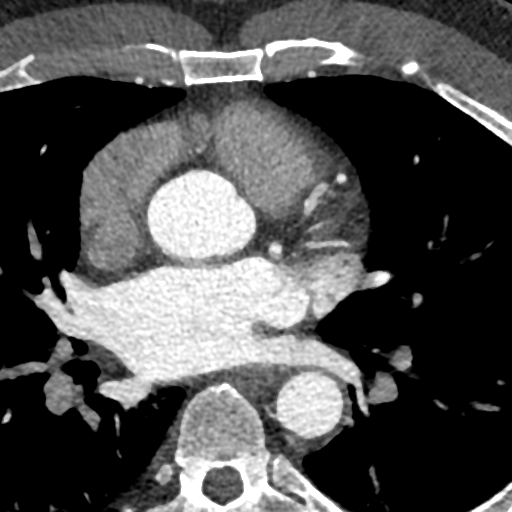

[Series 9: best syst 44 % · axial · 0.39mm/px · z∈[+1171,+1216]mm · 2 of 341 slices shown]
[im 114/341  vessel]
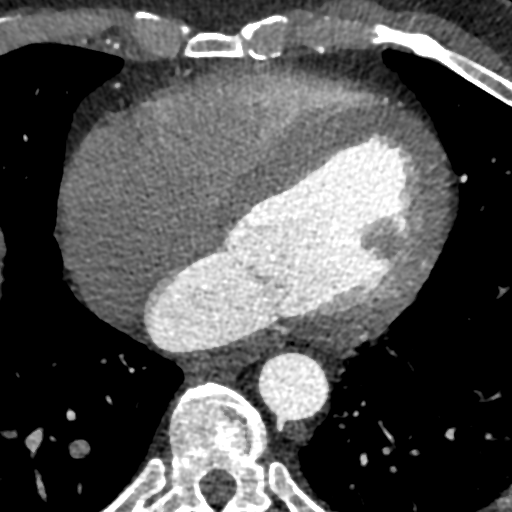
[im 227/341  vessel]
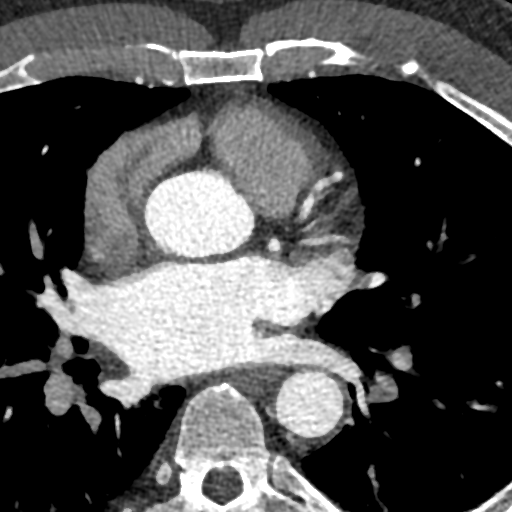

[Series 10: ts diast sharp 67 % · axial · 0.39mm/px · z∈[+1171,+1216]mm · 2 of 341 slices shown]
[im 114/341  lung]
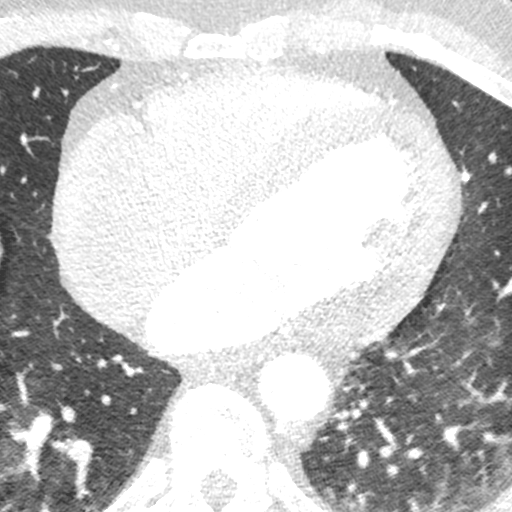
[im 227/341  lung]
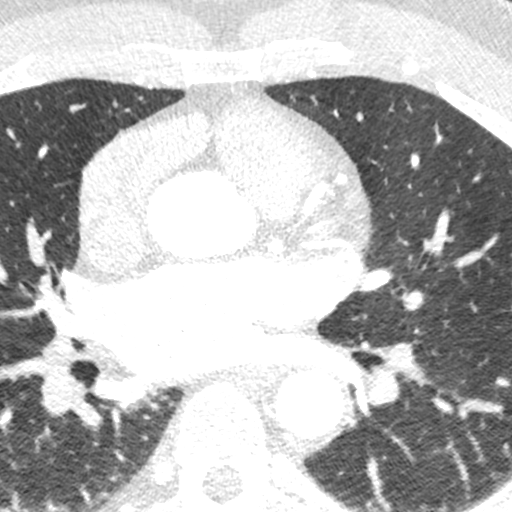

[Series 11: ts syst sharp 44 % · axial · 0.39mm/px · z∈[+1171,+1216]mm · 2 of 341 slices shown]
[im 114/341  lung]
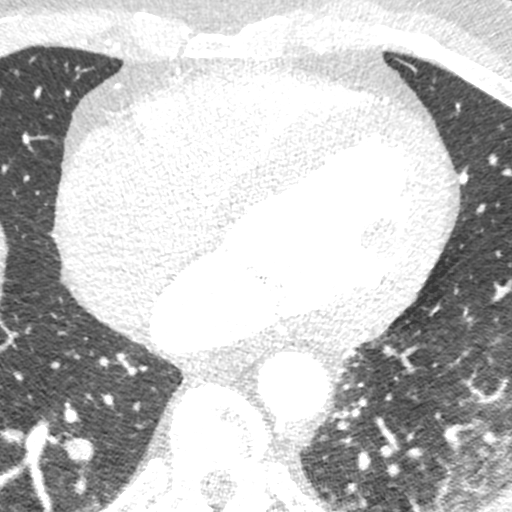
[im 227/341  lung]
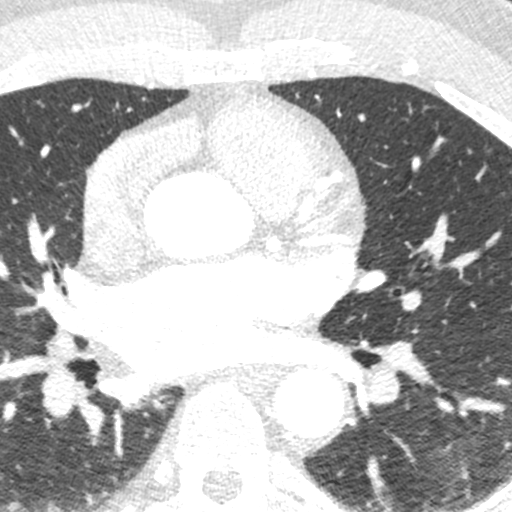

[8 of 20 positions shown; findings below may reference images not displayed]

FINDINGS: Aortic atherosclerosis. Within the visualized portions of the thorax
there are no suspicious appearing pulmonary nodules or masses, there
is no acute consolidative airspace disease, no pleural effusions, no
pneumothorax and no lymphadenopathy. Visualized portions of the
upper abdomen demonstrates a 1.5 cm low-attenuation lesion in
segment 7 of the liver, compatible with a simple cyst. There are no
aggressive appearing lytic or blastic lesions noted in the
visualized portions of the skeleton.
IMPRESSION: 1.  Aortic Atherosclerosis ([id number].F).
FINDINGS: A 120 kV prospective scan was triggered in the descending thoracic
aorta at 111 HU's. Axial non-contrast 3 mm slices were carried out
through the heart. The data set was analyzed on a dedicated work
station and scored using the Agatson method. Gantry rotation speed
was 250 msecs and collimation was .6 mm. No beta blockade and 0.8 mg
of sl NTG was given. The 3D data set was reconstructed in 5%
intervals of the 67-82 % of the R-R cycle. Diastolic phases were
analyzed on a dedicated work station using MPR, MIP and VRT modes.
The patient received 80 cc of contrast.

Aorta: Normal size. No calcifications, minimal atheroma. No
dissection.

Aortic Valve:  Trileaflet.  Trivial calcifications.

Coronary Arteries:  Normal coronary origin.  Right dominance.

Left main is a large artery that gives rise to LAD and LCX arteries.
Left main has no plaque.

LAD is a large vessel that gives rise to two diagonal arteries and
has only luminal non-calcified irregularities.

LCX is a large dominant artery that gives rise to one large OM1
branch, PDA, PLA is poorly visualized. There is no plaque.

RCA is a small non-dominant artery that has no plaque.

Other findings:

Normal pulmonary vein drainage into the left atrium.

Normal let atrial appendage without a thrombus.

Normal size of the pulmonary artery.
IMPRESSION: 1. Coronary calcium score of 0. This was 0 percentile for age and
sex matched control.

2. Normal coronary origin with right dominance.

3. Minimal luminal irregularities, no CAD. Findings consistent with
no-ischemic cardiomyopathy.

*** End of Addendum ***

## 2020-01-25 ENCOUNTER — Encounter: Payer: Self-pay | Admitting: *Deleted

## 2020-02-03 ENCOUNTER — Encounter: Payer: Self-pay | Admitting: Physician Assistant

## 2020-02-03 ENCOUNTER — Other Ambulatory Visit: Payer: Self-pay

## 2020-02-03 ENCOUNTER — Ambulatory Visit (INDEPENDENT_AMBULATORY_CARE_PROVIDER_SITE_OTHER): Payer: Self-pay | Admitting: Physician Assistant

## 2020-02-03 DIAGNOSIS — Z1283 Encounter for screening for malignant neoplasm of skin: Secondary | ICD-10-CM

## 2020-02-03 DIAGNOSIS — Z86018 Personal history of other benign neoplasm: Secondary | ICD-10-CM

## 2020-02-03 DIAGNOSIS — L821 Other seborrheic keratosis: Secondary | ICD-10-CM

## 2020-02-03 NOTE — Progress Notes (Signed)
° °  Follow-Up Visit   Subjective  Michael Choi is a 59 y.o. male who presents for the following: Annual Exam (right chest- white spot- comes and goes- can feel it).   The following portions of the chart were reviewed this encounter and updated as appropriate:     Objective  Well appearing patient in no apparent distress; mood and affect are within normal limits.  A full examination was performed including scalp, head, eyes, ears, nose, lips, neck, chest, axillae, abdomen, back, buttocks, bilateral upper extremities, bilateral lower extremities, hands, feet, fingers, toes, fingernails, and toenails. All findings within normal limits unless otherwise noted below.  Objective  Head -to toe: No atypical nevi No signs of non-mole skin cancer.   Objective  Right Breast: Stuck-on, waxy, tan-brown papules and plaques. --Discussed benign etiology and prognosis.   Objective  Left Lower Back: All scars clear  Assessment & Plan  Screening exam for skin cancer Head -to toe  Seborrheic keratosis Right Breast  observe  History of dysplastic nevus Left Lower Back  observe   I, Kamilo Och, PA-C, have reviewed all documentation's for this visit.  The documentation on 02/03/20 for the exam, diagnosis, procedures and orders are all accurate and complete.

## 2020-02-03 NOTE — Patient Instructions (Signed)
Seborrheic Keratosis °A seborrheic keratosis is a common, noncancerous (benign) skin growth. These growths are velvety, waxy, rough, tan, brown, or black spots that appear on the skin. These skin growths can be flat or raised, and scaly. °What are the causes? °The cause of this condition is not known. °What increases the risk? °You are more likely to develop this condition if you: °· Have a family history of seborrheic keratosis. °· Are 50 or older. °· Are pregnant. °· Have had estrogen replacement therapy. °What are the signs or symptoms? °Symptoms of this condition include growths on the face, chest, shoulders, back, or other areas. These growths: °· Are usually painless, but may become irritated and itchy. °· Can be yellow, brown, black, or other colors. °· Are slightly raised or have a flat surface. °· Are sometimes rough or wart-like in texture. °· Are often velvety or waxy on the surface. °· Are round or oval-shaped. °· Often occur in groups, but may occur as a single growth. °How is this diagnosed? °This condition is diagnosed with a medical history and physical exam. °· A sample of the growth may be tested (skin biopsy). °· You may need to see a skin specialist (dermatologist). °How is this treated? °Treatment is not usually needed for this condition, unless the growths are irritated or bleed often. °· You may also choose to have the growths removed if you do not like their appearance. °? Most commonly, these growths are treated with a procedure in which liquid nitrogen is applied to "freeze" off the growth (cryosurgery). °? They may also be burned off with electricity (electrocautery) or removed by scraping (curettage). °Follow these instructions at home: °· Watch your growth for any changes. °· Keep all follow-up visits as told by your health care provider. This is important. °· Do not scratch or pick at the growth or growths. This can cause them to become irritated or infected. °Contact a health care  provider if: °· You suddenly have many new growths. °· Your growth bleeds, itches, or hurts. °· Your growth suddenly becomes larger or changes color. °Summary °· A seborrheic keratosis is a common, noncancerous (benign) skin growth. °· Treatment is not usually needed for this condition, unless the growths are irritated or bleed often. °· Watch your growth for any changes. °· Contact a health care provider if you suddenly have many new growths or your growth suddenly becomes larger or changes color. °· Keep all follow-up visits as told by your health care provider. This is important. °This information is not intended to replace advice given to you by your health care provider. Make sure you discuss any questions you have with your health care provider. °Document Revised: 11/19/2017 Document Reviewed: 11/19/2017 °Elsevier Patient Education © 2020 [employer] ° °

## 2020-03-12 ENCOUNTER — Other Ambulatory Visit (HOSPITAL_COMMUNITY): Payer: Self-pay | Admitting: *Deleted

## 2020-03-12 MED ORDER — APIXABAN 5 MG PO TABS: 5.0000 mg | ORAL_TABLET | Freq: Two times a day (BID) | ORAL | 3 refills | Status: DC

## 2020-04-17 ENCOUNTER — Telehealth (HOSPITAL_COMMUNITY): Payer: Self-pay | Admitting: *Deleted

## 2020-04-17 NOTE — Telephone Encounter (Signed)
Patient approved for patient assistance through 04/16/21 for eliquis.  PAT- [id number]

## 2020-05-10 ENCOUNTER — Other Ambulatory Visit (HOSPITAL_COMMUNITY): Payer: Self-pay | Admitting: Physician Assistant

## 2020-06-03 ENCOUNTER — Emergency Department (HOSPITAL_COMMUNITY)
Admission: EM | Admit: 2020-06-03 | Discharge: 2020-06-04 | Disposition: A | Discharge status: Home / Self Care | Payer: Self-pay | Attending: Emergency Medicine | Admitting: Emergency Medicine

## 2020-06-03 ENCOUNTER — Encounter (HOSPITAL_COMMUNITY): Payer: Self-pay | Admitting: Emergency Medicine

## 2020-06-03 ENCOUNTER — Emergency Department (HOSPITAL_COMMUNITY): Payer: Self-pay

## 2020-06-03 DIAGNOSIS — R0781 Pleurodynia: Secondary | ICD-10-CM | POA: Insufficient documentation

## 2020-06-03 DIAGNOSIS — Z91040 Latex allergy status: Secondary | ICD-10-CM | POA: Insufficient documentation

## 2020-06-03 DIAGNOSIS — M549 Dorsalgia, unspecified: Secondary | ICD-10-CM | POA: Insufficient documentation

## 2020-06-03 DIAGNOSIS — F419 Anxiety disorder, unspecified: Secondary | ICD-10-CM | POA: Insufficient documentation

## 2020-06-03 DIAGNOSIS — I10 Essential (primary) hypertension: Secondary | ICD-10-CM | POA: Insufficient documentation

## 2020-06-03 DIAGNOSIS — Z79899 Other long term (current) drug therapy: Secondary | ICD-10-CM | POA: Insufficient documentation

## 2020-06-03 LAB — CBC
HCT: 54.1 % — ABNORMAL HIGH (ref 39.0–52.0)
Hemoglobin: 18.2 g/dL — ABNORMAL HIGH (ref 13.0–17.0)
MCH: 31.6 pg (ref 26.0–34.0)
MCHC: 33.6 g/dL (ref 30.0–36.0)
MCV: 93.9 fL (ref 80.0–100.0)
Platelets: 193 10*3/uL (ref 150–400)
RBC: 5.76 MIL/uL (ref 4.22–5.81)
RDW: 13.2 % (ref 11.5–15.5)
WBC: 5.6 10*3/uL (ref 4.0–10.5)
nRBC: 0 % (ref 0.0–0.2)

## 2020-06-03 LAB — BASIC METABOLIC PANEL
Anion gap: 9 (ref 5–15)
BUN: 12 mg/dL (ref 6–20)
CO2: 26 mmol/L (ref 22–32)
Calcium: 8.9 mg/dL (ref 8.9–10.3)
Chloride: 102 mmol/L (ref 98–111)
Creatinine, Ser: 1.23 mg/dL (ref 0.61–1.24)
GFR, Estimated: >60 mL/min (ref >60)
Glucose, Bld: 97 mg/dL (ref 70–99)
Potassium: 5 mmol/L (ref 3.5–5.1)
Sodium: 137 mmol/L (ref 135–145)

## 2020-06-03 LAB — TROPONIN I (HIGH SENSITIVITY): Troponin I (High Sensitivity): 13 ng/L (ref <18)

## 2020-06-03 LAB — D-DIMER, QUANTITATIVE: D-Dimer, Quant: <0.27 ug/mL-FEU (ref 0.00–0.50)

## 2020-06-03 NOTE — ED Provider Notes (Signed)
[facility] EMERGENCY DEPARTMENT Provider Note   CSN: [id number] Arrival date & time: 06/03/20  2112     History Chief Complaint  Patient presents with  . Chest Pain    Michael Choi is a 59 y.o. male.  59 y/o male with hx of atrial flutter s/p ablation 03/05/18 (on chronic Eliquis), NICM (EF 45-50%), HTN, OSA, polycythemia presents to the emergency department for evaluation of chest and back pain which began approximately 2 hours ago.  Symptoms began while straining to have a bowel movement.  He has had constant pain in his back, just medial to his left shoulder blade.  Also reports constant, waxing and waning left-sided chest pain.  Chest pain is aggravated with deep breathing and position change, though this does not worsen his back pain.  He feels as though he is unable to take a deep breath secondary to his chest discomfort resulting in associated shortness of breath.  Reports feeling winded with ambulation from the waiting room to exam room.  He has not had any associated fever, syncope, numbness or paresthesias, abdominal pain since symptom onset 2 hours ago.  Has been fully compliant with his Eliquis.  Was seen 2 days ago by PCP for sinus infection.  Was started on Ceftin for this.  Reports recently negative COVID and flu tests.  The history is provided by the patient. No language interpreter was used.       Past Medical History:  Diagnosis Date  . A-fib ([place])   . Atrial flutter ([place])   . Atypical nevus 04/16/2016   left outer back (mild)   . Hypertension   . Melanoma [facility])     Patient Active Problem List   Diagnosis Date Noted  . Persistent atrial fibrillation ([place]) 10/27/2019  . Secondary hypercoagulable state ([place]) 10/27/2019  . Atrial flutter ([place]) 11/26/2017  . Hypogonadism male 09/27/2017  . Secondary erythrocytosis 08/28/2017  . Prediabetes 04/03/2015  . HTN (hypertension) 10/13/2011  . Attention deficit hyperactivity disorder (ADHD) 04/02/2009     Past Surgical History:  Procedure Laterality Date  . A-FLUTTER ABLATION N/A 03/05/2018   Procedure: A-FLUTTER ABLATION;  Surgeon: [name], MD;  Location: [place] CV LAB;  Service: Cardiovascular;  Laterality: N/A;  . CARDIOVERSION N/A 11/27/2017   Procedure: CARDIOVERSION;  Surgeon: [name], MD;  Location: [facility] ENDOSCOPY;  Service: Cardiovascular;  Laterality: N/A;  . CARDIOVERSION N/A 03/18/2019   Procedure: CARDIOVERSION;  Surgeon: [name], MD;  Location: [facility] ENDOSCOPY;  Service: Cardiovascular;  Laterality: N/A;  . KNEE ARTHROSCOPY    . TEE WITHOUT CARDIOVERSION N/A 11/27/2017   Procedure: TRANSESOPHAGEAL ECHOCARDIOGRAM (TEE);  Surgeon: [name], MD;  Location: [facility] ENDOSCOPY;  Service: Cardiovascular;  Laterality: N/A;  . TEE WITHOUT CARDIOVERSION N/A 03/18/2019   Procedure: TRANSESOPHAGEAL ECHOCARDIOGRAM (TEE);  Surgeon: [name], MD;  Location: [facility] ENDOSCOPY;  Service: Cardiovascular;  Laterality: N/A;       Family History  Problem Relation Age of Onset  . Cancer Mother   . Heart disease Father        rheumatic heart disease s/p transplant  . Hypertension Brother     Social History   Tobacco Use  . Smoking status: Never Smoker  . Smokeless tobacco: Never Used  Vaping Use  . Vaping Use: Never used  Substance Use Topics  . Alcohol use: Yes    Alcohol/week: 1.0 standard drink    Types: 1 Standard drinks or equivalent per week  .  Drug use: No    Home Medications Prior to Admission medications   Medication Sig Start Date End Date Taking? Authorizing Provider  amLODipine (NORVASC) 5 MG tablet Take 5 mg by mouth at bedtime.  12/31/16   [provider]  apixaban (ELIQUIS) 5 MG TABS tablet Take 1 tablet (5 mg total) by mouth 2 (two) times daily. 03/12/20 04/11/20  Fenton, Clint R, PA  atorvastatin (LIPITOR) 20 MG tablet Take 1 tablet (20 mg total) by mouth daily. 10/05/19   Valarie Merino, MD  losartan (COZAAR) 50 MG  tablet Take 50 mg by mouth daily. 01/20/17   [provider]  MAGNESIUM PO Take 1 tablet by mouth daily with breakfast.     [provider]  metoprolol tartrate (LOPRESSOR) 25 MG tablet TAKE 1 TABLET BY MOUTH 2 TIMES DAILY. 05/10/20   Fenton, Clint R, PA  multivitamin (THERAGRAN) per tablet Take 1 tablet by mouth daily.    [provider]  naproxen sodium (ALEVE) 220 MG tablet Take 220-440 mg by mouth 2 (two) times daily as needed (for pain or headaches).    [provider]  Omega-3 Fatty Acids (FISH OIL PO) Take 1 capsule by mouth daily.    [provider]  testosterone cypionate (DEPOTESTOSTERONE CYPIONATE) 200 MG/ML injection Inject 150 mg into the muscle every 7 (seven) days.  01/07/17   [provider]  VITAMIN E PO Take 1 capsule by mouth daily with breakfast.    [provider]    Allergies    Doxycycline, Tetracycline, and Latex  Review of Systems   Review of Systems  Ten systems reviewed and are negative for acute change, except as noted in the HPI.    Physical Exam Updated Vital Signs BP (!) 168/88 (BP Location: Right Arm)   Pulse 88   Temp 98.4 F (36.9 C) (Oral)   Resp 16   Ht 6' (1.829 m)   Wt 108.9 kg   SpO2 99%   BMI 32.55 kg/m   Physical Exam Vitals and nursing note reviewed.  Constitutional:      General: He is not in acute distress.    Appearance: He is well-developed. He is not diaphoretic.     Comments: Nontoxic appearing and in NAD  HENT:     Head: Normocephalic and atraumatic.  Eyes:     General: No scleral icterus.    Conjunctiva/sclera: Conjunctivae normal.  Cardiovascular:     Rate and Rhythm: Normal rate and regular rhythm.     Pulses: Normal pulses.  Pulmonary:     Effort: Pulmonary effort is normal. No respiratory distress.     Breath sounds: No stridor. No wheezing or rales.     Comments: Lungs CTAB. Respirations even and unlabored. Chest:     Chest wall: No tenderness.   Musculoskeletal:        General: Normal range of motion.     Cervical back: Normal range of motion.     Comments: No reproducible TTP to back  Skin:    General: Skin is warm and dry.     Coloration: Skin is not pale.     Findings: No erythema or rash.  Neurological:     Mental Status: He is alert and oriented to person, place, and time.     Coordination: Coordination normal.     Comments: Ambulatory with steady gait.  Psychiatric:        Mood and Affect: Mood is anxious.  Behavior: Behavior normal.     ED Results / Procedures / Treatments   Labs (all labs ordered are listed, but only abnormal results are displayed) Labs Reviewed  CBC - Abnormal; Notable for the following components:      Result Value   Hemoglobin 18.2 (*)    HCT 54.1 (*)    All other components within normal limits  BASIC METABOLIC PANEL  D-DIMER, QUANTITATIVE (NOT AT [facility])  TROPONIN I (HIGH SENSITIVITY)  TROPONIN I (HIGH SENSITIVITY)    EKG EKG Interpretation  Date/Time:  Sunday June 03 2020 21:16:23 EST Ventricular Rate:  77 PR Interval:    QRS Duration: 92 QT Interval:  356 QTC Calculation: 402 R Axis:   34 Text Interpretation: Atrial fibrillation Confirmed by Steinl, Kevin ([id number]) on 06/03/2020 9:50:55 PM   Radiology DG Chest 2 View  Result Date: 06/03/2020 CLINICAL DATA:  Chest pain, atrial fibrillation EXAM: CHEST - 2 VIEW COMPARISON:  03/06/2019 FINDINGS: The heart size and mediastinal contours are within normal limits. Both lungs are clear. The visualized skeletal structures are unremarkable. IMPRESSION: No active cardiopulmonary disease. Electronically Signed   By: Michael  Brown M.D.   On: 06/03/2020 21:57    Procedures Procedures (including critical care time)  Medications Ordered in ED Medications - No data to display  ED Course  I have reviewed the triage vital signs and the nursing notes.  Pertinent labs & imaging results that were available during my care of the  patient were reviewed by me and considered in my medical decision making (see chart for details).  Clinical Course as of Jun 04 221  Sun Jun 03, 2020  2319 Ambulatory with sats at or above 97% on room air. Minimal SOB with exertion, per RN. Pending delta troponin.   [KH]  2330 Reviewed work-up and discharge plan with patient.  He verbalizes understanding.  Recommended follow-up with his primary care doctor for blood pressure recheck.  He does report taking medication containing pseudoephedrine.  This is likely contributing to his high blood pressure today.   [KH]    Clinical Course User Index [KH] Isaiah Torok, PA-C   MDM Rules/Calculators/A&P                          59  year old male presents to the emergency department for evaluation of pleuritic pain in his chest with associated back pain.  Pain began while straining to have a bowel movement.  He is comfortable appearing, hemodynamically stable.  He has been experiencing some URI symptoms.  Suspect this is contributing to his pleurisy.  Cardiac evaluation in the ED has been reassuring.  EKG shows rate controlled atrial fibrillation.  Chest x-ray without acute cardiopulmonary abnormality.  Specifically, no pneumonia, pneumothorax, mediastinal widening, cardiomegaly.  Stable for discharge and outpatient primary care follow-up.  Return precautions discussed and provided. Patient discharged in stable condition with no unaddressed concerns.   Final Clinical Impression(s) / ED Diagnoses Final diagnoses:  Pleuritic chest pain  Hypertension not at goal    Rx / DC Orders ED Discharge Orders    None       Antonietta Breach, PA-C 06/04/20 Mervyn Gay, MD 06/04/20 [phone]

## 2020-06-03 NOTE — ED Notes (Signed)
Patient transported to X-ray 

## 2020-06-03 NOTE — ED Provider Notes (Signed)
Patient seen/examined in the Emergency Department in conjunction with Advanced Practice Provider [facility] Patient reports pleuritic type CP, also reports positional back pain Reports recent cough and night sweats Exam : awake/alert, no distress, no murmurs noted, no focal weakness noted Pt has been ambulatory Plan: labs overall reassuring.  Awaiting repeat troponin If negative he will be safe for d/c home Offered COVID testing but he reports he gets tested weekly at work and will get tested tomorrow     Ripley Fraise, MD 06/03/20 2356

## 2020-06-03 NOTE — ED Notes (Signed)
Pt ambulated well within the room on pulse oximetry; oxygen saturation maintained at 97% and above on room air. Very minimal SOB noted by this RN.

## 2020-06-03 NOTE — ED Triage Notes (Signed)
Pt reports he started having L sided CP with radiating through to back, worse with deep breathing. Pain onset 1.5 hours ago while straining having BM. Chronic afib, pt is on Eliquis

## 2020-06-04 LAB — TROPONIN I (HIGH SENSITIVITY): Troponin I (High Sensitivity): 9 ng/L (ref <18)

## 2020-06-04 NOTE — Discharge Instructions (Signed)
Your work-up in the emergency department today has been reassuring.  Your blood pressure has been elevated.  We recommend you have this rechecked within the week by your primary care doctor.  Continue your daily prescribed medicines.  Return for new or concerning symptoms.

## 2020-06-04 NOTE — ED Notes (Signed)
Patient verbalizes understanding of discharge instructions. Opportunity for questioning and answers were provided. Armband removed by staff, pt discharged from ED ambulatory to home.  

## 2020-06-06 ENCOUNTER — Telehealth: Payer: Self-pay

## 2020-06-06 NOTE — Telephone Encounter (Signed)
NOTES ON FILE FROM [place] [facility] [phone], SENT REFERRAL TO SCHEDULING

## 2020-06-08 ENCOUNTER — Ambulatory Visit (HOSPITAL_COMMUNITY): Payer: Self-pay | Admitting: Physician Assistant

## 2020-07-02 ENCOUNTER — Other Ambulatory Visit (HOSPITAL_COMMUNITY): Payer: Self-pay | Admitting: *Deleted

## 2020-07-02 MED ORDER — APIXABAN 5 MG PO TABS: 5.0000 mg | ORAL_TABLET | Freq: Two times a day (BID) | ORAL | 3 refills | Status: DC

## 2020-07-11 ENCOUNTER — Other Ambulatory Visit: Payer: Self-pay | Admitting: Internal Medicine

## 2020-07-11 ENCOUNTER — Encounter: Payer: Self-pay | Admitting: Internal Medicine

## 2020-07-11 ENCOUNTER — Ambulatory Visit (INDEPENDENT_AMBULATORY_CARE_PROVIDER_SITE_OTHER): Payer: 59 | Admitting: Internal Medicine

## 2020-07-11 ENCOUNTER — Other Ambulatory Visit: Payer: Self-pay

## 2020-07-11 VITALS — BP 138/78 | HR 74 | Ht 72.0 in | Wt 242.2 lb

## 2020-07-11 DIAGNOSIS — I4892 Unspecified atrial flutter: Secondary | ICD-10-CM | POA: Diagnosis not present

## 2020-07-11 DIAGNOSIS — I4819 Other persistent atrial fibrillation: Secondary | ICD-10-CM | POA: Diagnosis not present

## 2020-07-11 DIAGNOSIS — Z01812 Encounter for preprocedural laboratory examination: Secondary | ICD-10-CM | POA: Diagnosis not present

## 2020-07-11 NOTE — H&P (View-Only) (Signed)
[facility]     Patient Care Team: [name], MD as PCP - General (Family Medicine)   HPI  Michael Choi is a 59 y.o. male Seen in followup for atrial flutter for which he underwent ablation 8/19  OSA-mild, previously untreated.   Has developed atrial fibrillation post ablation.   ECG 10/19-sinus 8/20-A. fib 8/20-sinus (post TEE guided cardioversion) 3/21   Atrial fib 11/21-A. fib Notes impaired exercise tolerance dizziness and fatigue in addition to palpitations with his atrial fibrillation.  Taking Eliquis without bleeding  No chest pain.  Now insured    DATE TEST EF   5/19 Echo   45-50 %   5/19 TEE  30-35 %   8/20 TEE 27-06%    Thromboembolic risk factors (, HTN-1, CHF-1) for a CHADSVASc Score of 2  Date Cr K Hgb  8/19 1.29 5.0 17.9           Records and Results Reviewed   Past Medical History:  Diagnosis Date  . A-fib ([place])   . Atrial flutter ([place])   . Atypical nevus 04/16/2016   left outer back (mild)   . Hypertension   . Melanoma [facility])     Past Surgical History:  Procedure Laterality Date  . A-FLUTTER ABLATION N/A 03/05/2018   Procedure: A-FLUTTER ABLATION;  Surgeon: [name], MD;  Location: [place] CV LAB;  Service: Cardiovascular;  Laterality: N/A;  . CARDIOVERSION N/A 11/27/2017   Procedure: CARDIOVERSION;  Surgeon: [name], MD;  Location: [place] ENDOSCOPY;  Service: Cardiovascular;  Laterality: N/A;  . CARDIOVERSION N/A 03/18/2019   Procedure: CARDIOVERSION;  Surgeon: [name], MD;  Location: [facility] ENDOSCOPY;  Service: Cardiovascular;  Laterality: N/A;  . KNEE ARTHROSCOPY    . TEE WITHOUT CARDIOVERSION N/A 11/27/2017   Procedure: TRANSESOPHAGEAL ECHOCARDIOGRAM (TEE);  Surgeon: [name], MD;  Location: [facility] ENDOSCOPY;  Service: Cardiovascular;  Laterality: N/A;  . TEE WITHOUT CARDIOVERSION N/A 03/18/2019   Procedure: TRANSESOPHAGEAL ECHOCARDIOGRAM (TEE);  Surgeon: [name], MD;  Location: [facility]  ENDOSCOPY;  Service: Cardiovascular;  Laterality: N/A;    Current Meds  Medication Sig  . ALPRAZolam (XANAX) 0.5 MG tablet Take 0.5 tablets by mouth as needed for anxiety.  Marland Kitchen amLODipine (NORVASC) 5 MG tablet Take 5 mg by mouth at bedtime.   Marland Kitchen apixaban (ELIQUIS) 5 MG TABS tablet Take 1 tablet (5 mg total) by mouth 2 (two) times daily.  Marland Kitchen losartan (COZAAR) 50 MG tablet Take 50 mg by mouth daily.  Marland Kitchen MAGNESIUM PO Take 1 tablet by mouth daily with breakfast.   . metoprolol tartrate (LOPRESSOR) 25 MG tablet TAKE 1 TABLET BY MOUTH 2 TIMES DAILY.  . multivitamin (THERAGRAN) per tablet Take 1 tablet by mouth daily.  . naproxen sodium (ALEVE) 220 MG tablet Take 220-440 mg by mouth 2 (two) times daily as needed (for pain or headaches).  . Omega-3 Fatty Acids (FISH OIL PO) Take 1 capsule by mouth daily.  Marland Kitchen terbinafine (LAMISIL) 250 MG tablet Take 1 tablet by mouth daily.  Marland Kitchen testosterone cypionate (DEPOTESTOSTERONE CYPIONATE) 200 MG/ML injection Inject 150 mg into the muscle every 7 (seven) days.     Allergies  Allergen Reactions  . Doxycycline Hives, Swelling and Other (See Comments)    "I felt a little swelling in my throat," so he ceased taking it  . Tetracycline Rash  . Latex Rash      Review of Systems negative except from HPI and PMH  Physical Exam BP  138/78   Pulse 74   Ht 6' (1.829 m)   Wt 242 lb 3.2 oz (109.9 kg)   SpO2 96%   BMI 32.85 kg/m  Well developed and well nourished in no acute distress HENT normal Neck supple with JVP-flat Clear Device pocket well healed; without hematoma or erythema.  There is no tethering  Irregularly irregular rhythm without murmurs or gallops Abd-soft with active BS No Clubbing cyanosis   edema Skin-warm and dry A & Oriented  Grossly normal sensory and motor function  ECG atrial fibrillation at 74 Intervals-/09/37   Assessment and  Plan Atrial Flutter  Nonischemic cardiomyopathy  Hypertension  Polycythemia  Dyspnea on  exertion/congestive heart failure class IIb  Chest discomfort-exertional    Persistent atrial fibrillation.  Mild cardiomyopathy.  Hence, antiarrhythmic options would include dofetilide versus amiodarone.  Alternative strategy would be ablation.  We will plan to pursue cardioversion.  We will also need an echocardiogram to look at left atrial size and an appointment with Dr. Domenica Fail to consider ablation.  Most recent sleep study demonstrated only mild sleep apnea.  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  On Anticoagulation;  No bleeding issues  BP reasonably controlled

## 2020-07-11 NOTE — Progress Notes (Signed)
[facility]     Patient Care Team: [name], MD as PCP - General (Family Medicine)   HPI  Michael Choi is a 59 y.o. male Seen in followup for atrial flutter for which he underwent ablation 8/19  OSA-mild, previously untreated.   Has developed atrial fibrillation post ablation.   ECG 10/19-sinus 8/20-A. fib 8/20-sinus (post TEE guided cardioversion) 3/21   Atrial fib 11/21-A. fib Notes impaired exercise tolerance dizziness and fatigue in addition to palpitations with his atrial fibrillation.  Taking Eliquis without bleeding  No chest pain.  Now insured    DATE TEST EF   5/19 Echo   45-50 %   5/19 TEE  30-35 %   8/20 TEE 27-06%    Thromboembolic risk factors (, HTN-1, CHF-1) for a CHADSVASc Score of 2  Date Cr K Hgb  8/19 1.29 5.0 17.9           Records and Results Reviewed   Past Medical History:  Diagnosis Date  . A-fib ([place])   . Atrial flutter ([place])   . Atypical nevus 04/16/2016   left outer back (mild)   . Hypertension   . Melanoma [facility])     Past Surgical History:  Procedure Laterality Date  . A-FLUTTER ABLATION N/A 03/05/2018   Procedure: A-FLUTTER ABLATION;  Surgeon: [name], MD;  Location: [place] CV LAB;  Service: Cardiovascular;  Laterality: N/A;  . CARDIOVERSION N/A 11/27/2017   Procedure: CARDIOVERSION;  Surgeon: [name], MD;  Location: [place] ENDOSCOPY;  Service: Cardiovascular;  Laterality: N/A;  . CARDIOVERSION N/A 03/18/2019   Procedure: CARDIOVERSION;  Surgeon: [name], MD;  Location: [facility] ENDOSCOPY;  Service: Cardiovascular;  Laterality: N/A;  . KNEE ARTHROSCOPY    . TEE WITHOUT CARDIOVERSION N/A 11/27/2017   Procedure: TRANSESOPHAGEAL ECHOCARDIOGRAM (TEE);  Surgeon: [name], MD;  Location: [facility] ENDOSCOPY;  Service: Cardiovascular;  Laterality: N/A;  . TEE WITHOUT CARDIOVERSION N/A 03/18/2019   Procedure: TRANSESOPHAGEAL ECHOCARDIOGRAM (TEE);  Surgeon: [name], MD;  Location: [facility]  ENDOSCOPY;  Service: Cardiovascular;  Laterality: N/A;    Current Meds  Medication Sig  . ALPRAZolam (XANAX) 0.5 MG tablet Take 0.5 tablets by mouth as needed for anxiety.  Marland Kitchen amLODipine (NORVASC) 5 MG tablet Take 5 mg by mouth at bedtime.   Marland Kitchen apixaban (ELIQUIS) 5 MG TABS tablet Take 1 tablet (5 mg total) by mouth 2 (two) times daily.  Marland Kitchen losartan (COZAAR) 50 MG tablet Take 50 mg by mouth daily.  Marland Kitchen MAGNESIUM PO Take 1 tablet by mouth daily with breakfast.   . metoprolol tartrate (LOPRESSOR) 25 MG tablet TAKE 1 TABLET BY MOUTH 2 TIMES DAILY.  . multivitamin (THERAGRAN) per tablet Take 1 tablet by mouth daily.  . naproxen sodium (ALEVE) 220 MG tablet Take 220-440 mg by mouth 2 (two) times daily as needed (for pain or headaches).  . Omega-3 Fatty Acids (FISH OIL PO) Take 1 capsule by mouth daily.  Marland Kitchen terbinafine (LAMISIL) 250 MG tablet Take 1 tablet by mouth daily.  Marland Kitchen testosterone cypionate (DEPOTESTOSTERONE CYPIONATE) 200 MG/ML injection Inject 150 mg into the muscle every 7 (seven) days.     Allergies  Allergen Reactions  . Doxycycline Hives, Swelling and Other (See Comments)    "I felt a little swelling in my throat," so he ceased taking it  . Tetracycline Rash  . Latex Rash      Review of Systems negative except from HPI and PMH  Physical Exam BP  138/78   Pulse 74   Ht 6' (1.829 m)   Wt 242 lb 3.2 oz (109.9 kg)   SpO2 96%   BMI 32.85 kg/m  Well developed and well nourished in no acute distress HENT normal Neck supple with JVP-flat Clear Device pocket well healed; without hematoma or erythema.  There is no tethering  Irregularly irregular rhythm without murmurs or gallops Abd-soft with active BS No Clubbing cyanosis   edema Skin-warm and dry A & Oriented  Grossly normal sensory and motor function  ECG atrial fibrillation at 74 Intervals-/09/37   Assessment and  Plan Atrial Flutter  Nonischemic cardiomyopathy  Hypertension  Polycythemia  Dyspnea on  exertion/congestive heart failure class IIb  Chest discomfort-exertional    Persistent atrial fibrillation.  Mild cardiomyopathy.  Hence, antiarrhythmic options would include dofetilide versus amiodarone.  Alternative strategy would be ablation.  We will plan to pursue cardioversion.  We will also need an echocardiogram to look at left atrial size and an appointment with Dr. Domenica Fail to consider ablation.  Most recent sleep study demonstrated only mild sleep apnea.  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  On Anticoagulation;  No bleeding issues  BP reasonably controlled

## 2020-07-11 NOTE — Patient Instructions (Addendum)
Medication Instructions:  Your physician recommends that you continue on your current medications as directed. Please refer to the Current Medication list given to you today.  *If you need a refill on your cardiac medications before your next appointment, please call your pharmacy*   Lab Work: CBC and BMET today  If you have labs (blood work) drawn today and your tests are completely normal, you will receive your results only by:  [place] (if you have MyChart) OR  A paper copy in the mail If you have any lab test that is abnormal or we need to change your treatment, we will call you to review the results.   Testing/Procedures: Your physician has requested that you have an echocardiogram. Echocardiography is a painless test that uses sound waves to create images of your heart. It provides your doctor with information about the size and shape of your heart and how well your hearts chambers and valves are working. This procedure takes approximately one hour. There are no restrictions for this procedure.   Your physician has recommended that you have a Cardioversion (DCCV). Electrical Cardioversion uses a jolt of electricity to your heart either through paddles or wired patches attached to your chest. This is a controlled, usually prescheduled, procedure. Defibrillation is done under light anesthesia in the hospital, and you usually go home the day of the procedure. This is done to get your heart back into a normal rhythm. You are not awake for the procedure. Please see the instruction sheet given to you today.     Follow-Up: At [facility], you and your health needs are our priority.  As part of our continuing mission to provide you with exceptional heart care, we have created designated Provider Care Teams.  These Care Teams include your primary Cardiologist (physician) and Advanced Practice Providers (APPs -  Physician Assistants and Nurse Practitioners) who all work together to  provide you with the care you need, when you need it.  We recommend signing up for the patient portal called "MyChart".  Sign up information is provided on this After Visit Summary.  MyChart is used to connect with patients for Virtual Visits (Telemedicine).  Patients are able to view lab/test results, encounter notes, upcoming appointments, etc.  Non-urgent messages can be sent to your provider as well.   To learn more about what you can do with MyChart, go to [email or web address].    Your next appointment:   To be scheduled   You are scheduled for a Cardioversion on 07/18/2020 with Dr. Candee Furbish.  Please arrive at the [facility] (Main Entrance A) at [facility]: [address] [place], [place] [address] at 10:30am.  DIET: Nothing to eat or drink after midnight except a sip of water with medications (see medication instructions below)  Medication Instructions:  Continue your anticoagulant: Eliquis: You will need to continue your anticoagulant after procedure until you are told by your Provider that it is safe to stop.   Labs: Labs were completed to day 07/11/2020  Complete your Covid testing Monday, 07/16/2020 at 1:15pm at [place]. [place], [place].  This is a drive thru testing site.  Stay in your car and you will be directed to the appropriate testing line.  Once you have tested you will need to quarantine at home until the day of your procedure.     You must have a responsible person to drive you home and stay in the waiting area during your procedure. Failure to  do so could result in cancellation.  Bring your insurance cards.  *Special Note: Every effort is made to have your procedure done on time. Occasionally there are emergencies that occur at the hospital that may cause delays. Please be patient if a delay does occur.

## 2020-07-12 LAB — BASIC METABOLIC PANEL
BUN/Creatinine Ratio: 9 — ABNORMAL LOW (ref 10–24)
BUN: 13 mg/dL (ref 8–27)
CO2: 23 mmol/L (ref 20–29)
Calcium: 9.9 mg/dL (ref 8.6–10.2)
Chloride: 100 mmol/L (ref 96–106)
Creatinine, Ser: 1.41 mg/dL — ABNORMAL HIGH (ref 0.76–1.27)
GFR calc Af Amer: 62 mL/min/{1.73_m2} (ref >59)
GFR calc non Af Amer: 54 mL/min/{1.73_m2} — ABNORMAL LOW (ref >59)
Glucose: 87 mg/dL (ref 65–99)
Potassium: 4.8 mmol/L (ref 3.5–5.2)
Sodium: 140 mmol/L (ref 134–144)

## 2020-07-12 LAB — CBC
Hematocrit: 52.6 % — ABNORMAL HIGH (ref 37.5–51.0)
Hemoglobin: 18 g/dL — ABNORMAL HIGH (ref 13.0–17.7)
MCH: 31.6 pg (ref 26.6–33.0)
MCHC: 34.2 g/dL (ref 31.5–35.7)
MCV: 92 fL (ref 79–97)
Platelets: 277 10*3/uL (ref 150–450)
RBC: 5.69 x10E6/uL (ref 4.14–5.80)
RDW: 13.1 % (ref 11.6–15.4)
WBC: 8.3 10*3/uL (ref 3.4–10.8)

## 2020-07-16 ENCOUNTER — Inpatient Hospital Stay (HOSPITAL_COMMUNITY)
Admission: RE | Admit: 2020-07-16 | Discharge: 2020-07-16 | Disposition: A | Discharge status: Home / Self Care | Payer: 59 | Source: Ambulatory Visit

## 2020-07-16 ENCOUNTER — Telehealth: Payer: Self-pay | Admitting: Internal Medicine

## 2020-07-16 NOTE — Progress Notes (Signed)
Pt not tested for covid today 07/16/20 due to pt testing + for covid on 06/04/20 (Pt will bring proof on day of procedure). Based on the guidelines the pt is in the 90 day window to not retest. The pt is still expected to quarantine until their procedure. Therefore, the pt can still have the scheduled procedure.   These are the guidelines as follows:  Guidance: Patient previously tested + COVID; now past 90 day window seeking elective surgery (asymptomatic)  Retest patient If negative, proceed with surgery If positive, postpone surgery for 10 days from positive test Patient to quarantine for the (10 days) Do not retest again prior to surgery (even if scheduled a couple of weeks out) Use standard precautions for surgery

## 2020-07-16 NOTE — Telephone Encounter (Signed)
I called and spoke with Brandy in endoscopy lab at [facility]. She advised patient will not be Covid tested and that he should remain quarantined until the time of the procedure. She advised someone will call him tomorrow night to assess for concerning symptoms. I called patient and reviewed this information with him. He verbalized understanding and agreement and thanked me for the call.

## 2020-07-16 NOTE — Telephone Encounter (Signed)
Patient called and wanted to see if Dr. Odessa Fleming nurse can give him a call back

## 2020-07-16 NOTE — Telephone Encounter (Signed)
Pt said that he went to be tested before his procedure and since he tested positive in November, he was told he didn't need to be checked again for 3 months. He wanted to make sure that was correct because they would not do it again today.

## 2020-07-17 NOTE — Anesthesia Preprocedure Evaluation (Addendum)
Anesthesia Evaluation  Patient identified by MRN, date of birth, ID band Patient awake    Reviewed: Allergy & Precautions, NPO status , Patient's Chart, lab work & pertinent test results  Airway Mallampati: II  TM Distance: >3 FB Neck ROM: Full    Dental no notable dental hx. (+) Teeth Intact, Dental Advisory Given   Pulmonary neg pulmonary ROS,    Pulmonary exam normal breath sounds clear to auscultation       Cardiovascular hypertension, Normal cardiovascular exam+ dysrhythmias Atrial Fibrillation  Rhythm:Irregular Rate:Normal  8/20 Echo 1. The left ventricle has mildly reduced systolic function, with an  ejection fraction of 45-50%. Left ventricular diffuse hypokinesis.  2. The right ventricle has normal systolc function. The cavity was  normal.    Neuro/Psych PSYCHIATRIC DISORDERS negative neurological ROS     GI/Hepatic negative GI ROS, Neg liver ROS,   Endo/Other  negative endocrine ROS  Renal/GU negative Renal ROS     Musculoskeletal negative musculoskeletal ROS (+)   Abdominal (+) + obese,   Peds  Hematology   Anesthesia Other Findings   Reproductive/Obstetrics                            Anesthesia Physical Anesthesia Plan  ASA: III  Anesthesia Plan: General   Post-op Pain Management:    Induction:   PONV Risk Score and Plan: Treatment may vary due to age or medical condition  Airway Management Planned: Natural Airway and Nasal Cannula  Additional Equipment: None  Intra-op Plan:   Post-operative Plan:   Informed Consent: I have reviewed the patients History and Physical, chart, labs and discussed the procedure including the risks, benefits and alternatives for the proposed anesthesia with the patient or authorized representative who has indicated his/her understanding and acceptance.     Dental advisory given  Plan Discussed with: CRNA and  Anesthesiologist  Anesthesia Plan Comments:        Anesthesia Quick Evaluation

## 2020-07-18 ENCOUNTER — Ambulatory Visit (HOSPITAL_COMMUNITY)
Admission: RE | Admit: 2020-07-18 | Discharge: 2020-07-18 | Disposition: A | Discharge status: Home / Self Care | Payer: PRIVATE HEALTH INSURANCE | Attending: Cardiology | Admitting: Cardiology

## 2020-07-18 ENCOUNTER — Ambulatory Visit (HOSPITAL_COMMUNITY): Payer: PRIVATE HEALTH INSURANCE | Admitting: Anesthesiology

## 2020-07-18 ENCOUNTER — Encounter (HOSPITAL_COMMUNITY): Payer: Self-pay | Admitting: Cardiology

## 2020-07-18 ENCOUNTER — Other Ambulatory Visit: Payer: Self-pay

## 2020-07-18 ENCOUNTER — Encounter (HOSPITAL_COMMUNITY)
Admission: RE | Disposition: A | Discharge status: Home / Self Care | Payer: Self-pay | Source: Home / Self Care | Attending: Cardiology

## 2020-07-18 ENCOUNTER — Telehealth: Payer: Self-pay | Admitting: Internal Medicine

## 2020-07-18 DIAGNOSIS — I428 Other cardiomyopathies: Secondary | ICD-10-CM | POA: Insufficient documentation

## 2020-07-18 DIAGNOSIS — G4733 Obstructive sleep apnea (adult) (pediatric): Secondary | ICD-10-CM | POA: Diagnosis not present

## 2020-07-18 DIAGNOSIS — R0609 Other forms of dyspnea: Secondary | ICD-10-CM | POA: Insufficient documentation

## 2020-07-18 DIAGNOSIS — Z7901 Long term (current) use of anticoagulants: Secondary | ICD-10-CM | POA: Diagnosis not present

## 2020-07-18 DIAGNOSIS — I110 Hypertensive heart disease with heart failure: Secondary | ICD-10-CM | POA: Diagnosis not present

## 2020-07-18 DIAGNOSIS — Z881 Allergy status to other antibiotic agents status: Secondary | ICD-10-CM | POA: Insufficient documentation

## 2020-07-18 DIAGNOSIS — I4892 Unspecified atrial flutter: Secondary | ICD-10-CM | POA: Insufficient documentation

## 2020-07-18 DIAGNOSIS — Z79899 Other long term (current) drug therapy: Secondary | ICD-10-CM | POA: Insufficient documentation

## 2020-07-18 DIAGNOSIS — Z91040 Latex allergy status: Secondary | ICD-10-CM | POA: Insufficient documentation

## 2020-07-18 DIAGNOSIS — D751 Secondary polycythemia: Secondary | ICD-10-CM | POA: Diagnosis not present

## 2020-07-18 DIAGNOSIS — I4819 Other persistent atrial fibrillation: Secondary | ICD-10-CM | POA: Diagnosis present

## 2020-07-18 DIAGNOSIS — I509 Heart failure, unspecified: Secondary | ICD-10-CM | POA: Diagnosis not present

## 2020-07-18 HISTORY — PX: CARDIOVERSION: SHX1299

## 2020-07-18 SURGERY — CARDIOVERSION
Anesthesia: General

## 2020-07-18 MED ORDER — LIDOCAINE HCL (CARDIAC) PF 100 MG/5ML IV SOSY
PREFILLED_SYRINGE | INTRAVENOUS | Status: DC | PRN
  Administered 2020-07-18: 100 mg via INTRATRACHEAL

## 2020-07-18 MED ORDER — SODIUM CHLORIDE 0.9 % IV SOLN: INTRAVENOUS | Status: DC | PRN

## 2020-07-18 MED ORDER — PROPOFOL 10 MG/ML IV BOLUS
INTRAVENOUS | Status: DC | PRN
  Administered 2020-07-18: 120 mg via INTRAVENOUS

## 2020-07-18 NOTE — Telephone Encounter (Signed)
Patient c/o Palpitations:  High priority if patient c/o lightheadedness, shortness of breath, or chest pain  1) How long have you had palpitations/irregular HR/ Afib? Are you having the symptoms now? Yes, has been in afib for the last hour  2) Are you currently experiencing lightheadedness, SOB or CP? no  3) Do you have a history of afib (atrial fibrillation) or irregular heart rhythm? no  4) Have you checked your BP or HR? (document readings if available): HR has been bouncing from 96, 68, 89, 91, 92, no BP readings  5) Are you experiencing any other symptoms? No    Patient states he had his cardioversion this morning and is back in afib. He states he has not eaten anything except a banana. He would like to know if this is normal.

## 2020-07-18 NOTE — Anesthesia Postprocedure Evaluation (Signed)
Anesthesia Post Note  Patient: Michael Choi  Procedure(s) Performed: CARDIOVERSION (N/A )     Patient location during evaluation: Endoscopy Anesthesia Type: General Level of consciousness: awake and alert Pain management: pain level controlled Vital Signs Assessment: post-procedure vital signs reviewed and stable Respiratory status: spontaneous breathing, nonlabored ventilation, respiratory function stable and patient connected to nasal cannula oxygen Cardiovascular status: blood pressure returned to baseline and stable Postop Assessment: no apparent nausea or vomiting Anesthetic complications: no   No complications documented.  Last Vitals:  Vitals:   07/18/20 0856  BP: (!) 124/98  Pulse: 71  Resp: (!) 21  Temp: 36.4 C  SpO2: 96%    Last Pain:  Vitals:   07/18/20 0856  TempSrc: Oral  PainSc: 0-No pain                 Trevor Iha

## 2020-07-18 NOTE — Anesthesia Procedure Notes (Signed)
Procedure Name: MAC Date/Time: 07/18/2020 9:03 AM Performed by: Kathryne Hitch, CRNA Pre-anesthesia Checklist: Patient identified, Emergency Drugs available, Patient being monitored and Suction available Patient Re-evaluated:Patient Re-evaluated prior to induction Oxygen Delivery Method: Ambu bag Preoxygenation: Pre-oxygenation with 100% oxygen Induction Type: IV induction Ventilation: Mask ventilation without difficulty Placement Confirmation: positive ETCO2 Dental Injury: Teeth and Oropharynx as per pre-operative assessment

## 2020-07-18 NOTE — Interval H&P Note (Signed)
History and Physical Interval Note:  07/18/2020 9:02 AM  Michael Choi  has presented today for surgery, with the diagnosis of AFIB.  The various methods of treatment have been discussed with the patient and family. After consideration of risks, benefits and other options for treatment, the patient has consented to  Procedure(s): CARDIOVERSION (N/A) as a surgical intervention.  The patient's history has been reviewed, patient examined, no change in status, stable for surgery.  I have reviewed the patient's chart and labs.  Questions were answered to the patient's satisfaction.     [employer]

## 2020-07-18 NOTE — Transfer of Care (Signed)
Immediate Anesthesia Transfer of Care Note  Patient: LOGIN MUCKLEROY  Procedure(s) Performed: CARDIOVERSION (N/A )  Patient Location: Endoscopy Unit  Anesthesia Type:General  Level of Consciousness: drowsy and patient cooperative  Airway & Oxygen Therapy: Patient Spontanous Breathing and Patient connected to face mask oxygen  Post-op Assessment: Report given to RN and Post -op Vital signs reviewed and stable  Post vital signs: Reviewed and stable  Last Vitals:  Vitals Value Taken Time  BP 136/86   Temp    Pulse 67 07/18/20 0916  Resp 23 07/18/20 0916  SpO2 99 % 07/18/20 0916  Vitals shown include unvalidated device data.  Last Pain:  Vitals:   07/18/20 0856  TempSrc: Oral  PainSc: 0-No pain         Complications: No complications documented.

## 2020-07-18 NOTE — Telephone Encounter (Signed)
Spoke with pt who states he had his cardioversion today and is now back in Afib.  Pt denies current CP, SOB, dizziness, or faintness at this time.  Pt states his insurance will not cover visit for Afib clinic.  Will forward information to Francis Dowse, PA-C for further review and recommendation as Dr Graciela Husbands is currently out of the office.  Pt verbalizes understanding and agrees with current plan.

## 2020-07-18 NOTE — CV Procedure (Signed)
Electrical Cardioversion Procedure Note BERRY REILY [id number] 05/12/1961  Procedure: Electrical Cardioversion Indications:  Atrial Fibrillation  Time Out: Verified patient identification, verified procedure,medications/allergies/relevent history reviewed, required imaging and test results available.  Performed  Procedure Details  The patient was NPO after midnight. Anesthesia was administered at the beside  by Dr.Houser with 120mg  of propofol.  Cardioversion was performed with synchronized biphasic defibrillation via AP pads with 200 joules.  1 attempt(s) were performed.  The patient converted to normal sinus rhythm. The patient tolerated the procedure well   IMPRESSION:  Successful cardioversion of atrial fibrillation. Post conversion pause >> junctional >> sinus rhythm   Donato Schultz 07/18/2020, 9:10 AM

## 2020-07-19 NOTE — Telephone Encounter (Signed)
Spoke with pt and advised of instructions per Francis Dowse, PA-C as below.  Pt has echo scheduled for 08/10/2020.  Appointment moved to 08/10/2020 with Dr Johney Frame to discuss Afib ablation.  Pt advised of importance of not missing any doses of his blood thinner, Eliquis.  Reviewed ED precautions.  Pt verbalizes understanding and agrees with current plan.  Sheilah Pigeon, PA-C  You 1 hour ago (8:52 AM)     Dr. Graciela Husbands wanted an updated echo, please make sure this is completed/scheduled for prior to his MD visits.  This will help decide management strategies.  Remind him not to miss any doses of his [facility]   Message text    Sheilah Pigeon, PA-C  You 1 hour ago (8:51 AM)     Since he is feeling well and HR is controlled, I would see if you can 1st move up his apt with Dr. Johney Frame, to discuss ablation.  Looks like he has an appt to see SK in a month. If feeling OK, this could work.

## 2020-07-27 ENCOUNTER — Telehealth: Payer: Self-pay | Admitting: Internal Medicine

## 2020-07-27 NOTE — Telephone Encounter (Signed)
Patient called and said he got a bill from his procedure that was done 07/18/20. The patient said his insurance said that our office did not have a claim filed for the procedure.  Please assist patient

## 2020-07-30 NOTE — Telephone Encounter (Signed)
Talked with patient this AM regarding the claims for his cardioversion.  Conversation detailed in account notes in transaction inquiry.

## 2020-08-10 ENCOUNTER — Encounter: Payer: Self-pay | Admitting: *Deleted

## 2020-08-10 ENCOUNTER — Other Ambulatory Visit: Payer: Self-pay

## 2020-08-10 ENCOUNTER — Ambulatory Visit (INDEPENDENT_AMBULATORY_CARE_PROVIDER_SITE_OTHER): Payer: 59 | Admitting: Internal Medicine

## 2020-08-10 ENCOUNTER — Encounter: Payer: Self-pay | Admitting: Internal Medicine

## 2020-08-10 ENCOUNTER — Ambulatory Visit (HOSPITAL_COMMUNITY): Payer: PRIVATE HEALTH INSURANCE | Attending: Cardiology

## 2020-08-10 VITALS — BP 112/76 | HR 74 | Ht 72.0 in | Wt 230.0 lb

## 2020-08-10 DIAGNOSIS — I4891 Unspecified atrial fibrillation: Secondary | ICD-10-CM

## 2020-08-10 DIAGNOSIS — I4892 Unspecified atrial flutter: Secondary | ICD-10-CM | POA: Diagnosis not present

## 2020-08-10 DIAGNOSIS — I4819 Other persistent atrial fibrillation: Secondary | ICD-10-CM | POA: Diagnosis not present

## 2020-08-10 DIAGNOSIS — D6869 Other thrombophilia: Secondary | ICD-10-CM | POA: Diagnosis not present

## 2020-08-10 DIAGNOSIS — I10 Essential (primary) hypertension: Secondary | ICD-10-CM | POA: Diagnosis not present

## 2020-08-10 LAB — CBC WITH DIFFERENTIAL/PLATELET
Basophils Absolute: 0 10*3/uL (ref 0.0–0.2)
Basos: 0 %
EOS (ABSOLUTE): 0.1 10*3/uL (ref 0.0–0.4)
Eos: 1 %
Hematocrit: 52.4 % — ABNORMAL HIGH (ref 37.5–51.0)
Hemoglobin: 17.9 g/dL — ABNORMAL HIGH (ref 13.0–17.7)
Immature Grans (Abs): 0 10*3/uL (ref 0.0–0.1)
Immature Granulocytes: 0 %
Lymphocytes Absolute: 2 10*3/uL (ref 0.7–3.1)
Lymphs: 28 %
MCH: 31.9 pg (ref 26.6–33.0)
MCHC: 34.2 g/dL (ref 31.5–35.7)
MCV: 93 fL (ref 79–97)
Monocytes Absolute: 1 10*3/uL — ABNORMAL HIGH (ref 0.1–0.9)
Monocytes: 15 %
Neutrophils Absolute: 3.9 10*3/uL (ref 1.4–7.0)
Neutrophils: 56 %
Platelets: 244 10*3/uL (ref 150–450)
RBC: 5.62 x10E6/uL (ref 4.14–5.80)
RDW: 13.2 % (ref 11.6–15.4)
WBC: 7 10*3/uL (ref 3.4–10.8)

## 2020-08-10 LAB — BASIC METABOLIC PANEL
BUN/Creatinine Ratio: 12 (ref 10–24)
BUN: 17 mg/dL (ref 8–27)
CO2: 26 mmol/L (ref 20–29)
Calcium: 9.7 mg/dL (ref 8.6–10.2)
Chloride: 101 mmol/L (ref 96–106)
Creatinine, Ser: 1.45 mg/dL — ABNORMAL HIGH (ref 0.76–1.27)
GFR calc Af Amer: 60 mL/min/{1.73_m2} (ref >59)
GFR calc non Af Amer: 52 mL/min/{1.73_m2} — ABNORMAL LOW (ref >59)
Glucose: 93 mg/dL (ref 65–99)
Potassium: 4.8 mmol/L (ref 3.5–5.2)
Sodium: 139 mmol/L (ref 134–144)

## 2020-08-10 LAB — ECHOCARDIOGRAM COMPLETE
Area-P 1/2: 2.91 cm2
S' Lateral: 3.95 cm

## 2020-08-10 NOTE — Progress Notes (Signed)
Electrophysiology Office Note   Date:  08/10/2020   ID:  Michael Choi, DOB 04-Oct-1960, MRN [id number]  PCP:  Carolynn Serve, MD   Primary Electrophysiologist: Dr Graciela Husbands  CC: afib   History of Present Illness: Michael Choi is a 60 y.o. male who presents today for electrophysiology evaluation.   He presents for further discussion of afib treatment options. He had atrial flutter diagnosed in 2019.  He underwent CTI ablation by Dr Graciela Husbands at that time, without difficulty.  He did very well post ablation but subsequently developed atrial fibrillation.  afib has increased in frequency and duration since that time. He has required cardioversion on 3 occasions.  He had his most recent cardioversion in December and has already returned to afib. In afib, he reports symptoms of fatigue and decreased exercise tolerance.  Today, he denies symptoms of palpitations, chest pain, shortness of breath, orthopnea, PND, lower extremity edema, claudication, dizziness, presyncope, syncope, bleeding, or neurologic sequela. The patient is tolerating medications without difficulties and is otherwise without complaint today.    Past Medical History:  Diagnosis Date  . A-fib (HCC)   . Atrial flutter (HCC)   . Atypical nevus 04/16/2016   left outer back (mild)   . Hypertension   . Melanoma [facility])    Past Surgical History:  Procedure Laterality Date  . A-FLUTTER ABLATION N/A 03/05/2018   Procedure: A-FLUTTER ABLATION;  Surgeon: Duke Salvia, MD;  Location[facility] INVASIVE CV LAB;  Service: Cardiovascular;  Laterality: N/A;  . CARDIOVERSION N/A 11/27/2017   Procedure: CARDIOVERSION;  Surgeon: Elder Negus, MD;  Location: [facility] ENDOSCOPY;  Service: Cardiovascular;  Laterality: N/A;  . CARDIOVERSION N/A 03/18/2019   Procedure: CARDIOVERSION;  Surgeon: Lewayne Bunting, MD;  Location: [facility] ENDOSCOPY;  Service: Cardiovascular;  Laterality: N/A;  . CARDIOVERSION N/A 07/18/2020   Procedure: CARDIOVERSION;  Surgeon:  Jake Bathe, MD;  Location: [facility] ENDOSCOPY;  Service: Cardiovascular;  Laterality: N/A;  . KNEE ARTHROSCOPY    . TEE WITHOUT CARDIOVERSION N/A 11/27/2017   Procedure: TRANSESOPHAGEAL ECHOCARDIOGRAM (TEE);  Surgeon: Elder Negus, MD;  Location: [facility] ENDOSCOPY;  Service: Cardiovascular;  Laterality: N/A;  . TEE WITHOUT CARDIOVERSION N/A 03/18/2019   Procedure: TRANSESOPHAGEAL ECHOCARDIOGRAM (TEE);  Surgeon: Lewayne Bunting, MD;  Location: [facility] ENDOSCOPY;  Service: Cardiovascular;  Laterality: N/A;     Current Outpatient Medications  Medication Sig Dispense Refill  . ALPRAZolam (XANAX) 0.5 MG tablet Take 0.5 tablets by mouth daily as needed for anxiety.    Marland Kitchen amLODipine (NORVASC) 5 MG tablet Take 5 mg by mouth at bedtime.   3  . losartan (COZAAR) 50 MG tablet Take 50 mg by mouth daily.  3  . MAGNESIUM PO Take 100 mg by mouth daily with breakfast.    . metoprolol tartrate (LOPRESSOR) 25 MG tablet TAKE 1 TABLET BY MOUTH 2 TIMES DAILY. 180 tablet 2  . multivitamin (THERAGRAN) per tablet Take 1 tablet by mouth daily.    . naproxen sodium (ALEVE) 220 MG tablet Take 220-440 mg by mouth 2 (two) times daily as needed (for pain or headaches).    . Omega-3 Fatty Acids (FISH OIL) 1000 MG CAPS Take 1,000 mg by mouth daily.    Marland Kitchen testosterone cypionate (DEPOTESTOSTERONE CYPIONATE) 200 MG/ML injection Inject 150 mg into the muscle once a week. .75 ml    . apixaban (ELIQUIS) 5 MG TABS tablet Take 1 tablet (5 mg total) by mouth 2 (two) times daily. 60 tablet 3   No  current facility-administered medications for this visit.    Allergies:   Doxycycline, Tetracycline, and Latex   Social History:  The patient  reports that he has never smoked. He has never used smokeless tobacco. He reports current alcohol use of about 1.0 standard drink of alcohol per week. He reports that he does not use drugs.   Family History:  The patient's  family history includes Cancer in his mother; Heart disease in his father;  Hypertension in his brother.    ROS:  Please see the history of present illness.   All other systems are personally reviewed and negative.    PHYSICAL EXAM: VS:  BP 112/76   Pulse 74   Ht 6' (1.829 m)   Wt 230 lb (104.3 kg)   SpO2 95%   BMI 31.19 kg/m  , BMI Body mass index is 31.19 kg/m. GEN: Well nourished, well developed, in no acute distress HEENT: normal Neck: no JVD, carotid bruits, or masses Cardiac: iRRR; no murmurs, rubs, or gallops,no edema  Respiratory:  clear to auscultation bilaterally, normal work of breathing GI: soft, nontender, nondistended, + BS MS: no deformity or atrophy Skin: warm and dry  Neuro:  Strength and sensation are intact Psych: euthymic mood, full affect  EKG:  EKG is ordered today. The ekg ordered today is personally reviewed and shows afib   Recent Labs: 10/05/2019: ALT 72 07/11/2020: BUN 13; Creatinine, Ser 1.41; Hemoglobin 18.0; Platelets 277; Potassium 4.8; Sodium 140  personally reviewed   Lipid Panel     Component Value Date/Time   CHOL 158 05/19/2018 0927   TRIG 71 05/19/2018 0927   HDL 44 05/19/2018 0927   CHOLHDL 3.6 05/19/2018 0927   CHOLHDL 3.4 11/27/2017 1036   VLDL 13 11/27/2017 1036   LDLCALC 100 (H) 05/19/2018 0927   LDLDIRECT 132.3 (H) 10/05/2019 1531   personally reviewed   Wt Readings from Last 3 Encounters:  08/10/20 230 lb (104.3 kg)  07/11/20 242 lb 3.2 oz (109.9 kg)  06/03/20 240 lb (108.9 kg)      Other studies personally reviewed: Additional studies/ records that were reviewed today include: Dr Koren Bound notes, prior TEE  Review of the above records today demonstrates: as above   ASSESSMENT AND PLAN:  1.  Persistent afib The patient has symptomatic, recurrent persistent atrial fibrillation. he is s/p CTI ablation previously by Dr Graciela Husbands Chads2vasc score is 2.  he is anticoagulated with eliquis . Therapeutic strategies for afib including medicine and ablation were discussed in detail with the patient  today. Risk, benefits, and alternatives to EP study and radiofrequency ablation for afib were also discussed in detail today. These risks include but are not limited to stroke, bleeding, vascular damage, tamponade, perforation, damage to the esophagus, lungs, and other structures, pulmonary vein stenosis, worsening renal function, and death. The patient understands these risk and wishes to proceed.  We will therefore proceed with catheter ablation at the next available time.  Carto, ICE, anesthesia are requested for the procedure.  Will also obtain cardiac CT prior to the procedure to exclude LAA thrombus and further evaluate atrial anatomy.   Risks, benefits and potential toxicities for medications prescribed and/or refilled reviewed with patient today.   2. Nonischemic CM Previously EF 45% Echo today is pending Will do better with sinus rhythm  3. HTN Stable No change required today   Signed, Hillis Range, MD  08/10/2020 9:59 AM     [facility] [address] [place] [address] (  [phone] (office) [phone] (fax)

## 2020-08-10 NOTE — Patient Instructions (Signed)
Medication Instructions:  none *If you need a refill on your cardiac medications before your next appointment, please call your pharmacy*   Lab Work: CBC, BMP  If you have labs (blood work) drawn today and your tests are completely normal, you will receive your results only by:  [place] (if you have MyChart) OR  A paper copy in the mail If you have any lab test that is abnormal or we need to change your treatment, we will call you to review the results.   Testing/Procedures: Your physician has requested that you have cardiac CT. Cardiac computed tomography (CT) is a painless test that uses an x-ray machine to take clear, detailed pictures of your heart. For further information please visit [email or web address]. Please follow instruction sheet as given.  Follow-Up: At [facility], you and your health needs are our priority.  As part of our continuing mission to provide you with exceptional heart care, we have created designated Provider Care Teams.  These Care Teams include your primary Cardiologist (physician) and Advanced Practice Providers (APPs -  Physician Assistants and Nurse Practitioners) who all work together to provide you with the care you need, when you need it.  We recommend signing up for the patient portal called "MyChart".  Sign up information is provided on this After Visit Summary.  MyChart is used to connect with patients for Virtual Visits (Telemedicine).  Patients are able to view lab/test results, encounter notes, upcoming appointments, etc.  Non-urgent messages can be sent to your provider as well.   To learn more about what you can do with MyChart, go to [email or web address].      Other Instructions  Cardiac Ablation Cardiac ablation is a procedure to destroy (ablate) some heart tissue that is sending bad signals. These bad signals cause problems in heart rhythm. The heart has many areas that make these signals. If there are problems in these areas,  they can make the heart beat in a way that is not normal. Destroying some tissues can help make the heart rhythm normal. Tell your doctor about:  Any allergies you have.  All medicines you are taking. These include vitamins, herbs, eye drops, creams, and over-the-counter medicines.  Any problems you or family members have had with medicines that make you fall asleep (anesthetics).  Any blood disorders you have.  Any surgeries you have had.  Any medical conditions you have, such as kidney failure.  Whether you are pregnant or may be pregnant. What are the risks? This is a safe procedure. But problems may occur, including:  Infection.  Bruising and bleeding.  Bleeding into the chest.  Stroke or blood clots.  Damage to nearby areas of your body.  Allergies to medicines or dyes.  The need for a pacemaker if the normal system is damaged.  Failure of the procedure to treat the problem. What happens before the procedure? Medicines Ask your doctor about:  Changing or stopping your normal medicines. This is important.  Taking aspirin and ibuprofen. Do not take these medicines unless your doctor tells you to take them.  Taking other medicines, vitamins, herbs, and supplements. General instructions  Follow instructions from your doctor about what you cannot eat or drink.  Plan to have someone take you home from the hospital or clinic.  If you will be going home right after the procedure, plan to have someone with you for 24 hours.  Ask your doctor what steps will be taken to prevent infection. What happens during the procedure?  An IV tube will be put into one of your veins.  You will be given a medicine to help you relax.  The skin on your neck or groin will be numbed.  A cut (incision) will be made in your neck or groin. A needle will be put through your cut and into a large vein.  A tube (catheter) will be put into the needle. The tube will be moved to your  heart.  Dye may be put through the tube. This helps your doctor see your heart.  Small devices (electrodes) on the tube will send out signals.  A type of energy will be used to destroy some heart tissue.  The tube will be taken out.  Pressure will be held on your cut. This helps stop bleeding.  A bandage will be put over your cut. The exact procedure may vary among doctors and hospitals.   What happens after the procedure?  You will be watched until you leave the hospital or clinic. This includes checking your heart rate, breathing rate, oxygen, and blood pressure.  Your cut will be watched for bleeding. You will need to lie still for a few hours.  Do not drive for 24 hours or as long as your doctor tells you. Summary  Cardiac ablation is a procedure to destroy some heart tissue. This is done to treat heart rhythm problems.  Tell your doctor about any medical conditions you may have. Tell him or her about all medicines you are taking to treat them.  This is a safe procedure. But problems may occur. These include infection, bruising, bleeding, and damage to nearby areas of your body.  Follow what your doctor tells you about food and drink. You may also be told to change or stop some of your medicines.  After the procedure, do not drive for 24 hours or as long as your doctor tells you. This information is not intended to replace advice given to you by your health care provider. Make sure you discuss any questions you have with your health care provider. Document Revised: 06/09/2019 Document Reviewed: 06/09/2019 Elsevier Patient Education  2021 [employer].

## 2020-08-10 NOTE — H&P (View-Only) (Signed)
Electrophysiology Office Note   Date:  08/10/2020   ID:  Michael Choi, DOB 04-Oct-1960, MRN [id number]  PCP:  Carolynn Serve, MD   Primary Electrophysiologist: Dr Graciela Husbands  CC: afib   History of Present Illness: Michael Choi is a 60 y.o. male who presents today for electrophysiology evaluation.   He presents for further discussion of afib treatment options. He had atrial flutter diagnosed in 2019.  He underwent CTI ablation by Dr Graciela Husbands at that time, without difficulty.  He did very well post ablation but subsequently developed atrial fibrillation.  afib has increased in frequency and duration since that time. He has required cardioversion on 3 occasions.  He had his most recent cardioversion in December and has already returned to afib. In afib, he reports symptoms of fatigue and decreased exercise tolerance.  Today, he denies symptoms of palpitations, chest pain, shortness of breath, orthopnea, PND, lower extremity edema, claudication, dizziness, presyncope, syncope, bleeding, or neurologic sequela. The patient is tolerating medications without difficulties and is otherwise without complaint today.    Past Medical History:  Diagnosis Date  . A-fib (HCC)   . Atrial flutter (HCC)   . Atypical nevus 04/16/2016   left outer back (mild)   . Hypertension   . Melanoma [facility])    Past Surgical History:  Procedure Laterality Date  . A-FLUTTER ABLATION N/A 03/05/2018   Procedure: A-FLUTTER ABLATION;  Surgeon: Duke Salvia, MD;  Location[facility] INVASIVE CV LAB;  Service: Cardiovascular;  Laterality: N/A;  . CARDIOVERSION N/A 11/27/2017   Procedure: CARDIOVERSION;  Surgeon: Elder Negus, MD;  Location: [facility] ENDOSCOPY;  Service: Cardiovascular;  Laterality: N/A;  . CARDIOVERSION N/A 03/18/2019   Procedure: CARDIOVERSION;  Surgeon: Lewayne Bunting, MD;  Location: [facility] ENDOSCOPY;  Service: Cardiovascular;  Laterality: N/A;  . CARDIOVERSION N/A 07/18/2020   Procedure: CARDIOVERSION;  Surgeon:  Jake Bathe, MD;  Location: [facility] ENDOSCOPY;  Service: Cardiovascular;  Laterality: N/A;  . KNEE ARTHROSCOPY    . TEE WITHOUT CARDIOVERSION N/A 11/27/2017   Procedure: TRANSESOPHAGEAL ECHOCARDIOGRAM (TEE);  Surgeon: Elder Negus, MD;  Location: [facility] ENDOSCOPY;  Service: Cardiovascular;  Laterality: N/A;  . TEE WITHOUT CARDIOVERSION N/A 03/18/2019   Procedure: TRANSESOPHAGEAL ECHOCARDIOGRAM (TEE);  Surgeon: Lewayne Bunting, MD;  Location: [facility] ENDOSCOPY;  Service: Cardiovascular;  Laterality: N/A;     Current Outpatient Medications  Medication Sig Dispense Refill  . ALPRAZolam (XANAX) 0.5 MG tablet Take 0.5 tablets by mouth daily as needed for anxiety.    Marland Kitchen amLODipine (NORVASC) 5 MG tablet Take 5 mg by mouth at bedtime.   3  . losartan (COZAAR) 50 MG tablet Take 50 mg by mouth daily.  3  . MAGNESIUM PO Take 100 mg by mouth daily with breakfast.    . metoprolol tartrate (LOPRESSOR) 25 MG tablet TAKE 1 TABLET BY MOUTH 2 TIMES DAILY. 180 tablet 2  . multivitamin (THERAGRAN) per tablet Take 1 tablet by mouth daily.    . naproxen sodium (ALEVE) 220 MG tablet Take 220-440 mg by mouth 2 (two) times daily as needed (for pain or headaches).    . Omega-3 Fatty Acids (FISH OIL) 1000 MG CAPS Take 1,000 mg by mouth daily.    Marland Kitchen testosterone cypionate (DEPOTESTOSTERONE CYPIONATE) 200 MG/ML injection Inject 150 mg into the muscle once a week. .75 ml    . apixaban (ELIQUIS) 5 MG TABS tablet Take 1 tablet (5 mg total) by mouth 2 (two) times daily. 60 tablet 3   No  current facility-administered medications for this visit.    Allergies:   Doxycycline, Tetracycline, and Latex   Social History:  The patient  reports that he has never smoked. He has never used smokeless tobacco. He reports current alcohol use of about 1.0 standard drink of alcohol per week. He reports that he does not use drugs.   Family History:  The patient's  family history includes Cancer in his mother; Heart disease in his father;  Hypertension in his brother.    ROS:  Please see the history of present illness.   All other systems are personally reviewed and negative.    PHYSICAL EXAM: VS:  BP 112/76   Pulse 74   Ht 6' (1.829 m)   Wt 230 lb (104.3 kg)   SpO2 95%   BMI 31.19 kg/m  , BMI Body mass index is 31.19 kg/m. GEN: Well nourished, well developed, in no acute distress HEENT: normal Neck: no JVD, carotid bruits, or masses Cardiac: iRRR; no murmurs, rubs, or gallops,no edema  Respiratory:  clear to auscultation bilaterally, normal work of breathing GI: soft, nontender, nondistended, + BS MS: no deformity or atrophy Skin: warm and dry  Neuro:  Strength and sensation are intact Psych: euthymic mood, full affect  EKG:  EKG is ordered today. The ekg ordered today is personally reviewed and shows afib   Recent Labs: 10/05/2019: ALT 72 07/11/2020: BUN 13; Creatinine, Ser 1.41; Hemoglobin 18.0; Platelets 277; Potassium 4.8; Sodium 140  personally reviewed   Lipid Panel     Component Value Date/Time   CHOL 158 05/19/2018 0927   TRIG 71 05/19/2018 0927   HDL 44 05/19/2018 0927   CHOLHDL 3.6 05/19/2018 0927   CHOLHDL 3.4 11/27/2017 1036   VLDL 13 11/27/2017 1036   LDLCALC 100 (H) 05/19/2018 0927   LDLDIRECT 132.3 (H) 10/05/2019 1531   personally reviewed   Wt Readings from Last 3 Encounters:  08/10/20 230 lb (104.3 kg)  07/11/20 242 lb 3.2 oz (109.9 kg)  06/03/20 240 lb (108.9 kg)      Other studies personally reviewed: Additional studies/ records that were reviewed today include: Dr Koren Bound notes, prior TEE  Review of the above records today demonstrates: as above   ASSESSMENT AND PLAN:  1.  Persistent afib The patient has symptomatic, recurrent persistent atrial fibrillation. he is s/p CTI ablation previously by Dr Graciela Husbands Chads2vasc score is 2.  he is anticoagulated with eliquis . Therapeutic strategies for afib including medicine and ablation were discussed in detail with the patient  today. Risk, benefits, and alternatives to EP study and radiofrequency ablation for afib were also discussed in detail today. These risks include but are not limited to stroke, bleeding, vascular damage, tamponade, perforation, damage to the esophagus, lungs, and other structures, pulmonary vein stenosis, worsening renal function, and death. The patient understands these risk and wishes to proceed.  We will therefore proceed with catheter ablation at the next available time.  Carto, ICE, anesthesia are requested for the procedure.  Will also obtain cardiac CT prior to the procedure to exclude LAA thrombus and further evaluate atrial anatomy.   Risks, benefits and potential toxicities for medications prescribed and/or refilled reviewed with patient today.   2. Nonischemic CM Previously EF 45% Echo today is pending Will do better with sinus rhythm  3. HTN Stable No change required today   Signed, Hillis Range, MD  08/10/2020 9:59 AM     [facility] [address] [place] [address] (  [phone] (office) [phone] (fax)

## 2020-08-16 ENCOUNTER — Telehealth (HOSPITAL_COMMUNITY): Payer: Self-pay | Admitting: *Deleted

## 2020-08-16 ENCOUNTER — Encounter: Payer: Self-pay | Admitting: Internal Medicine

## 2020-08-16 NOTE — Telephone Encounter (Signed)
Reaching out to patient to offer assistance regarding upcoming cardiac imaging study; pt verbalizes understanding of appt date/time, parking situation and where to check in, pre-test NPO status, and verified current allergies; name and call back number provided for further questions should they arise  Gordy Clement RN Navigator Cardiac [place] [facility] [phone] office [phone] cell

## 2020-08-17 ENCOUNTER — Ambulatory Visit (HOSPITAL_COMMUNITY)
Admission: RE | Admit: 2020-08-17 | Discharge: 2020-08-17 | Disposition: A | Discharge status: Home / Self Care | Payer: 59 | Source: Ambulatory Visit | Attending: Internal Medicine | Admitting: Internal Medicine

## 2020-08-17 ENCOUNTER — Encounter (HOSPITAL_COMMUNITY): Payer: Self-pay

## 2020-08-17 ENCOUNTER — Other Ambulatory Visit: Payer: Self-pay

## 2020-08-17 DIAGNOSIS — I4891 Unspecified atrial fibrillation: Secondary | ICD-10-CM | POA: Insufficient documentation

## 2020-08-17 MED ORDER — IOHEXOL 350 MG/ML SOLN
80.0000 mL | Freq: Once | INTRAVENOUS | Status: AC | PRN
  Administered 2020-08-17: 80 mL via INTRAVENOUS

## 2020-08-23 ENCOUNTER — Ambulatory Visit (HOSPITAL_COMMUNITY): Payer: 59 | Admitting: Certified Registered"

## 2020-08-23 ENCOUNTER — Encounter (HOSPITAL_COMMUNITY)
Admission: RE | Disposition: A | Discharge status: Home / Self Care | Payer: Self-pay | Source: Home / Self Care | Attending: Internal Medicine

## 2020-08-23 ENCOUNTER — Other Ambulatory Visit: Payer: Self-pay

## 2020-08-23 ENCOUNTER — Ambulatory Visit (HOSPITAL_COMMUNITY)
Admission: RE | Admit: 2020-08-23 | Discharge: 2020-08-23 | Disposition: A | Discharge status: Home / Self Care | Payer: 59 | Attending: Internal Medicine | Admitting: Internal Medicine

## 2020-08-23 ENCOUNTER — Telehealth: Payer: Self-pay

## 2020-08-23 DIAGNOSIS — Z7901 Long term (current) use of anticoagulants: Secondary | ICD-10-CM | POA: Insufficient documentation

## 2020-08-23 DIAGNOSIS — Z8249 Family history of ischemic heart disease and other diseases of the circulatory system: Secondary | ICD-10-CM | POA: Diagnosis not present

## 2020-08-23 DIAGNOSIS — Z85820 Personal history of malignant melanoma of skin: Secondary | ICD-10-CM | POA: Diagnosis not present

## 2020-08-23 DIAGNOSIS — I4819 Other persistent atrial fibrillation: Secondary | ICD-10-CM | POA: Insufficient documentation

## 2020-08-23 DIAGNOSIS — I10 Essential (primary) hypertension: Secondary | ICD-10-CM | POA: Diagnosis not present

## 2020-08-23 DIAGNOSIS — Z91040 Latex allergy status: Secondary | ICD-10-CM | POA: Insufficient documentation

## 2020-08-23 DIAGNOSIS — Z79899 Other long term (current) drug therapy: Secondary | ICD-10-CM | POA: Insufficient documentation

## 2020-08-23 DIAGNOSIS — I428 Other cardiomyopathies: Secondary | ICD-10-CM | POA: Diagnosis not present

## 2020-08-23 DIAGNOSIS — Z881 Allergy status to other antibiotic agents status: Secondary | ICD-10-CM | POA: Diagnosis not present

## 2020-08-23 DIAGNOSIS — I4892 Unspecified atrial flutter: Secondary | ICD-10-CM | POA: Diagnosis not present

## 2020-08-23 HISTORY — PX: ATRIAL FIBRILLATION ABLATION: EP1191

## 2020-08-23 LAB — POCT ACTIVATED CLOTTING TIME
Activated Clotting Time: 273 seconds
Activated Clotting Time: 309 seconds

## 2020-08-23 SURGERY — ATRIAL FIBRILLATION ABLATION
Anesthesia: General

## 2020-08-23 MED ORDER — ONDANSETRON HCL 4 MG/2ML IJ SOLN
INTRAMUSCULAR | Status: DC | PRN
  Administered 2020-08-23: 4 mg via INTRAVENOUS

## 2020-08-23 MED ORDER — HEPARIN SODIUM (PORCINE) 1000 UNIT/ML IJ SOLN
INTRAMUSCULAR | Status: DC | PRN
  Administered 2020-08-23: 15000 [IU] via INTRAVENOUS
  Administered 2020-08-23: 2000 [IU] via INTRAVENOUS
  Administered 2020-08-23: 4000 [IU] via INTRAVENOUS

## 2020-08-23 MED ORDER — SODIUM CHLORIDE 0.9 % IV SOLN: 250.0000 mL | INTRAVENOUS | Status: DC | PRN

## 2020-08-23 MED ORDER — FENTANYL CITRATE (PF) 250 MCG/5ML IJ SOLN
INTRAMUSCULAR | Status: DC | PRN
  Administered 2020-08-23: 100 ug via INTRAVENOUS

## 2020-08-23 MED ORDER — SODIUM CHLORIDE 0.9 % IV SOLN: INTRAVENOUS | Status: DC

## 2020-08-23 MED ORDER — LIDOCAINE 2% (20 MG/ML) 5 ML SYRINGE
INTRAMUSCULAR | Status: DC | PRN
  Administered 2020-08-23: 100 mg via INTRAVENOUS

## 2020-08-23 MED ORDER — HYDROCODONE-ACETAMINOPHEN 5-325 MG PO TABS: 1.0000 | ORAL_TABLET | ORAL | Status: DC | PRN

## 2020-08-23 MED ORDER — ACETAMINOPHEN 325 MG PO TABS: 650.0000 mg | ORAL_TABLET | ORAL | Status: DC | PRN

## 2020-08-23 MED ORDER — DEXAMETHASONE SODIUM PHOSPHATE 10 MG/ML IJ SOLN
INTRAMUSCULAR | Status: DC | PRN
  Administered 2020-08-23: 10 mg via INTRAVENOUS

## 2020-08-23 MED ORDER — APIXABAN 5 MG PO TABS
5.0000 mg | ORAL_TABLET | Freq: Once | ORAL | Status: AC
  Administered 2020-08-23: 5 mg via ORAL
  Filled 2020-08-23: qty 1

## 2020-08-23 MED ORDER — SODIUM CHLORIDE 0.9% FLUSH: 3.0000 mL | INTRAVENOUS | Status: DC | PRN

## 2020-08-23 MED ORDER — PROTAMINE SULFATE 10 MG/ML IV SOLN
INTRAVENOUS | Status: DC | PRN
  Administered 2020-08-23: 40 mg via INTRAVENOUS

## 2020-08-23 MED ORDER — HEPARIN (PORCINE) IN NACL 1000-0.9 UT/500ML-% IV SOLN
INTRAVENOUS | Status: AC
  Filled 2020-08-23: qty 500

## 2020-08-23 MED ORDER — ROCURONIUM BROMIDE 10 MG/ML (PF) SYRINGE
PREFILLED_SYRINGE | INTRAVENOUS | Status: DC | PRN
  Administered 2020-08-23: 20 mg via INTRAVENOUS
  Administered 2020-08-23: 70 mg via INTRAVENOUS

## 2020-08-23 MED ORDER — HEPARIN SODIUM (PORCINE) 1000 UNIT/ML IJ SOLN
INTRAMUSCULAR | Status: AC
  Filled 2020-08-23: qty 2

## 2020-08-23 MED ORDER — SUGAMMADEX SODIUM 200 MG/2ML IV SOLN
INTRAVENOUS | Status: DC | PRN
  Administered 2020-08-23: 300 mg via INTRAVENOUS

## 2020-08-23 MED ORDER — ONDANSETRON HCL 4 MG/2ML IJ SOLN: 4.0000 mg | Freq: Four times a day (QID) | INTRAMUSCULAR | Status: DC | PRN

## 2020-08-23 MED ORDER — HEPARIN (PORCINE) IN NACL 1000-0.9 UT/500ML-% IV SOLN
INTRAVENOUS | Status: DC | PRN
  Administered 2020-08-23: 500 mL

## 2020-08-23 MED ORDER — MIDAZOLAM HCL 5 MG/5ML IJ SOLN
INTRAMUSCULAR | Status: DC | PRN
  Administered 2020-08-23: 2 mg via INTRAVENOUS

## 2020-08-23 MED ORDER — SODIUM CHLORIDE 0.9% FLUSH: 3.0000 mL | Freq: Two times a day (BID) | INTRAVENOUS | Status: DC

## 2020-08-23 MED ORDER — PHENYLEPHRINE HCL-NACL 10-0.9 MG/250ML-% IV SOLN
INTRAVENOUS | Status: DC | PRN
  Administered 2020-08-23: 25 ug/min via INTRAVENOUS

## 2020-08-23 MED ORDER — PROPOFOL 10 MG/ML IV BOLUS
INTRAVENOUS | Status: DC | PRN
  Administered 2020-08-23: 200 mg via INTRAVENOUS

## 2020-08-23 MED ORDER — HEPARIN SODIUM (PORCINE) 1000 UNIT/ML IJ SOLN
INTRAMUSCULAR | Status: DC | PRN
  Administered 2020-08-23: 1000 [IU] via INTRAVENOUS

## 2020-08-23 MED ORDER — PANTOPRAZOLE SODIUM 40 MG PO TBEC: 40.0000 mg | DELAYED_RELEASE_TABLET | Freq: Every day | ORAL | 0 refills | Status: DC

## 2020-08-23 MED ORDER — EPHEDRINE SULFATE-NACL 50-0.9 MG/10ML-% IV SOSY
PREFILLED_SYRINGE | INTRAVENOUS | Status: DC | PRN
  Administered 2020-08-23: 10 mg via INTRAVENOUS

## 2020-08-23 SURGICAL SUPPLY — 21 items
BLANKET WARM UNDERBOD FULL ACC (MISCELLANEOUS) ×2 IMPLANT
CATH 8FR REPROCESSED SOUNDSTAR (CATHETERS) ×2 IMPLANT
CATH 8FR SOUNDSTAR REPROCESSED (CATHETERS) IMPLANT
CATH MAPPNG PENTARAY F 2-6-2MM (CATHETERS) IMPLANT
CATH SMTCH THERMOCOOL SF DF (CATHETERS) ×1 IMPLANT
CATH WEB BI DIR CSDF CRV REPRO (CATHETERS) ×1 IMPLANT
CLOSURE PERCLOSE PROSTYLE (VASCULAR PRODUCTS) ×3 IMPLANT
COVER SWIFTLINK CONNECTOR (BAG) ×2 IMPLANT
MAT PREVALON FULL STRYKER (MISCELLANEOUS) ×1 IMPLANT
NDL BAYLIS TRANSSEPTAL 71CM (NEEDLE) IMPLANT
NEEDLE BAYLIS TRANSSEPTAL 71CM (NEEDLE) ×2 IMPLANT
PACK EP LATEX FREE (CUSTOM PROCEDURE TRAY) ×2
PACK EP LF (CUSTOM PROCEDURE TRAY) ×1 IMPLANT
PAD PRO RADIOLUCENT 2001M-C (PAD) ×2 IMPLANT
PATCH CARTO3 (PAD) ×2 IMPLANT
PENTARAY F 2-6-2MM (CATHETERS) ×2
SHEATH PINNACLE 7F 10CM (SHEATH) ×2 IMPLANT
SHEATH PINNACLE 9F 10CM (SHEATH) ×1 IMPLANT
SHEATH PROBE COVER 6X72 (BAG) ×1 IMPLANT
SHEATH SWARTZ TS SL2 63CM 8.5F (SHEATH) ×1 IMPLANT
TUBING SMART ABLATE COOLFLOW (TUBING) ×1 IMPLANT

## 2020-08-23 NOTE — Telephone Encounter (Signed)
-----   Message from [name], MD sent at 08/22/2020  8:13 PM EST ----- Please Inform Patient Echo showed   stable# heart muscle function about 40-45% with large LA enlargement

## 2020-08-23 NOTE — Interval H&P Note (Signed)
History and Physical Interval Note:  08/23/2020 10:30 AM  Michael Choi  has presented today for surgery, with the diagnosis of afib.  The various methods of treatment have been discussed with the patient and family. After consideration of risks, benefits and other options for treatment, the patient has consented to  Procedure(s): ATRIAL FIBRILLATION ABLATION (N/A) as a surgical intervention.  The patient's history has been reviewed, patient examined, no change in status, stable for surgery.  I have reviewed the patient's chart and labs.  Questions were answered to the patient's satisfaction.     Cardiac CT reviewed with him at length.  He reports compliance with eliquis without interruption.  Risk, benefits, and alternatives to EP study and radiofrequency ablation for afib were also discussed in detail today. These risks include but are not limited to stroke, bleeding, vascular damage, tamponade, perforation, damage to the esophagus, lungs, and other structures, pulmonary vein stenosis, worsening renal function, and death. The patient understands these risk and wishes to proceed.      [name]

## 2020-08-23 NOTE — Transfer of Care (Signed)
Immediate Anesthesia Transfer of Care Note  Patient: Michael Choi  Procedure(s) Performed: ATRIAL FIBRILLATION ABLATION (N/A )  Patient Location: Cath Lab  Anesthesia Type:General  Level of Consciousness: awake, alert  and oriented  Airway & Oxygen Therapy: Patient Spontanous Breathing and Patient connected to face mask oxygen  Post-op Assessment: Report given to RN, Post -op Vital signs reviewed and stable and Patient moving all extremities X 4  Post vital signs: Reviewed and stable  Last Vitals:  Vitals Value Taken Time  BP 115/74 08/23/20 1333  Temp 36.3 C 08/23/20 1333  Pulse 73 08/23/20 1337  Resp 14 08/23/20 1337  SpO2 98 % 08/23/20 1337  Vitals shown include unvalidated device data.  Last Pain:  Vitals:   08/23/20 1333  TempSrc: Temporal  PainSc: Asleep      Patients Stated Pain Goal: 2 (74/82/70 7867)  Complications: No complications documented.

## 2020-08-23 NOTE — Discharge Instructions (Signed)
Post procedure care instructions No driving for 4 days. No lifting over 5 lbs for 1 week. No vigorous or sexual activity for 1 week. You may return to work/your usual activities on 08/31/20. Keep procedure site clean & dry. If you notice increased pain, swelling, bleeding or pus, call/return!  You may shower after 24 hours, but no soaking in baths/hot tubs/pools for 1 week.    You have an appointment set up with the [facility].  Multiple studies have shown that being followed by a dedicated atrial fibrillation clinic in addition to the standard care you receive from your other physicians improves health. We believe that enrollment in the atrial fibrillation clinic will allow [place] to better care for you.   The phone number to the [facility] is [phone]. The clinic is staffed Monday through Friday from 8:30am to 5pm.  Parking Directions: The clinic is located in the Heart and Vascular Building connected to [facility]. 1)From [address] turn on to [address] and go to the 3rd entrance  (Heart and Vascular entrance) on the right. 2)Look to the right for Heart &Vascular Parking Garage. 3)A code for the entrance is require, for March is 1223.   4)Take the elevators to the 1st floor. Registration is in the room with the glass walls at the end of the hallway.  If you have any trouble parking or locating the clinic, please dont hesitate to call [phone].

## 2020-08-23 NOTE — Progress Notes (Signed)
Patient was given discharge instructions. He verbalized understanding. 

## 2020-08-23 NOTE — Anesthesia Preprocedure Evaluation (Addendum)
Anesthesia Evaluation  Patient identified by MRN, date of birth, ID band Patient awake    Reviewed: NPO status , Patient's Chart, lab work & pertinent test results, reviewed documented beta blocker date and time   Airway Mallampati: II  TM Distance: >3 FB Neck ROM: Full    Dental  (+) Teeth Intact   Pulmonary neg pulmonary ROS,    Pulmonary exam normal        Cardiovascular hypertension, Pt. on medications and Pt. on home beta blockers + dysrhythmias Atrial Fibrillation  Rhythm:Irregular Rate:Normal     Neuro/Psych negative neurological ROS  negative psych ROS   GI/Hepatic negative GI ROS, Neg liver ROS,   Endo/Other  negative endocrine ROS  Renal/GU negative Renal ROS  negative genitourinary   Musculoskeletal negative musculoskeletal ROS (+)   Abdominal (+)  Abdomen: soft. Bowel sounds: normal.  Peds  Hematology negative hematology ROS (+)   Anesthesia Other Findings   Reproductive/Obstetrics                             Anesthesia Physical Anesthesia Plan  ASA: III  Anesthesia Plan: General   Post-op Pain Management:    Induction: Intravenous  PONV Risk Score and Plan: 2 and Ondansetron, Dexamethasone and Treatment may vary due to age or medical condition  Airway Management Planned: Mask and Oral ETT  Additional Equipment: None  Intra-op Plan:   Post-operative Plan: Extubation in OR  Informed Consent: I have reviewed the patients History and Physical, chart, labs and discussed the procedure including the risks, benefits and alternatives for the proposed anesthesia with the patient or authorized representative who has indicated his/her understanding and acceptance.     Dental advisory given  Plan Discussed with: CRNA  Anesthesia Plan Comments: (Lab Results      Component                Value               Date                      WBC                      7.0                  08/10/2020                HGB                      17.9 (H)            08/10/2020                HCT                      52.4 (H)            08/10/2020                MCV                      93                  08/10/2020                PLT  244                 08/10/2020           Lab Results      Component                Value               Date                      NA                       139                 08/10/2020                K                        4.8                 08/10/2020                CO2                      26                  08/10/2020                GLUCOSE                  93                  08/10/2020                BUN                      17                  08/10/2020                CREATININE               1.45 (H)            08/10/2020                CALCIUM                  9.7                 08/10/2020                GFRNONAA                 52 (L)              08/10/2020                GFRAA                    60                  08/10/2020           ECHO 01/22: 1. Left ventricular ejection fraction, by estimation, is 40 to 45%. The  left ventricle has mildly decreased function. The left ventricle  demonstrates global hypokinesis. Left ventricular diastolic function could  not be evaluated.  2. Right ventricular systolic function  is normal. The right ventricular  size is normal.  3. Left atrial size was severely dilated.  4. Right atrial size was moderately dilated.  5. The mitral valve is normal in structure. No evidence of mitral valve  regurgitation. No evidence of mitral stenosis.  6. The aortic valve is tricuspid. Aortic valve regurgitation is trivial.  Mild aortic valve sclerosis is present, with no evidence of aortic valve  stenosis.  7. Aortic dilatation noted. There is mild dilatation of the aortic root,  measuring 39 mm.  8. The inferior vena cava is normal in size with greater than 50%   respiratory variability, suggesting right atrial pressure of 3 mmHg. )        Anesthesia Quick Evaluation

## 2020-08-23 NOTE — Progress Notes (Signed)
C/O sinus drainage

## 2020-08-23 NOTE — Anesthesia Procedure Notes (Signed)
Procedure Name: Intubation Date/Time: 08/23/2020 11:03 AM Performed by: Gaylene Brooks, CRNA Pre-anesthesia Checklist: Patient identified, Emergency Drugs available, Suction available and Patient being monitored Patient Re-evaluated:Patient Re-evaluated prior to induction Oxygen Delivery Method: Circle System Utilized Preoxygenation: Pre-oxygenation with 100% oxygen Induction Type: IV induction Ventilation: Mask ventilation without difficulty and Oral airway inserted - appropriate to patient size Laryngoscope Size: Glidescope and 3 Grade View: Grade I Tube type: Oral Tube size: 7.5 mm Number of attempts: 2 Airway Equipment and Method: Stylet and Oral airway Placement Confirmation: ETT inserted through vocal cords under direct vision,  positive ETCO2 and breath sounds checked- equal and bilateral Secured at: 23 cm Tube secured with: Tape Dental Injury: Teeth and Oropharynx as per pre-operative assessment  Comments: DL with Miller 2, grade 2/3 view. Unable to pass ETT. Grade 1/2 view with Glide. ETT passed/ +ETCO2, BBS=

## 2020-08-24 ENCOUNTER — Encounter (HOSPITAL_COMMUNITY): Payer: Self-pay | Admitting: Internal Medicine

## 2020-08-24 ENCOUNTER — Ambulatory Visit: Payer: 59 | Admitting: Internal Medicine

## 2020-08-24 NOTE — Anesthesia Postprocedure Evaluation (Signed)
Anesthesia Post Note  Patient: Michael Choi  Procedure(s) Performed: ATRIAL FIBRILLATION ABLATION (N/A )     Patient location during evaluation: PACU Anesthesia Type: General Level of consciousness: sedated and patient cooperative Pain management: pain level controlled Vital Signs Assessment: post-procedure vital signs reviewed and stable Respiratory status: spontaneous breathing Cardiovascular status: stable Anesthetic complications: no   No complications documented.  Last Vitals:  Vitals:   08/23/20 1510 08/23/20 1540  BP: 111/73 119/76  Pulse: 76 84  Resp: 12 15  Temp:    SpO2: 95% 93%    Last Pain:  Vitals:   08/23/20 1405  TempSrc: Temporal  PainSc:                  Nolon Nations

## 2020-09-07 ENCOUNTER — Ambulatory Visit: Payer: 59 | Admitting: Internal Medicine

## 2020-09-14 ENCOUNTER — Other Ambulatory Visit: Payer: Self-pay | Admitting: Internal Medicine

## 2020-09-19 ENCOUNTER — Encounter: Payer: Self-pay | Admitting: Internal Medicine

## 2020-09-19 ENCOUNTER — Ambulatory Visit: Payer: 59 | Admitting: Internal Medicine

## 2020-09-20 ENCOUNTER — Ambulatory Visit (HOSPITAL_COMMUNITY): Payer: 59 | Admitting: Physician Assistant

## 2020-09-24 ENCOUNTER — Ambulatory Visit: Payer: 59 | Admitting: Internal Medicine

## 2020-10-16 ENCOUNTER — Other Ambulatory Visit: Payer: Self-pay | Admitting: Internal Medicine

## 2020-10-17 NOTE — Telephone Encounter (Signed)
Pt's pharmacy is requesting a refill on pantoprazole. Would Dr. Allred like to refill this medication? Please address 

## 2020-10-22 ENCOUNTER — Other Ambulatory Visit: Payer: Self-pay

## 2020-10-22 DIAGNOSIS — I4819 Other persistent atrial fibrillation: Secondary | ICD-10-CM

## 2020-10-22 MED ORDER — APIXABAN 5 MG PO TABS: 5.0000 mg | ORAL_TABLET | Freq: Two times a day (BID) | ORAL | 3 refills | Status: DC

## 2020-10-22 NOTE — Telephone Encounter (Signed)
Prescription refill request for Eliquis received. Indication: a fib Last office visit: 08/10/20 Scr: 1.45 Age: 60 Weight: 105kg

## 2020-11-26 ENCOUNTER — Ambulatory Visit: Payer: 59 | Admitting: Internal Medicine

## 2021-01-04 ENCOUNTER — Ambulatory Visit (INDEPENDENT_AMBULATORY_CARE_PROVIDER_SITE_OTHER): Payer: 59 | Admitting: Internal Medicine

## 2021-01-04 ENCOUNTER — Encounter: Payer: Self-pay | Admitting: Internal Medicine

## 2021-01-04 ENCOUNTER — Other Ambulatory Visit: Payer: Self-pay

## 2021-01-04 VITALS — BP 128/90 | HR 77 | Ht 72.0 in | Wt 241.6 lb

## 2021-01-04 DIAGNOSIS — I4819 Other persistent atrial fibrillation: Secondary | ICD-10-CM | POA: Diagnosis not present

## 2021-01-04 DIAGNOSIS — I10 Essential (primary) hypertension: Secondary | ICD-10-CM

## 2021-01-04 DIAGNOSIS — I428 Other cardiomyopathies: Secondary | ICD-10-CM | POA: Diagnosis not present

## 2021-01-04 NOTE — H&P (View-Only) (Signed)
PCP: [name], MD Primary EP: Dr Harlow Ohms is a 60 y.o. male who presents today for routine electrophysiology followup.  Since his recent afib ablation, the patient reports doing reasonably well.   He had ERAF a couple of weeks post ablation and was not compliant with follow-up.  He also reports intermittent compliance with eliquis.  he denies procedure related complications. Today, he denies symptoms of palpitations, chest pain, shortness of breath,  lower extremity edema, dizziness, presyncope, or syncope.  The patient is otherwise without complaint today.   Past Medical History:  Diagnosis Date   Atrial flutter [facility])    s/p CTI ablation by Dr [name]   Atypical nevus 04/16/2016   left outer back (mild)    Hypertension    Melanoma ([place])    stage I,  resected.  did not require chemotherapy or radiation   Persistent atrial fibrillation [facility])    Past Surgical History:  Procedure Laterality Date   A-FLUTTER ABLATION N/A 03/05/2018   Procedure: A-FLUTTER ABLATION;  Surgeon: [name], MD;  Location: [place] CV LAB;  Service: Cardiovascular;  Laterality: N/A;   ATRIAL FIBRILLATION ABLATION N/A 08/23/2020   Procedure: ATRIAL FIBRILLATION ABLATION;  Surgeon: [name], MD;  Location: [place] CV LAB;  Service: Cardiovascular;  Laterality: N/A;   CARDIOVERSION N/A 11/27/2017   Procedure: CARDIOVERSION;  Surgeon: [name], MD;  Location: [place] ENDOSCOPY;  Service: Cardiovascular;  Laterality: N/A;   CARDIOVERSION N/A 03/18/2019   Procedure: CARDIOVERSION;  Surgeon: [name], MD;  Location: [facility] ENDOSCOPY;  Service: Cardiovascular;  Laterality: N/A;   CARDIOVERSION N/A 07/18/2020   Procedure: CARDIOVERSION;  Surgeon: [name], MD;  Location: [facility] ENDOSCOPY;  Service: Cardiovascular;  Laterality: N/A;   KNEE ARTHROSCOPY     TEE WITHOUT CARDIOVERSION N/A 11/27/2017   Procedure: TRANSESOPHAGEAL ECHOCARDIOGRAM (TEE);  Surgeon: [name], MD;   Location: [facility] ENDOSCOPY;  Service: Cardiovascular;  Laterality: N/A;   TEE WITHOUT CARDIOVERSION N/A 03/18/2019   Procedure: TRANSESOPHAGEAL ECHOCARDIOGRAM (TEE);  Surgeon: [name], MD;  Location: [facility] ENDOSCOPY;  Service: Cardiovascular;  Laterality: N/A;    ROS- all systems are personally reviewed and negatives except as per HPI above  Current Outpatient Medications  Medication Sig Dispense Refill   amLODipine (NORVASC) 5 MG tablet Take 5 mg by mouth at bedtime.   3   losartan (COZAAR) 50 MG tablet Take 50 mg by mouth daily.  3   MAGNESIUM PO Take 1 tablet by mouth 3 (three) times a week.     metoprolol tartrate (LOPRESSOR) 25 MG tablet TAKE 1 TABLET BY MOUTH 2 TIMES DAILY. 180 tablet 2   multivitamin (THERAGRAN) per tablet Take 1 tablet by mouth 3 (three) times a week.     testosterone cypionate (DEPOTESTOSTERONE CYPIONATE) 200 MG/ML injection Inject 150 mg into the muscle every Saturday. .75 ml     apixaban (ELIQUIS) 5 MG TABS tablet Take 1 tablet (5 mg total) by mouth 2 (two) times daily. 60 tablet 3   No current facility-administered medications for this visit.    Physical Exam: Vitals:   01/04/21 0837  BP: 128/90  Pulse: 77  SpO2: 96%  Weight: 241 lb 9.6 oz (109.6 kg)  Height: 6' (1.829 m)    GEN- The patient is well appearing, alert and oriented x 3 today.   Head- normocephalic, atraumatic Eyes-  Sclera clear, conjunctiva pink Ears- hearing intact Oropharynx- clear Lungs- Clear to ausculation bilaterally, normal work of  breathing Heart- irregular rate and rhythm, no murmurs, rubs or gallops, PMI not laterally displaced GI- soft, NT, ND, + BS Extremities- no clubbing, cyanosis, or edema  EKG tracing ordered today is personally reviewed and shows atypical atria flutter  Assessment and Plan:  1. Persistent atrial fibrillation He had ERAF post ablation but was not adherent with follow-up. chads2vasc score is 2. I am worried about his long term success.    Importance of compliance with eliquis and follow-up was discussed at length.   Risks and benefits to [facility] were discussed with the patient who wishes to proceed. Will follow-up in AF clinic 1 week post cardioversion. If he has further afib, would need to consider AAD vs convergent procedure at that time  2. Nonischemic CM EF 45% Hopefully will improve with sinus  3. HTN Stable No change required today    [name] MD, [facility] 01/04/2021 9:16 AM

## 2021-01-04 NOTE — Patient Instructions (Signed)
Medication Instructions:  Your physician recommends that you continue on your current medications as directed. Please refer to the Current Medication list given to you today.  Labwork: None ordered.  Testing/Procedures: None ordered.  Follow-Up: Your physician wants you to follow-up in: 1 week in the afib clinic post cardioversion. They will contact you to schedule.    Any Other Special Instructions Will Be Listed Below (If Applicable).  If you need a refill on your cardiac medications before your next appointment, please call your pharmacy.     You are scheduled for a \\Cardioversion  on July 1 with Dr. Audie Box.  Please arrive at the [facility] (Main Entrance A) at [facility]: [place], [place] [address] at am.   DIET: Nothing to eat or drink after midnight except a sip of water with medications (see medication instructions below)  FYI: For your safety, and to allow [place] to monitor your vital signs accurately during the surgery/procedure we request that   if you have artificial nails, gel coating, SNS etc. Please have those removed prior to your surgery/procedure. Not having the nail coverings /polish removed may result in cancellation or delay of your surgery/procedure.   Medication Instructions:  Take all am medications   Continue your anticoagulant: Eliquis  You will need to continue your anticoagulant after your procedure until you  are told by your  Provider that it is safe to stop  You must have a responsible person to drive you home and stay in the waiting area during your procedure. Failure to do so could result in cancellation.  Bring your insurance cards.  *Special Note: Every effort is made to have your procedure done on time. Occasionally there are emergencies that occur at the hospital that may cause delays. Please be patient if a delay does occur.

## 2021-01-04 NOTE — Progress Notes (Signed)
PCP: [name], MD Primary EP: Dr Harlow Ohms is a 60 y.o. male who presents today for routine electrophysiology followup.  Since his recent afib ablation, the patient reports doing reasonably well.   He had ERAF a couple of weeks post ablation and was not compliant with follow-up.  He also reports intermittent compliance with eliquis.  he denies procedure related complications. Today, he denies symptoms of palpitations, chest pain, shortness of breath,  lower extremity edema, dizziness, presyncope, or syncope.  The patient is otherwise without complaint today.   Past Medical History:  Diagnosis Date   Atrial flutter [facility]    s/p CTI ablation by Dr [name]   Atypical nevus 04/16/2016   left outer back (mild)    Hypertension    Melanoma ([place])    stage I,  resected.  did not require chemotherapy or radiation   Persistent atrial fibrillation [facility])    Past Surgical History:  Procedure Laterality Date   A-FLUTTER ABLATION N/A 03/05/2018   Procedure: A-FLUTTER ABLATION;  Surgeon: [name], MD;  Location: [place] CV LAB;  Service: Cardiovascular;  Laterality: N/A;   ATRIAL FIBRILLATION ABLATION N/A 08/23/2020   Procedure: ATRIAL FIBRILLATION ABLATION;  Surgeon: [name], MD;  Location: [place] CV LAB;  Service: Cardiovascular;  Laterality: N/A;   CARDIOVERSION N/A 11/27/2017   Procedure: CARDIOVERSION;  Surgeon: [name], MD;  Location: [place] ENDOSCOPY;  Service: Cardiovascular;  Laterality: N/A;   CARDIOVERSION N/A 03/18/2019   Procedure: CARDIOVERSION;  Surgeon: [name], MD;  Location: [facility] ENDOSCOPY;  Service: Cardiovascular;  Laterality: N/A;   CARDIOVERSION N/A 07/18/2020   Procedure: CARDIOVERSION;  Surgeon: [name], MD;  Location: [facility] ENDOSCOPY;  Service: Cardiovascular;  Laterality: N/A;   KNEE ARTHROSCOPY     TEE WITHOUT CARDIOVERSION N/A 11/27/2017   Procedure: TRANSESOPHAGEAL ECHOCARDIOGRAM (TEE);  Surgeon: [name], MD;   Location: [facility] ENDOSCOPY;  Service: Cardiovascular;  Laterality: N/A;   TEE WITHOUT CARDIOVERSION N/A 03/18/2019   Procedure: TRANSESOPHAGEAL ECHOCARDIOGRAM (TEE);  Surgeon: [name], MD;  Location: [facility] ENDOSCOPY;  Service: Cardiovascular;  Laterality: N/A;    ROS- all systems are personally reviewed and negatives except as per HPI above  Current Outpatient Medications  Medication Sig Dispense Refill   amLODipine (NORVASC) 5 MG tablet Take 5 mg by mouth at bedtime.   3   losartan (COZAAR) 50 MG tablet Take 50 mg by mouth daily.  3   MAGNESIUM PO Take 1 tablet by mouth 3 (three) times a week.     metoprolol tartrate (LOPRESSOR) 25 MG tablet TAKE 1 TABLET BY MOUTH 2 TIMES DAILY. 180 tablet 2   multivitamin (THERAGRAN) per tablet Take 1 tablet by mouth 3 (three) times a week.     testosterone cypionate (DEPOTESTOSTERONE CYPIONATE) 200 MG/ML injection Inject 150 mg into the muscle every Saturday. .75 ml     apixaban (ELIQUIS) 5 MG TABS tablet Take 1 tablet (5 mg total) by mouth 2 (two) times daily. 60 tablet 3   No current facility-administered medications for this visit.    Physical Exam: Vitals:   01/04/21 0837  BP: 128/90  Pulse: 77  SpO2: 96%  Weight: 241 lb 9.6 oz (109.6 kg)  Height: 6' (1.829 m)    GEN- The patient is well appearing, alert and oriented x 3 today.   Head- normocephalic, atraumatic Eyes-  Sclera clear, conjunctiva pink Ears- hearing intact Oropharynx- clear Lungs- Clear to ausculation bilaterally, normal work of  breathing Heart- irregular rate and rhythm, no murmurs, rubs or gallops, PMI not laterally displaced GI- soft, NT, ND, + BS Extremities- no clubbing, cyanosis, or edema  EKG tracing ordered today is personally reviewed and shows atypical atria flutter  Assessment and Plan:  1. Persistent atrial fibrillation He had ERAF post ablation but was not adherent with follow-up. chads2vasc score is 2. I am worried about his long term success.    Importance of compliance with eliquis and follow-up was discussed at length.   Risks and benefits to [facility] were discussed with the patient who wishes to proceed. Will follow-up in AF clinic 1 week post cardioversion. If he has further afib, would need to consider AAD vs convergent procedure at that time  2. Nonischemic CM EF 45% Hopefully will improve with sinus  3. HTN Stable No change required today    [name] MD, [facility] 01/04/2021 9:16 AM

## 2021-01-18 ENCOUNTER — Encounter (HOSPITAL_COMMUNITY): Payer: Self-pay | Admitting: Cardiovascular Disease

## 2021-01-18 ENCOUNTER — Ambulatory Visit (HOSPITAL_COMMUNITY)
Admission: RE | Admit: 2021-01-18 | Discharge: 2021-01-18 | Disposition: A | Discharge status: Home / Self Care | Payer: PRIVATE HEALTH INSURANCE | Attending: Cardiovascular Disease | Admitting: Cardiovascular Disease

## 2021-01-18 ENCOUNTER — Ambulatory Visit (HOSPITAL_COMMUNITY): Payer: PRIVATE HEALTH INSURANCE | Admitting: Anesthesiology

## 2021-01-18 ENCOUNTER — Encounter (HOSPITAL_COMMUNITY)
Admission: RE | Disposition: A | Discharge status: Home / Self Care | Payer: Self-pay | Source: Home / Self Care | Attending: Cardiovascular Disease

## 2021-01-18 ENCOUNTER — Other Ambulatory Visit: Payer: Self-pay

## 2021-01-18 DIAGNOSIS — I428 Other cardiomyopathies: Secondary | ICD-10-CM | POA: Diagnosis not present

## 2021-01-18 DIAGNOSIS — I10 Essential (primary) hypertension: Secondary | ICD-10-CM | POA: Diagnosis not present

## 2021-01-18 DIAGNOSIS — Z7901 Long term (current) use of anticoagulants: Secondary | ICD-10-CM | POA: Diagnosis not present

## 2021-01-18 DIAGNOSIS — I4819 Other persistent atrial fibrillation: Secondary | ICD-10-CM | POA: Diagnosis present

## 2021-01-18 DIAGNOSIS — I4891 Unspecified atrial fibrillation: Secondary | ICD-10-CM | POA: Diagnosis not present

## 2021-01-18 DIAGNOSIS — Z79899 Other long term (current) drug therapy: Secondary | ICD-10-CM | POA: Insufficient documentation

## 2021-01-18 HISTORY — PX: CARDIOVERSION: SHX1299

## 2021-01-18 SURGERY — CARDIOVERSION
Anesthesia: General

## 2021-01-18 MED ORDER — PROPOFOL 10 MG/ML IV BOLUS
INTRAVENOUS | Status: DC | PRN
  Administered 2021-01-18: 80 mg via INTRAVENOUS
  Administered 2021-01-18: 20 mg via INTRAVENOUS

## 2021-01-18 MED ORDER — SODIUM CHLORIDE 0.9 % IV SOLN: INTRAVENOUS | Status: DC | PRN

## 2021-01-18 MED ORDER — LIDOCAINE HCL (CARDIAC) PF 100 MG/5ML IV SOSY
PREFILLED_SYRINGE | INTRAVENOUS | Status: DC | PRN
  Administered 2021-01-18: 50 mg via INTRAVENOUS

## 2021-01-18 NOTE — Transfer of Care (Signed)
Immediate Anesthesia Transfer of Care Note  Patient: Michael Choi  Procedure(s) Performed: CARDIOVERSION  Patient Location: Endoscopy Unit  Anesthesia Type:General  Level of Consciousness: sedated and patient cooperative  Airway & Oxygen Therapy: Patient Spontanous Breathing and Patient connected to nasal cannula oxygen  Post-op Assessment: Report given to RN and Post -op Vital signs reviewed and stable  Post vital signs: Reviewed  Last Vitals:  Vitals Value Taken Time  BP    Temp    Pulse    Resp    SpO2      Last Pain:  Vitals:   01/18/21 0727  TempSrc: Oral  PainSc: 0-No pain         Complications: No notable events documented.

## 2021-01-18 NOTE — Discharge Instructions (Signed)
Electrical Cardioversion  Electrical cardioversion is the delivery of a jolt of electricity to restore a normal rhythm to the heart. A rhythm that is too fast or is not regular keeps the heart from pumping well. In this procedure, sticky patches or metal paddles are placed on the chest to deliver electricity to the heart from a device.  What can I expect after the procedure?  Your blood pressure, heart rate, breathing rate, and blood oxygen level will be monitored until you leave the hospital or clinic.  Your heart rhythm will be watched to make sure it does not change.  You may have some redness on the skin where the shocks were given.If this occurs, can use hydrocortisone cream or Aloe vera.  Follow these instructions at home:  Do not drive for 24 hours if you were given a sedative during your procedure.  Take over-the-counter and prescription medicines only as told by your health care provider.  Ask your health care provider how to check your pulse. Check it often.  Rest for 48 hours after the procedure or as told by your health care provider.  Avoid or limit your caffeine use as told by your health care provider.  Keep all follow-up visits as told by your health care provider. This is important.  Contact a health care provider if:  You feel like your heart is beating too quickly or your pulse is not regular.  You have a serious muscle cramp that does not go away.  Get help right away if:  You have discomfort in your chest.  You are dizzy or you feel faint.  You have trouble breathing or you are short of breath.  Your speech is slurred.  You have trouble moving an arm or leg on one side of your body.  Your fingers or toes turn cold or blue.  Summary  Electrical cardioversion is the delivery of a jolt of electricity to restore a normal rhythm to the heart.  This procedure may be done right away in an emergency or may be a scheduled procedure if the condition is not  an emergency.  Generally, this is a safe procedure.  After the procedure, check your pulse often as told by your health care provider.  This information is not intended to replace advice given to you by your health care provider. Make sure you discuss any questions you have with your health care provider. Document Revised: 02/07/2019 Document Reviewed: 02/07/2019 Elsevier Patient Education  2021 Elsevier Inc.  

## 2021-01-18 NOTE — Anesthesia Preprocedure Evaluation (Addendum)
Anesthesia Evaluation  Patient identified by MRN, date of birth, ID band Patient awake    Reviewed: Allergy & Precautions, NPO status , Patient's Chart, lab work & pertinent test results, reviewed documented beta blocker date and time   Airway Mallampati: III  TM Distance: >3 FB Neck ROM: Full    Dental no notable dental hx. (+) Teeth Intact, Dental Advisory Given   Pulmonary neg pulmonary ROS,    Pulmonary exam normal breath sounds clear to auscultation       Cardiovascular hypertension, Pt. on medications and Pt. on home beta blockers Normal cardiovascular exam+ dysrhythmias (on eliquis) Atrial Fibrillation  Rhythm:Irregular Rate:Normal  TTE 2022 1. Left ventricular ejection fraction, by estimation, is 40 to 45%. The  left ventricle has mildly decreased function. The left ventricle  demonstrates global hypokinesis. Left ventricular diastolic function could  not be evaluated.  2. Right ventricular systolic function is normal. The right ventricular  size is normal.  3. Left atrial size was severely dilated.  4. Right atrial size was moderately dilated.  5. The mitral valve is normal in structure. No evidence of mitral valve  regurgitation. No evidence of mitral stenosis.  6. The aortic valve is tricuspid. Aortic valve regurgitation is trivial.  Mild aortic valve sclerosis is present, with no evidence of aortic valve  stenosis.  7. Aortic dilatation noted. There is mild dilatation of the aortic root,  measuring 39 mm.  8. The inferior vena cava is normal in size with greater than 50%  respiratory variability, suggesting right atrial pressure of 3 mmHg.    Neuro/Psych negative neurological ROS  negative psych ROS   GI/Hepatic negative GI ROS, Neg liver ROS,   Endo/Other  negative endocrine ROS  Renal/GU negative Renal ROS  negative genitourinary   Musculoskeletal negative musculoskeletal ROS (+)   Abdominal    Peds  Hematology negative hematology ROS (+)   Anesthesia Other Findings   Reproductive/Obstetrics                            Anesthesia Physical Anesthesia Plan  ASA: 3  Anesthesia Plan: General   Post-op Pain Management:    Induction: Intravenous  PONV Risk Score and Plan: 2 and Propofol infusion and Treatment may vary due to age or medical condition  Airway Management Planned: Natural Airway  Additional Equipment:   Intra-op Plan:   Post-operative Plan:   Informed Consent: I have reviewed the patients History and Physical, chart, labs and discussed the procedure including the risks, benefits and alternatives for the proposed anesthesia with the patient or authorized representative who has indicated his/her understanding and acceptance.     Dental advisory given  Plan Discussed with: CRNA  Anesthesia Plan Comments:         Anesthesia Quick Evaluation

## 2021-01-18 NOTE — Anesthesia Postprocedure Evaluation (Signed)
Anesthesia Post Note  Patient: Michael Choi  Procedure(s) Performed: CARDIOVERSION     Patient location during evaluation: Endoscopy Anesthesia Type: General Level of consciousness: awake and alert Pain management: pain level controlled Vital Signs Assessment: post-procedure vital signs reviewed and stable Respiratory status: spontaneous breathing, nonlabored ventilation, respiratory function stable and patient connected to nasal cannula oxygen Cardiovascular status: blood pressure returned to baseline and stable Postop Assessment: no apparent nausea or vomiting Anesthetic complications: no   No notable events documented.  Last Vitals:  Vitals:   01/18/21 0727 01/18/21 0852  BP: (!) 147/106 123/89  Pulse: 74 65  Resp: 15 12  Temp: 36.7 C 36.6 C  SpO2: 95% 98%    Last Pain:  Vitals:   01/18/21 0852  TempSrc: Temporal  PainSc: 0-No pain                 Yony Roulston L Emari Demmer

## 2021-01-18 NOTE — CV Procedure (Signed)
DIRECT CURRENT CARDIOVERSION  NAME:  Michael Choi    MRN: [id number] DOB:  [birth year]    ADMIT DATE: 01/18/2021  Indication:  Symptomatic atrial fibrillation  Procedure Note:  The patient signed informed consent.  They have had had therapeutic anticoagulation with eliquis greater than 3 weeks.  Anesthesia was administered by Dr. Armond Hang.  Adequate airway was maintained throughout and vital followed per protocol.  They were cardioverted x 1 with 200J of biphasic synchronized energy.  They converted to NSR.  There were no apparent complications.  The patient had normal neuro status and respiratory status post procedure with vitals stable as recorded elsewhere.    Follow up: They will continue on current medical therapy and follow up with cardiology as scheduled.  Gerri Spore T. Flora Lipps, MD, [facility]  [facility]  [address] [place], [place] [address] [phone]  8:43 AM

## 2021-01-18 NOTE — Interval H&P Note (Signed)
History and Physical Interval Note:  01/18/2021 7:54 AM  Michael Choi  has presented today for surgery, with the diagnosis of AFIB.  The various methods of treatment have been discussed with the patient and family. After consideration of risks, benefits and other options for treatment, the patient has consented to  Procedure(s): CARDIOVERSION (N/A) as a surgical intervention.  The patient's history has been reviewed, patient examined, no change in status, stable for surgery.  I have reviewed the patient's chart and labs.  Questions were answered to the patient's satisfaction.    NPO for DCCV. On eliquis >3 weeks. No missed doses.   [place] T. Audie Box, MD, [place]  [address] [place], [place] [address] [phone]  7:54 AM

## 2021-01-18 NOTE — Anesthesia Procedure Notes (Signed)
Procedure Name: General with mask airway Date/Time: 01/18/2021 8:37 AM Performed by: Jenne Campus, CRNA Pre-anesthesia Checklist: Patient identified, Emergency Drugs available, Suction available and Patient being monitored Patient Re-evaluated:Patient Re-evaluated prior to induction Oxygen Delivery Method: Ambu bag

## 2021-01-28 ENCOUNTER — Ambulatory Visit (HOSPITAL_COMMUNITY): Payer: 59 | Admitting: Physician Assistant

## 2021-02-01 ENCOUNTER — Ambulatory Visit (HOSPITAL_COMMUNITY): Payer: 59 | Admitting: Physician Assistant

## 2021-02-05 ENCOUNTER — Other Ambulatory Visit: Payer: Self-pay | Admitting: *Deleted

## 2021-02-05 DIAGNOSIS — I4819 Other persistent atrial fibrillation: Secondary | ICD-10-CM

## 2021-02-05 MED ORDER — APIXABAN 5 MG PO TABS: 5.0000 mg | ORAL_TABLET | Freq: Two times a day (BID) | ORAL | 1 refills | Status: DC

## 2021-02-05 NOTE — Telephone Encounter (Signed)
Prescription refill request for Eliquis received. Indication: aflutter Last office visit: allred 01/04/2021 Scr: 1.45, 08/10/2020 Age: 60 yo  Weight: 109.6 kg   Pt is on the correct dose of Eliquis per dosing criteria, prescription refill sent for Eliquis 5mg  BID.

## 2021-02-05 NOTE — Addendum Note (Signed)
Addended by: Johny Shock B on: 02/05/2021 03:12 PM   Modules accepted: Orders

## 2021-02-28 ENCOUNTER — Other Ambulatory Visit: Payer: Self-pay | Admitting: Internal Medicine

## 2021-03-27 ENCOUNTER — Other Ambulatory Visit: Payer: Self-pay

## 2021-03-27 MED ORDER — METOPROLOL TARTRATE 25 MG PO TABS: 25.0000 mg | ORAL_TABLET | Freq: Two times a day (BID) | ORAL | 2 refills | Status: DC

## 2021-03-27 NOTE — Telephone Encounter (Signed)
Pt's medication was sent to pt's pharmacy as requested. Confirmation received.  °

## 2021-04-10 ENCOUNTER — Ambulatory Visit (INDEPENDENT_AMBULATORY_CARE_PROVIDER_SITE_OTHER): Payer: 59 | Admitting: Internal Medicine

## 2021-04-10 ENCOUNTER — Other Ambulatory Visit: Payer: Self-pay

## 2021-04-10 VITALS — BP 126/78 | HR 78 | Ht 72.0 in | Wt 245.6 lb

## 2021-04-10 DIAGNOSIS — I10 Essential (primary) hypertension: Secondary | ICD-10-CM | POA: Diagnosis not present

## 2021-04-10 DIAGNOSIS — I4819 Other persistent atrial fibrillation: Secondary | ICD-10-CM

## 2021-04-10 DIAGNOSIS — I428 Other cardiomyopathies: Secondary | ICD-10-CM | POA: Diagnosis not present

## 2021-04-10 MED ORDER — FLECAINIDE ACETATE 50 MG PO TABS: 50.0000 mg | ORAL_TABLET | Freq: Two times a day (BID) | ORAL | 3 refills | Status: DC

## 2021-04-10 NOTE — Progress Notes (Signed)
PCP: [name], MD   Primary EP: Dr Harlow Ohms is a 60 y.o. male who presents today for routine electrophysiology followup.  Since his cardioversion, the patient reports doing very well.  He continues to have frequent palpitations suggestive of afib.  He has SOB and fatigue with these.  Today, he denies symptoms of chest pain, shortness of breath,  lower extremity edema, dizziness, presyncope, or syncope.  The patient is otherwise without complaint today.   Past Medical History:  Diagnosis Date   Atrial flutter [facility])    s/p CTI ablation by Dr [name]   Atypical nevus 04/16/2016   left outer back (mild)    Hypertension    Melanoma ([place])    stage I,  resected.  did not require chemotherapy or radiation   Persistent atrial fibrillation [facility])    Past Surgical History:  Procedure Laterality Date   A-FLUTTER ABLATION N/A 03/05/2018   Procedure: A-FLUTTER ABLATION;  Surgeon: [name], MD;  Location: [place] CV LAB;  Service: Cardiovascular;  Laterality: N/A;   ATRIAL FIBRILLATION ABLATION N/A 08/23/2020   Procedure: ATRIAL FIBRILLATION ABLATION;  Surgeon: [name], MD;  Location: [place] CV LAB;  Service: Cardiovascular;  Laterality: N/A;   CARDIOVERSION N/A 11/27/2017   Procedure: CARDIOVERSION;  Surgeon: [name], MD;  Location: [place] ENDOSCOPY;  Service: Cardiovascular;  Laterality: N/A;   CARDIOVERSION N/A 03/18/2019   Procedure: CARDIOVERSION;  Surgeon: [name], MD;  Location: [facility] ENDOSCOPY;  Service: Cardiovascular;  Laterality: N/A;   CARDIOVERSION N/A 07/18/2020   Procedure: CARDIOVERSION;  Surgeon: [name], MD;  Location: [facility] ENDOSCOPY;  Service: Cardiovascular;  Laterality: N/A;   CARDIOVERSION N/A 01/18/2021   Procedure: CARDIOVERSION;  Surgeon: [name], MD;  Location: [place];  Service: Cardiovascular;  Laterality: N/A;   KNEE ARTHROSCOPY     TEE WITHOUT CARDIOVERSION N/A 11/27/2017   Procedure: TRANSESOPHAGEAL  ECHOCARDIOGRAM (TEE);  Surgeon: [name], MD;  Location: [facility] ENDOSCOPY;  Service: Cardiovascular;  Laterality: N/A;   TEE WITHOUT CARDIOVERSION N/A 03/18/2019   Procedure: TRANSESOPHAGEAL ECHOCARDIOGRAM (TEE);  Surgeon: [name], MD;  Location: [facility] ENDOSCOPY;  Service: Cardiovascular;  Laterality: N/A;    ROS- all systems are reviewed and negatives except as per HPI above  Current Outpatient Medications  Medication Sig Dispense Refill   apixaban (ELIQUIS) 5 MG TABS tablet Take 1 tablet (5 mg total) by mouth 2 (two) times daily. 180 tablet 1   losartan (COZAAR) 50 MG tablet Take 50 mg by mouth in the morning and at bedtime.  3   MAGNESIUM PO Take 1 tablet by mouth every other day.     metoprolol tartrate (LOPRESSOR) 25 MG tablet Take 1 tablet (25 mg total) by mouth 2 (two) times daily. 180 tablet 2   multivitamin (THERAGRAN) per tablet Take 1 tablet by mouth every other day.     testosterone cypionate (DEPOTESTOSTERONE CYPIONATE) 200 MG/ML injection Inject 200 mg into the muscle every 14 (fourteen) days.     amLODipine (NORVASC) 5 MG tablet Take 5 mg by mouth at bedtime.  (Patient not taking: Reported on 04/10/2021)  3   No current facility-administered medications for this visit.    Physical Exam: Vitals:   04/10/21 1156  BP: 126/78  Pulse: 78  SpO2: 93%  Weight: 245 lb 9.6 oz (111.4 kg)  Height: 6' (1.829 m)    GEN- The patient is well appearing, alert and oriented x 3 today.  Head- normocephalic, atraumatic Eyes-  Sclera clear, conjunctiva pink Ears- hearing intact Oropharynx- clear Lungs- Clear to ausculation bilaterally, normal work of breathing Heart- Regular rate and rhythm, no murmurs, rubs or gallops, PMI not laterally displaced GI- soft, NT, ND, + BS Extremities- no clubbing, cyanosis, or edema  Wt Readings from Last 3 Encounters:  04/10/21 245 lb 9.6 oz (111.4 kg)  01/04/21 241 lb 9.6 oz (109.6 kg)  08/23/20 232 lb (105.2 kg)    EKG tracing  ordered today is personally reviewed and shows sinus with PACs  Assessment and Plan:  Persistent afib Continues to have symptomatic afib Chads2vasc score is 2.  He is on eliquis He would like to avoid repeat ablation at this time. We will start flecainide 50mg  BID  2. Nonischemic CM- likely tachycardia mediated Hopefully EF will improve with sinus Repeat echo on return in 2 months Ok to start flecainide in the interim  3. HTN Stable No change required today    Risks, benefits and potential toxicities for medications prescribed and/or refilled reviewed with patient today.   Follow-up with EP APP in 4 weeks.  Could consider increasing flecainide if still having afib I will see in 2 months  [name] MD, [facility] 04/10/2021 12:20 PM

## 2021-04-10 NOTE — Patient Instructions (Addendum)
Medication Instructions:  Start Flecainide 50 mg two times a day.  Your physician recommends that you continue on your current medications as directed. Please refer to the Current Medication list given to you today.  Labwork: None ordered.  Testing/Procedures: Your physician has requested that you have an echocardiogram. Echocardiography is a painless test that uses sound waves to create images of your heart. It provides your doctor with information about the size and shape of your heart and how well your heart's chambers and valves are working. This procedure takes approximately one hour. There are no restrictions for this procedure.   Follow-Up: Your physician wants you to follow-up in: 4 weeks with Michael Choi, [place] 2 months with  Michael Grayer, MD      Any Other Special Instructions Will Be Listed Below (If Applicable).  If you need a refill on your cardiac medications before your next appointment, please call your pharmacy.

## 2021-04-26 ENCOUNTER — Ambulatory Visit (HOSPITAL_COMMUNITY): Payer: 59 | Attending: Cardiovascular Disease

## 2021-04-26 ENCOUNTER — Other Ambulatory Visit: Payer: Self-pay

## 2021-04-26 DIAGNOSIS — I10 Essential (primary) hypertension: Secondary | ICD-10-CM | POA: Diagnosis present

## 2021-04-26 DIAGNOSIS — I4819 Other persistent atrial fibrillation: Secondary | ICD-10-CM | POA: Diagnosis not present

## 2021-04-26 DIAGNOSIS — I428 Other cardiomyopathies: Secondary | ICD-10-CM | POA: Insufficient documentation

## 2021-04-26 LAB — ECHOCARDIOGRAM COMPLETE
Area-P 1/2: 3.77 cm2
S' Lateral: 4.6 cm

## 2021-05-03 ENCOUNTER — Ambulatory Visit (INDEPENDENT_AMBULATORY_CARE_PROVIDER_SITE_OTHER): Payer: 59 | Admitting: Student

## 2021-05-03 ENCOUNTER — Other Ambulatory Visit: Payer: Self-pay

## 2021-05-03 ENCOUNTER — Encounter: Payer: Self-pay | Admitting: Student

## 2021-05-03 VITALS — BP 122/82 | HR 69 | Ht 72.0 in | Wt 245.2 lb

## 2021-05-03 DIAGNOSIS — I428 Other cardiomyopathies: Secondary | ICD-10-CM | POA: Diagnosis not present

## 2021-05-03 DIAGNOSIS — I4819 Other persistent atrial fibrillation: Secondary | ICD-10-CM | POA: Diagnosis not present

## 2021-05-03 DIAGNOSIS — I10 Essential (primary) hypertension: Secondary | ICD-10-CM

## 2021-05-03 MED ORDER — METOPROLOL SUCCINATE ER 50 MG PO TB24: 50.0000 mg | ORAL_TABLET | Freq: Every day | ORAL | 3 refills | Status: DC

## 2021-05-03 NOTE — Progress Notes (Signed)
PCP:  [name], MD Primary Cardiologist: None Electrophysiologist: [name], MD   Michael Choi is a 60 y.o. male seen today for [name], MD for routine electrophysiology followup.  Since last being seen in our clinic the patient reports doing about the same. He continues to have intermittent palpitations consistent with his AF every 2-3 days. He started having HAs after being on flecainide for multiple days in a row, so has now started taking it as needed in the ams.   He works from [place], and mostly he is bothered by palpitations when waking up in the am.  Denies exertional chest pain, palpitations, dyspnea, PND, orthopnea, nausea, vomiting, dizziness, syncope, weight gain, or early satiety. Mild peripheral edema "sock rings" at the end of the day.   Past Medical History:  Diagnosis Date   Atrial flutter [facility])    s/p CTI ablation by Dr [name]   Atypical nevus 04/16/2016   left outer back (mild)    Hypertension    Melanoma ([place])    stage I,  resected.  did not require chemotherapy or radiation   Persistent atrial fibrillation [facility])    Past Surgical History:  Procedure Laterality Date   A-FLUTTER ABLATION N/A 03/05/2018   Procedure: A-FLUTTER ABLATION;  Surgeon: [name], MD;  Location: [place] CV LAB;  Service: Cardiovascular;  Laterality: N/A;   ATRIAL FIBRILLATION ABLATION N/A 08/23/2020   Procedure: ATRIAL FIBRILLATION ABLATION;  Surgeon: [name], MD;  Location: [place] CV LAB;  Service: Cardiovascular;  Laterality: N/A;   CARDIOVERSION N/A 11/27/2017   Procedure: CARDIOVERSION;  Surgeon: [name], MD;  Location: [place] ENDOSCOPY;  Service: Cardiovascular;  Laterality: N/A;   CARDIOVERSION N/A 03/18/2019   Procedure: CARDIOVERSION;  Surgeon: [name], MD;  Location: [facility] ENDOSCOPY;  Service: Cardiovascular;  Laterality: N/A;   CARDIOVERSION N/A 07/18/2020   Procedure: CARDIOVERSION;  Surgeon: [name], MD;  Location: [facility] ENDOSCOPY;   Service: Cardiovascular;  Laterality: N/A;   CARDIOVERSION N/A 01/18/2021   Procedure: CARDIOVERSION;  Surgeon: [name], MD;  Location: [place];  Service: Cardiovascular;  Laterality: N/A;   KNEE ARTHROSCOPY     TEE WITHOUT CARDIOVERSION N/A 11/27/2017   Procedure: TRANSESOPHAGEAL ECHOCARDIOGRAM (TEE);  Surgeon: [name], MD;  Location: [facility] ENDOSCOPY;  Service: Cardiovascular;  Laterality: N/A;   TEE WITHOUT CARDIOVERSION N/A 03/18/2019   Procedure: TRANSESOPHAGEAL ECHOCARDIOGRAM (TEE);  Surgeon: [name], MD;  Location: [facility] ENDOSCOPY;  Service: Cardiovascular;  Laterality: N/A;    Current Outpatient Medications  Medication Sig Dispense Refill   amLODipine (NORVASC) 5 MG tablet Take 5 mg by mouth at bedtime.  3   apixaban (ELIQUIS) 5 MG TABS tablet Take 1 tablet (5 mg total) by mouth 2 (two) times daily. 180 tablet 1   flecainide (TAMBOCOR) 50 MG tablet Take 1 tablet (50 mg total) by mouth 2 (two) times daily. 180 tablet 3   losartan (COZAAR) 50 MG tablet Take 50 mg by mouth in the morning and at bedtime.  3   MAGNESIUM PO Take 1 tablet by mouth every other day.     metoprolol tartrate (LOPRESSOR) 25 MG tablet Take 1 tablet (25 mg total) by mouth 2 (two) times daily. 180 tablet 2   multivitamin (THERAGRAN) per tablet Take 1 tablet by mouth every other day.     tadalafil (CIALIS) 10 MG tablet Take 10 mg by mouth daily.     testosterone cypionate (DEPOTESTOSTERONE CYPIONATE) 200 MG/ML injection Inject  200 mg into the muscle every 14 (fourteen) days.     No current facility-administered medications for this visit.    Allergies  Allergen Reactions   Doxycycline Hives, Swelling and Other (See Comments)    "I felt a little swelling in my throat," so he ceased taking it    Social History   Socioeconomic History   Marital status: Single    Spouse name: Not on file   Number of children: Not on file   Years of education: Not on file   Highest education  level: Not on file  Occupational History   Not on file  Tobacco Use   Smoking status: Never   Smokeless tobacco: Never  Vaping Use   Vaping Use: Never used  Substance and Sexual Activity   Alcohol use: Yes    Alcohol/week: 1.0 standard drink    Types: 1 Standard drinks or equivalent per week    Comment: glass of wine twice per month   Drug use: No   Sexual activity: Not on file  Other Topics Concern   Not on file  Social History Narrative   Lives in [place] with fiance   Works with department [employer]   Social Determinants of Health   Financial Resource Strain: Not on file  Food Insecurity: Not on file  Transportation Needs: Not on file  Physical Activity: Not on file  Stress: Not on file  Social Connections: Not on file  Intimate Partner Violence: Not on file     Review of Systems: All other systems reviewed and are otherwise negative except as noted above.  Physical Exam: Vitals:   05/03/21 1156  BP: 122/82  Pulse: 69  SpO2: 95%  Weight: 245 lb 3.2 oz (111.2 kg)  Height: 6' (1.829 m)    GEN- The patient is well appearing, alert and oriented x 3 today.   HEENT: normocephalic, atraumatic; sclera clear, conjunctiva pink; hearing intact; oropharynx clear; neck supple, no JVP Lymph- no cervical lymphadenopathy Lungs- Clear to ausculation bilaterally, normal work of breathing.  No wheezes, rales, rhonchi Heart- Regular rate and rhythm, no murmurs, rubs or gallops, PMI not laterally displaced GI- soft, non-tender, non-distended, bowel sounds present, no hepatosplenomegaly Extremities- no clubbing, cyanosis, or edema; DP/PT/radial pulses 2+ bilaterally MS- no significant deformity or atrophy Skin- warm and dry, no rash or lesion Psych- euthymic mood, full affect Neuro- strength and sensation are intact  EKG is ordered. Personal review of EKG from today shows NSR and PACs at 69 bpm  Additional studies reviewed include: Previous EP office notes.    Assessment and Plan:  1. Persistent AF s/p ablation 08/23/20 EKG today shows NSR with PACs Continues to have intermittent symptomatic AF On eliquis for CHA2DS2-VASc of at least 2 He is on flecainide 50 mg BID but is currently taking it once as needed, 2-3 times a week. Change lopressor to toprol 50 mg qhs to see if this gives him more relief throughout the day.  Does not want to consider tikosyn at this time due to admission requirement.  Does not want to consider re-ablation either due to his "crappy insurance."  2. HTN Stable on current regimen   3. NICM Echo 04/26/2021 with LVEF 45-50%. Only mildly decreased  Follow up with Dr. Rayann Heman in 3 months   Shirley Friar, PA-C  05/03/21 12:09 PM

## 2021-05-03 NOTE — Patient Instructions (Signed)
Medication Instructions:  Your physician has recommended you make the following change in your medication:   DISCONTINUE: Metoprolol Tartrate START: Metoprolol Succinate 50mg  daily at bedtime  *If you need a refill on your cardiac medications before your next appointment, please call your pharmacy*   Lab Work: None If you have labs (blood work) drawn today and your tests are completely normal, you will receive your results only by: [place] (if you have MyChart) OR A paper copy in the mail If you have any lab test that is abnormal or we need to change your treatment, we will call you to review the results.   Follow-Up: At [facility], you and your health needs are our priority.  As part of our continuing mission to provide you with exceptional heart care, we have created designated Provider Care Teams.  These Care Teams include your primary Cardiologist (physician) and Advanced Practice Providers (APPs -  Physician Assistants and Nurse Practitioners) who all work together to provide you with the care you need, when you need it.   Your next appointment:   3 month(s)  The format for your next appointment:   In Person  Provider:   You may see [name], MD or one of the following Advanced Practice Providers on your designated Care Team:   Tommye Standard, [place] Legrand Como "[facility]" [place], [place]

## 2021-06-07 ENCOUNTER — Ambulatory Visit (INDEPENDENT_AMBULATORY_CARE_PROVIDER_SITE_OTHER): Payer: 59 | Admitting: Internal Medicine

## 2021-06-07 ENCOUNTER — Encounter: Payer: Self-pay | Admitting: Internal Medicine

## 2021-06-07 ENCOUNTER — Other Ambulatory Visit: Payer: Self-pay

## 2021-06-07 VITALS — BP 130/84 | HR 50 | Ht 72.0 in | Wt 247.8 lb

## 2021-06-07 DIAGNOSIS — I428 Other cardiomyopathies: Secondary | ICD-10-CM

## 2021-06-07 DIAGNOSIS — I10 Essential (primary) hypertension: Secondary | ICD-10-CM

## 2021-06-07 DIAGNOSIS — I4819 Other persistent atrial fibrillation: Secondary | ICD-10-CM

## 2021-06-07 MED ORDER — METOPROLOL SUCCINATE ER 50 MG PO TB24: 50.0000 mg | ORAL_TABLET | Freq: Every day | ORAL | 3 refills | Status: DC

## 2021-06-07 NOTE — Progress Notes (Signed)
PCP: [name], MD   Primary EP: Dr Harlow Ohms is a 60 y.o. male who presents today for routine electrophysiology followup.  Since last being seen in our clinic, the patient reports doing very well.  His afib is much better.  Typically having < 1 episode per week and episodes last < 1 hour.  Today, he denies symptoms of palpitations, chest pain, shortness of breath,  lower extremity edema, dizziness, presyncope, or syncope.  The patient is otherwise without complaint today.   Past Medical History:  Diagnosis Date   Atrial flutter [facility])    s/p CTI ablation by Dr [name]   Atypical nevus 04/16/2016   left outer back (mild)    Hypertension    Melanoma ([place])    stage I,  resected.  did not require chemotherapy or radiation   Persistent atrial fibrillation [facility])    Past Surgical History:  Procedure Laterality Date   A-FLUTTER ABLATION N/A 03/05/2018   Procedure: A-FLUTTER ABLATION;  Surgeon: [name], MD;  Location: [place] CV LAB;  Service: Cardiovascular;  Laterality: N/A;   ATRIAL FIBRILLATION ABLATION N/A 08/23/2020   Procedure: ATRIAL FIBRILLATION ABLATION;  Surgeon: [name], MD;  Location: [place] CV LAB;  Service: Cardiovascular;  Laterality: N/A;   CARDIOVERSION N/A 11/27/2017   Procedure: CARDIOVERSION;  Surgeon: [name], MD;  Location: [place] ENDOSCOPY;  Service: Cardiovascular;  Laterality: N/A;   CARDIOVERSION N/A 03/18/2019   Procedure: CARDIOVERSION;  Surgeon: [name], MD;  Location: [facility] ENDOSCOPY;  Service: Cardiovascular;  Laterality: N/A;   CARDIOVERSION N/A 07/18/2020   Procedure: CARDIOVERSION;  Surgeon: [name], MD;  Location: [facility] ENDOSCOPY;  Service: Cardiovascular;  Laterality: N/A;   CARDIOVERSION N/A 01/18/2021   Procedure: CARDIOVERSION;  Surgeon: [name], MD;  Location: [place];  Service: Cardiovascular;  Laterality: N/A;   KNEE ARTHROSCOPY     TEE WITHOUT CARDIOVERSION N/A 11/27/2017    Procedure: TRANSESOPHAGEAL ECHOCARDIOGRAM (TEE);  Surgeon: [name], MD;  Location: [facility] ENDOSCOPY;  Service: Cardiovascular;  Laterality: N/A;   TEE WITHOUT CARDIOVERSION N/A 03/18/2019   Procedure: TRANSESOPHAGEAL ECHOCARDIOGRAM (TEE);  Surgeon: [name], MD;  Location: [facility] ENDOSCOPY;  Service: Cardiovascular;  Laterality: N/A;    ROS- all systems are reviewed and negatives except as per HPI above  Current Outpatient Medications  Medication Sig Dispense Refill   amLODipine (NORVASC) 5 MG tablet Take 5 mg by mouth at bedtime.  3   apixaban (ELIQUIS) 5 MG TABS tablet Take 1 tablet (5 mg total) by mouth 2 (two) times daily. 180 tablet 1   flecainide (TAMBOCOR) 50 MG tablet Take 1 tablet (50 mg total) by mouth 2 (two) times daily. 180 tablet 3   losartan (COZAAR) 50 MG tablet Take 50 mg by mouth in the morning and at bedtime.  3   MAGNESIUM PO Take 1 tablet by mouth every other day.     metoprolol succinate (TOPROL-XL) 50 MG 24 hr tablet Take 1 tablet (50 mg total) by mouth at bedtime. Take with or immediately following a meal. 90 tablet 3   multivitamin (THERAGRAN) per tablet Take 1 tablet by mouth every other day.     tadalafil (CIALIS) 10 MG tablet Take 10 mg by mouth daily.     testosterone cypionate (DEPOTESTOSTERONE CYPIONATE) 200 MG/ML injection Inject 200 mg into the muscle every 14 (fourteen) days.     No current facility-administered medications for this visit.    Physical Exam:  Vitals:   06/07/21 1210  BP: 130/84  Pulse: (!) 50  SpO2: 95%  Weight: 247 lb 12.8 oz (112.4 kg)  Height: 6' (1.829 m)    GEN- The patient is well appearing, alert and oriented x 3 today.   Head- normocephalic, atraumatic Eyes-  Sclera clear, conjunctiva pink Ears- hearing intact Oropharynx- clear Lungs- Clear to ausculation bilaterally, normal work of breathing Heart- Regular rate and rhythm, no murmurs, rubs or gallops, PMI not laterally displaced GI- soft, NT, ND, +  BS Extremities- no clubbing, cyanosis, or edema  Wt Readings from Last 3 Encounters:  06/07/21 247 lb 12.8 oz (112.4 kg)  05/03/21 245 lb 3.2 oz (111.2 kg)  04/10/21 245 lb 9.6 oz (111.4 kg)    EKG tracing ordered today is personally reviewed and shows low atrial rhythm, PR 128 msec, heart rate 50 bpm, PACs  Assessment and Plan:  Persistent afib Much improved.  Only taking flecainide daily at times. Chads2vasc score is 2.  He is on eliquis He continues to wish to avoid ablation given insurance issues.  2. Nonischemic CM Likely tachycardia mediated Hopefully will recover with sinus Repeat echo on return in 3 months  3. HTN Stable No change required today   Risks, benefits and potential toxicities for medications prescribed and/or refilled reviewed with patient today.   Return to see EP APP in 3 months  [name] MD, [facility] 06/07/2021 12:28 PM

## 2021-06-07 NOTE — Patient Instructions (Addendum)
Medication Instructions:  Your physician recommends that you continue on your current medications as directed. Please refer to the Current Medication list given to you today. *If you need a refill on your cardiac medications before your next appointment, please call your pharmacy*  Lab Work: None. If you have labs (blood work) drawn today and your tests are completely normal, you will receive your results only by: [place] (if you have MyChart) OR A paper copy in the mail If you have any lab test that is abnormal or we need to change your treatment, we will call you to review the results.  Testing/Procedures: schedule prior to follow up Your physician has requested that you have an echocardiogram. Echocardiography is a painless test that uses sound waves to create images of your heart. It provides your doctor with information about the size and shape of your heart and how well your heart's chambers and valves are working. This procedure takes approximately one hour. There are no restrictions for this procedure.   Follow-Up: At [facility], you and your health needs are our priority.  As part of our continuing mission to provide you with exceptional heart care, we have created designated Provider Care Teams.  These Care Teams include your primary Cardiologist (physician) and Advanced Practice Providers (APPs -  Physician Assistants and Nurse Practitioners) who all work together to provide you with the care you need, when you need it.  Your physician wants you to follow-up in: 3 months with one of the following Advanced Practice Providers on your designated Care Team:    Michael Choi, [place]   You will receive a reminder letter in the mail two months in advance. If you don't receive a letter, please call our office to schedule the follow-up appointment.  We recommend signing up for the patient portal called "MyChart".  Sign up information is provided on this After Visit  Summary.  MyChart is used to connect with patients for Virtual Visits (Telemedicine).  Patients are able to view lab/test results, encounter notes, upcoming appointments, etc.  Non-urgent messages can be sent to your provider as well.   To learn more about what you can do with MyChart, go to [email or web address].    Any Other Special Instructions Will Be Listed Below (If Applicable).

## 2021-06-21 ENCOUNTER — Telehealth: Payer: Self-pay | Admitting: Internal Medicine

## 2021-06-21 NOTE — Telephone Encounter (Signed)
Pt c/o Syncope: STAT if syncope occurred within 30 minutes and pt complains of lightheadedness High Priority if episode of passing out, completely, today or in last 24 hours   Did you pass out today? no   When is the last time you passed out? yesterday   Has this occurred multiple times? Happened two times yesterday.   Did you have any symptoms prior to passing out? No symptoms, states he was sleeping, he doesn't even remember getting off the couch.     They happened about 90 mins apart. He currently has COVID. He is currently at his PCP's office.

## 2021-06-24 NOTE — Telephone Encounter (Signed)
Lm to call back ./cy 

## 2021-06-25 NOTE — Telephone Encounter (Signed)
Left message to call back  

## 2021-06-28 ENCOUNTER — Other Ambulatory Visit (HOSPITAL_COMMUNITY): Payer: 59

## 2021-06-28 ENCOUNTER — Encounter (HOSPITAL_COMMUNITY): Payer: Self-pay | Admitting: Internal Medicine

## 2021-06-28 NOTE — Telephone Encounter (Signed)
Left message to call back  

## 2021-07-01 ENCOUNTER — Encounter: Payer: Self-pay | Admitting: *Deleted

## 2021-07-01 NOTE — Telephone Encounter (Signed)
Letter sent over mychart and in the mail.

## 2021-09-03 ENCOUNTER — Other Ambulatory Visit: Payer: Self-pay

## 2021-09-03 DIAGNOSIS — I4819 Other persistent atrial fibrillation: Secondary | ICD-10-CM

## 2021-09-03 MED ORDER — APIXABAN 5 MG PO TABS: 5.0000 mg | ORAL_TABLET | Freq: Two times a day (BID) | ORAL | 1 refills | Status: DC

## 2021-09-03 NOTE — Telephone Encounter (Signed)
Eliquis 5 mg refill request received. Patient is 61 years old, weight- 112.4 kg, Crea- 1.45 on 08/10/21, Diagnosis- afib, and last seen by Dr. Rayann Heman on 06/07/21. Dose is appropriate based on dosing criteria. Will send in refill to requested pharmacy.

## 2021-09-19 NOTE — Progress Notes (Deleted)
? ? ?Office Visit  ?  ?Patient Name: Michael Choi ?Date of Encounter: 09/19/2021 ? ?Primary Care Provider:  [name], MD ?Primary Cardiologist:  None ? ?Chief Complaint  ? ?6 month follow up for AF and Flecainide ? ? ?History of Present Illness  ?  ?Michael Choi is a 61 y.o. male seen today for Michael Grayer, MD for. He has a PMH of Michael flutter s/p ablation in 8/19. NICM,HTN,OSA, and persistent AF ( CHADS2VASC 2).Echo completed 10/22 with EF of 45-50% with LV hypokinesis with Grade II DD.He was DCCV 12/21 and again 7/22 for AF.He reported frequent palpitations with shortness of breath and fatigue.He was started on flecainide 9/22 and had complaints of headaches and is taking it now once as needed.He wants to avoid having another AF ablation or trying Tikosyn due to hospital admission. ? ? ?Sincelast being seen in our clinic the patient reports doing ***.  he denies chest Choi, palpitations, dyspnea, PND, orthopnea, nausea, vomiting, dizziness, syncope, edema, weight gain, or early satiety. ? ? ? ? ?Past Medical History  ?  ?Past Medical History:  ?Diagnosis Date  ? Michael Choi)   ? s/p CTI ablation by Dr Michael Choi  ? Atypical nevus 04/16/2016  ? left outer back (mild)   ? Hypertension   ? Melanoma ([place])   ? stage I,  resected.  did not require chemotherapy or radiation  ? Persistent Michael fibrillation ([place])   ? ?Past Surgical History:  ?Procedure Laterality Date  ? A-FLUTTER ABLATION N/A 03/05/2018  ? Procedure: A-FLUTTER ABLATION;  Surgeon: Michael Sprang, MD;  Location: [place] [facility];  Service: Cardiovascular;  Laterality: N/A;  ? Michael FIBRILLATION ABLATION N/A 08/23/2020  ? Procedure: Michael FIBRILLATION ABLATION;  Surgeon: Michael Grayer, MD;  Location: [place] [facility];  Service: Cardiovascular;  Laterality: N/A;  ? CARDIOVERSION N/A 11/27/2017  ? Procedure: CARDIOVERSION;  Surgeon: Michael Mormon, MD;  Location: [place];  Service: Cardiovascular;  Laterality: N/A;  ? CARDIOVERSION N/A  03/18/2019  ? Procedure: CARDIOVERSION;  Surgeon: Michael Perla, MD;  Location: [facility] ENDOSCOPY;  Service: Cardiovascular;  Laterality: N/A;  ? CARDIOVERSION N/A 07/18/2020  ? Procedure: CARDIOVERSION;  Surgeon: Michael Pain, MD;  Location: [facility] ENDOSCOPY;  Service: Cardiovascular;  Laterality: N/A;  ? CARDIOVERSION N/A 01/18/2021  ? Procedure: CARDIOVERSION;  Surgeon: Michael Rile, MD;  Location: [place];  Service: Cardiovascular;  Laterality: N/A;  ? KNEE ARTHROSCOPY    ? TEE WITHOUT CARDIOVERSION N/A 11/27/2017  ? Procedure: TRANSESOPHAGEAL ECHOCARDIOGRAM (TEE);  Surgeon: Michael Mormon, MD;  Location: [place];  Service: Cardiovascular;  Laterality: N/A;  ? TEE WITHOUT CARDIOVERSION N/A 03/18/2019  ? Procedure: TRANSESOPHAGEAL ECHOCARDIOGRAM (TEE);  Surgeon: Michael Perla, MD;  Location: [facility] ENDOSCOPY;  Service: Cardiovascular;  Laterality: N/A;  ? ? ?Allergies ? ?Allergies  ?Allergen Reactions  ? Doxycycline Hives, Swelling and Other (See Comments)  ?  "I felt a little swelling in my throat," so he ceased taking it  ? ? ?Home Medications  ?  ?Current Outpatient Medications  ?Medication Sig Dispense Refill  ? amLODipine (NORVASC) 5 MG tablet Take 5 mg by mouth at bedtime.  3  ? apixaban (ELIQUIS) 5 MG TABS tablet Take 1 tablet (5 mg total) by mouth 2 (two) times daily. 180 tablet 1  ? flecainide (TAMBOCOR) 50 MG tablet Take 1 tablet (50 mg total) by mouth 2 (two) times daily. 180 tablet 3  ? losartan (COZAAR) 50 MG tablet Take  50 mg by mouth in the morning and at bedtime.  3  ? MAGNESIUM PO Take 1 tablet by mouth every other day.    ? metoprolol succinate (TOPROL-XL) 50 MG 24 hr tablet Take 1 tablet (50 mg total) by mouth at bedtime. Take with or immediately following a meal. 90 tablet 3  ? multivitamin (THERAGRAN) per tablet Take 1 tablet by mouth every other day.    ? tadalafil (CIALIS) 10 MG tablet Take 10 mg by mouth daily.    ? testosterone cypionate (DEPOTESTOSTERONE CYPIONATE) 200  MG/ML injection Inject 200 mg into the muscle every 14 (fourteen) days.    ? ?No current facility-administered medications for this visit.  ?  ? ?Review of Systems  ?All other systems reviewed and are otherwise negative except as noted above. ? ? ?Physical Exam  ?  ?YM:EBRAX were no vitals filed for this visit.  ? ?GEN: Well nourished, well developed, in no acute distress. ?Neck: Supple, no JVD, carotid bruits, or masses. ?Cardiac: RRR, no murmurs, rubs, or gallops. No clubbing, cyanosis, edema.  Radials/PT 2+ and equal bilaterally.  ?Respiratory:  Respirations regular and unlabored, clear to auscultation bilaterally. ?MS: no deformity or atrophy. ?Skin: warm and dry, no rash. ?Neuro:  Strength and sensation are intact. ?Psych: Normal affect. ? ?Accessory Clinical Findings  ?  ?ECG personally reviewed by me today - *** rate of ***- no acute changes. ? ?Lab Results  ?Component Value Date  ? WBC 7.0 08/10/2020  ? HGB 17.9 (H) 08/10/2020  ? HCT 52.4 (H) 08/10/2020  ? MCV 93 08/10/2020  ? PLT 244 08/10/2020  ? ?Lab Results  ?Component Value Date  ? CREATININE 1.45 (H) 08/10/2020  ? BUN 17 08/10/2020  ? NA 139 08/10/2020  ? K 4.8 08/10/2020  ? CL 101 08/10/2020  ? CO2 26 08/10/2020  ? ?Lab Results  ?Component Value Date  ? ALT 72 (H) 10/05/2019  ? AST 49 (H) 10/05/2019  ? ALKPHOS 44 10/05/2019  ? BILITOT 0.9 10/05/2019  ? ?Lab Results  ?Component Value Date  ? CHOL 158 05/19/2018  ? HDL 44 05/19/2018  ? LDLCALC 100 (H) 05/19/2018  ? LDLDIRECT 132.3 (H) 10/05/2019  ? TRIG 71 05/19/2018  ? CHOLHDL 3.6 05/19/2018  ?  ?No results found for: HGBA1C ? ?Assessment & Plan  ?  ?1. Persistent Michael fibrillation/Michael flutter: ?-s/p AF ablation on 2/22 ?- Continue Eliquis ?-Continue flecainide ?-EKG only today ? ?2.HTN: ?-Blood pressure controlled today at *** ?-Continue losartan and amlodipine ? ?3.Nonischemic Cardiomyopathy: ?-EF 45-50% by last echo.  ?-No signs or symptoms of fluid overload today. ?  ?Disposition: Follow-up  with Dr. Marland Kitchenor  APP*** in *** month/year ? ?{Are you ordering a CV Procedure (e.g. stress test, cath, DCCV, TEE, etc)?   Press F2        :[id number]}  ? ?Medication Adjustments/Labs and Tests Ordered: ?Current medicines are reviewed at length with the patient today.  Concerns regarding medicines are outlined above.  ?Tests Ordered: ?No orders of the defined types were placed in this encounter. ? ?Medication Changes: ?No orders of the defined types were placed in this encounter. ? ? ? ?Mable Fill, Marissa Nestle, NP ?09/19/2021, 1:23 PM ?    ?

## 2021-09-19 NOTE — Progress Notes (Deleted)
? ? ?Office Visit  ?  ?Patient Name: Michael Choi ?Date of Encounter: 09/19/2021 ? ?Primary Care Provider:  [name], MD ?Primary Cardiologist:  None ? ?Chief Complaint  ?  ?*** ? ?History of Present Illness  ?  ?Michael Choi is a 61 y.o. male seen today for [name], MD for .  Since {Blank single:19197::"last being seen in our clinic","discharge from hospital"} the patient reports doing ***.  he denies chest pain, palpitations, dyspnea, PND, orthopnea, nausea, vomiting, dizziness, syncope, edema, weight gain, or early satiety. ? ? ? ? ?Past Medical History  ?  ?Past Medical History:  ?Diagnosis Date  ? Atrial flutter [facility])   ? s/p CTI ablation by Dr [name]  ? Atypical nevus 04/16/2016  ? left outer back (mild)   ? Hypertension   ? Melanoma ([place])   ? stage I,  resected.  did not require chemotherapy or radiation  ? Persistent atrial fibrillation ([place])   ? ?Past Surgical History:  ?Procedure Laterality Date  ? A-FLUTTER ABLATION N/A 03/05/2018  ? Procedure: A-FLUTTER ABLATION;  Surgeon: [name], MD;  Location: [place] [facility];  Service: Cardiovascular;  Laterality: N/A;  ? ATRIAL FIBRILLATION ABLATION N/A 08/23/2020  ? Procedure: ATRIAL FIBRILLATION ABLATION;  Surgeon: [name], MD;  Location: [place] [facility];  Service: Cardiovascular;  Laterality: N/A;  ? CARDIOVERSION N/A 11/27/2017  ? Procedure: CARDIOVERSION;  Surgeon: [name], MD;  Location: [place];  Service: Cardiovascular;  Laterality: N/A;  ? CARDIOVERSION N/A 03/18/2019  ? Procedure: CARDIOVERSION;  Surgeon: [name], MD;  Location: [facility] ENDOSCOPY;  Service: Cardiovascular;  Laterality: N/A;  ? CARDIOVERSION N/A 07/18/2020  ? Procedure: CARDIOVERSION;  Surgeon: [name], MD;  Location: [facility] ENDOSCOPY;  Service: Cardiovascular;  Laterality: N/A;  ? CARDIOVERSION N/A 01/18/2021  ? Procedure: CARDIOVERSION;  Surgeon: [name], MD;  Location: [place];  Service: Cardiovascular;  Laterality: N/A;  ?  KNEE ARTHROSCOPY    ? TEE WITHOUT CARDIOVERSION N/A 11/27/2017  ? Procedure: TRANSESOPHAGEAL ECHOCARDIOGRAM (TEE);  Surgeon: [name], MD;  Location: [place];  Service: Cardiovascular;  Laterality: N/A;  ? TEE WITHOUT CARDIOVERSION N/A 03/18/2019  ? Procedure: TRANSESOPHAGEAL ECHOCARDIOGRAM (TEE);  Surgeon: [name], MD;  Location: [facility] ENDOSCOPY;  Service: Cardiovascular;  Laterality: N/A;  ? ? ?Allergies ? ?Allergies  ?Allergen Reactions  ? Doxycycline Hives, Swelling and Other (See Comments)  ?  "I felt a little swelling in my throat," so he ceased taking it  ? ? ?Home Medications  ?  ?Current Outpatient Medications  ?Medication Sig Dispense Refill  ? amLODipine (NORVASC) 5 MG tablet Take 5 mg by mouth at bedtime.  3  ? apixaban (ELIQUIS) 5 MG TABS tablet Take 1 tablet (5 mg total) by mouth 2 (two) times daily. 180 tablet 1  ? flecainide (TAMBOCOR) 50 MG tablet Take 1 tablet (50 mg total) by mouth 2 (two) times daily. 180 tablet 3  ? losartan (COZAAR) 50 MG tablet Take 50 mg by mouth in the morning and at bedtime.  3  ? MAGNESIUM PO Take 1 tablet by mouth every other day.    ? metoprolol succinate (TOPROL-XL) 50 MG 24 hr tablet Take 1 tablet (50 mg total) by mouth at bedtime. Take with or immediately following a meal. 90 tablet 3  ? multivitamin (THERAGRAN) per tablet Take 1 tablet by mouth every other day.    ? tadalafil (CIALIS) 10 MG tablet Take 10 mg by  mouth daily.    ? testosterone cypionate (DEPOTESTOSTERONE CYPIONATE) 200 MG/ML injection Inject 200 mg into the muscle every 14 (fourteen) days.    ? ?No current facility-administered medications for this visit.  ?  ? ?Review of Systems  ?ROS  ? ?Physical Exam  ?  ?VS:  There were no vitals taken for this visit. , BMI There is no height or weight on file to calculate BMI. ?    ?GEN: Well nourished, well developed, in no acute distress. ?Neck: Supple, no JVD, carotid bruits, or masses. ?Cardiac: RRR, no murmurs, rubs, or gallops. No  clubbing, cyanosis, edema.  Radials/PT 2+ and equal bilaterally.  ?Respiratory:  Respirations regular and unlabored, clear to auscultation bilaterally. ?MS: no deformity or atrophy. ?Skin: warm and dry, no rash. ?Neuro:  Strength and sensation are intact. ?Psych: Normal affect. ? ?Accessory Clinical Findings  ?  ?ECG personally reviewed by me today - *** - no acute changes. ? ?Lab Results  ?Component Value Date  ? WBC 7.0 08/10/2020  ? HGB 17.9 (H) 08/10/2020  ? HCT 52.4 (H) 08/10/2020  ? MCV 93 08/10/2020  ? PLT 244 08/10/2020  ? ?Lab Results  ?Component Value Date  ? CREATININE 1.45 (H) 08/10/2020  ? BUN 17 08/10/2020  ? NA 139 08/10/2020  ? K 4.8 08/10/2020  ? CL 101 08/10/2020  ? CO2 26 08/10/2020  ? ?Lab Results  ?Component Value Date  ? ALT 72 (H) 10/05/2019  ? AST 49 (H) 10/05/2019  ? ALKPHOS 44 10/05/2019  ? BILITOT 0.9 10/05/2019  ? ?Lab Results  ?Component Value Date  ? CHOL 158 05/19/2018  ? HDL 44 05/19/2018  ? LDLCALC 100 (H) 05/19/2018  ? LDLDIRECT 132.3 (H) 10/05/2019  ? TRIG 71 05/19/2018  ? CHOLHDL 3.6 05/19/2018  ?  ?No results found for: HGBA1C ? ?Assessment & Plan  ?  ?1. *** ? ?2.*** ? ?3.*** ? ?4.*** ? ?5.*** ? ?Disposition: Follow-up with Dr. Marland Kitchenor  APP*** in *** month/year ? ?{Are you ordering a CV Procedure (e.g. stress test, cath, DCCV, TEE, etc)?   Press F2        :[id number]}  ? ?Medication Adjustments/Labs and Tests Ordered: ?Current medicines are reviewed at length with the patient today.  Concerns regarding medicines are outlined above.  ?Tests Ordered: ?No orders of the defined types were placed in this encounter. ? ?Medication Changes: ?No orders of the defined types were placed in this encounter. ? ? ? ?Mable Fill, Marissa Nestle, NP ?09/19/2021, 1:08 PM ?    ?

## 2021-09-19 NOTE — Progress Notes (Deleted)
? ? ?Office Visit  ?  ?Patient Name: NATASHA PAULSON ?Date of Encounter: 09/19/2021 ? ?Primary Care Provider:  [name], MD ?Primary Cardiologist:  None ? ?Chief Complaint  ?  ?*** ? ?History of Present Illness  ?  ?DIAGO HAIK is a 61 y.o. male seen today for [name], MD for .  Since {Blank single:19197::"last being seen in our clinic","discharge from hospital"} the patient reports doing ***.  he denies chest pain, palpitations, dyspnea, PND, orthopnea, nausea, vomiting, dizziness, syncope, edema, weight gain, or early satiety. ? ? ? ? ?Past Medical History  ?  ?Past Medical History:  ?Diagnosis Date  ? Atrial flutter [facility])   ? s/p CTI ablation by Dr [name]  ? Atypical nevus 04/16/2016  ? left outer back (mild)   ? Hypertension   ? Melanoma ([place])   ? stage I,  resected.  did not require chemotherapy or radiation  ? Persistent atrial fibrillation ([place])   ? ?Past Surgical History:  ?Procedure Laterality Date  ? A-FLUTTER ABLATION N/A 03/05/2018  ? Procedure: A-FLUTTER ABLATION;  Surgeon: [name], MD;  Location: [place] [facility];  Service: Cardiovascular;  Laterality: N/A;  ? ATRIAL FIBRILLATION ABLATION N/A 08/23/2020  ? Procedure: ATRIAL FIBRILLATION ABLATION;  Surgeon: [name], MD;  Location: [place] [facility];  Service: Cardiovascular;  Laterality: N/A;  ? CARDIOVERSION N/A 11/27/2017  ? Procedure: CARDIOVERSION;  Surgeon: [name], MD;  Location: [place];  Service: Cardiovascular;  Laterality: N/A;  ? CARDIOVERSION N/A 03/18/2019  ? Procedure: CARDIOVERSION;  Surgeon: [name], MD;  Location: [facility] ENDOSCOPY;  Service: Cardiovascular;  Laterality: N/A;  ? CARDIOVERSION N/A 07/18/2020  ? Procedure: CARDIOVERSION;  Surgeon: [name], MD;  Location: [facility] ENDOSCOPY;  Service: Cardiovascular;  Laterality: N/A;  ? CARDIOVERSION N/A 01/18/2021  ? Procedure: CARDIOVERSION;  Surgeon: [name], MD;  Location: [place];  Service: Cardiovascular;  Laterality: N/A;  ?  KNEE ARTHROSCOPY    ? TEE WITHOUT CARDIOVERSION N/A 11/27/2017  ? Procedure: TRANSESOPHAGEAL ECHOCARDIOGRAM (TEE);  Surgeon: [name], MD;  Location: [place];  Service: Cardiovascular;  Laterality: N/A;  ? TEE WITHOUT CARDIOVERSION N/A 03/18/2019  ? Procedure: TRANSESOPHAGEAL ECHOCARDIOGRAM (TEE);  Surgeon: [name], MD;  Location: [facility] ENDOSCOPY;  Service: Cardiovascular;  Laterality: N/A;  ? ? ?Allergies ? ?Allergies  ?Allergen Reactions  ? Doxycycline Hives, Swelling and Other (See Comments)  ?  "I felt a little swelling in my throat," so he ceased taking it  ? ? ?Home Medications  ?  ?Current Outpatient Medications  ?Medication Sig Dispense Refill  ? amLODipine (NORVASC) 5 MG tablet Take 5 mg by mouth at bedtime.  3  ? apixaban (ELIQUIS) 5 MG TABS tablet Take 1 tablet (5 mg total) by mouth 2 (two) times daily. 180 tablet 1  ? flecainide (TAMBOCOR) 50 MG tablet Take 1 tablet (50 mg total) by mouth 2 (two) times daily. 180 tablet 3  ? losartan (COZAAR) 50 MG tablet Take 50 mg by mouth in the morning and at bedtime.  3  ? MAGNESIUM PO Take 1 tablet by mouth every other day.    ? metoprolol succinate (TOPROL-XL) 50 MG 24 hr tablet Take 1 tablet (50 mg total) by mouth at bedtime. Take with or immediately following a meal. 90 tablet 3  ? multivitamin (THERAGRAN) per tablet Take 1 tablet by mouth every other day.    ? tadalafil (CIALIS) 10 MG tablet Take 10 mg by  mouth daily.    ? testosterone cypionate (DEPOTESTOSTERONE CYPIONATE) 200 MG/ML injection Inject 200 mg into the muscle every 14 (fourteen) days.    ? ?No current facility-administered medications for this visit.  ?  ? ?Review of Systems  ?ROS  ? ?Physical Exam  ?  ?WU:JWJXB were no vitals filed for this visit.  ? ?GEN: Well nourished, well developed, in no acute distress. ?Neck: Supple, no JVD, carotid bruits, or masses. ?Cardiac: RRR, no murmurs, rubs, or gallops. No clubbing, cyanosis, edema.  Radials/PT 2+ and equal bilaterally.   ?Respiratory:  Respirations regular and unlabored, clear to auscultation bilaterally. ?MS: no deformity or atrophy. ?Skin: warm and dry, no rash. ?Neuro:  Strength and sensation are intact. ?Psych: Normal affect. ? ?Accessory Clinical Findings  ?  ?ECG personally reviewed by me today - *** rate of ***- no acute changes. ? ?Lab Results  ?Component Value Date  ? WBC 7.0 08/10/2020  ? HGB 17.9 (H) 08/10/2020  ? HCT 52.4 (H) 08/10/2020  ? MCV 93 08/10/2020  ? PLT 244 08/10/2020  ? ?Lab Results  ?Component Value Date  ? CREATININE 1.45 (H) 08/10/2020  ? BUN 17 08/10/2020  ? NA 139 08/10/2020  ? K 4.8 08/10/2020  ? CL 101 08/10/2020  ? CO2 26 08/10/2020  ? ?Lab Results  ?Component Value Date  ? ALT 72 (H) 10/05/2019  ? AST 49 (H) 10/05/2019  ? ALKPHOS 44 10/05/2019  ? BILITOT 0.9 10/05/2019  ? ?Lab Results  ?Component Value Date  ? CHOL 158 05/19/2018  ? HDL 44 05/19/2018  ? LDLCALC 100 (H) 05/19/2018  ? LDLDIRECT 132.3 (H) 10/05/2019  ? TRIG 71 05/19/2018  ? CHOLHDL 3.6 05/19/2018  ?  ?No results found for: HGBA1C ? ?Assessment & Plan  ?  ?1. *** ? ?2.*** ? ?3.*** ? ?4.*** ? ?5.*** ? ?Disposition: Follow-up with Dr. Marland Kitchenor  APP*** in *** month/year ? ?{Are you ordering a CV Procedure (e.g. stress test, cath, DCCV, TEE, etc)?   Press F2        :[id number]}  ? ?Medication Adjustments/Labs and Tests Ordered: ?Current medicines are reviewed at length with the patient today.  Concerns regarding medicines are outlined above.  ?Tests Ordered: ?No orders of the defined types were placed in this encounter. ? ?Medication Changes: ?No orders of the defined types were placed in this encounter. ? ? ? ?Mable Fill, Marissa Nestle, NP ?09/19/2021, 1:11 PM ?    ?

## 2021-09-20 ENCOUNTER — Ambulatory Visit: Payer: 59 | Admitting: Nurse Practitioner

## 2021-09-20 DIAGNOSIS — I10 Essential (primary) hypertension: Secondary | ICD-10-CM

## 2021-09-20 DIAGNOSIS — I428 Other cardiomyopathies: Secondary | ICD-10-CM

## 2021-09-20 DIAGNOSIS — I4819 Other persistent atrial fibrillation: Secondary | ICD-10-CM

## 2022-03-09 ENCOUNTER — Other Ambulatory Visit: Payer: Self-pay | Admitting: Internal Medicine

## 2022-03-09 DIAGNOSIS — I4819 Other persistent atrial fibrillation: Secondary | ICD-10-CM

## 2022-03-10 NOTE — Telephone Encounter (Signed)
Prescription refill request for Eliquis received. Indication: Atrial Fib Last office visit: 06/07/21  Lenna Sciara Allred MD Scr:  1.53 on 05/10/21 Age: 61 Weight: 112.4kg  Based on above findings Eliquis '5mg'$  twice daily is the appropriate dose.  Refill approved.

## 2022-03-19 ENCOUNTER — Other Ambulatory Visit: Payer: Self-pay

## 2022-03-19 ENCOUNTER — Emergency Department (HOSPITAL_COMMUNITY): Payer: 59

## 2022-03-19 ENCOUNTER — Encounter (HOSPITAL_COMMUNITY): Payer: Self-pay | Admitting: Emergency Medicine

## 2022-03-19 ENCOUNTER — Observation Stay (HOSPITAL_COMMUNITY)
Admission: EM | Admit: 2022-03-19 | Discharge: 2022-03-20 | Disposition: A | Discharge status: Home / Self Care | Payer: 59 | Attending: Emergency Medicine | Admitting: Emergency Medicine

## 2022-03-19 DIAGNOSIS — R0789 Other chest pain: Secondary | ICD-10-CM | POA: Diagnosis present

## 2022-03-19 DIAGNOSIS — Z79899 Other long term (current) drug therapy: Secondary | ICD-10-CM | POA: Diagnosis not present

## 2022-03-19 DIAGNOSIS — I4819 Other persistent atrial fibrillation: Secondary | ICD-10-CM | POA: Diagnosis present

## 2022-03-19 DIAGNOSIS — I10 Essential (primary) hypertension: Secondary | ICD-10-CM | POA: Diagnosis not present

## 2022-03-19 DIAGNOSIS — R55 Syncope and collapse: Secondary | ICD-10-CM

## 2022-03-19 DIAGNOSIS — I480 Paroxysmal atrial fibrillation: Secondary | ICD-10-CM

## 2022-03-19 DIAGNOSIS — Z7901 Long term (current) use of anticoagulants: Secondary | ICD-10-CM | POA: Diagnosis not present

## 2022-03-19 DIAGNOSIS — R079 Chest pain, unspecified: Secondary | ICD-10-CM | POA: Diagnosis not present

## 2022-03-19 DIAGNOSIS — I4811 Longstanding persistent atrial fibrillation: Principal | ICD-10-CM | POA: Insufficient documentation

## 2022-03-19 LAB — CBC
HCT: 49.4 % (ref 39.0–52.0)
Hemoglobin: 17.1 g/dL — ABNORMAL HIGH (ref 13.0–17.0)
MCH: 32 pg (ref 26.0–34.0)
MCHC: 34.6 g/dL (ref 30.0–36.0)
MCV: 92.5 fL (ref 80.0–100.0)
Platelets: 226 10*3/uL (ref 150–400)
RBC: 5.34 MIL/uL (ref 4.22–5.81)
RDW: 13.2 % (ref 11.5–15.5)
WBC: 6.9 10*3/uL (ref 4.0–10.5)
nRBC: 0 % (ref 0.0–0.2)

## 2022-03-19 LAB — BASIC METABOLIC PANEL
Anion gap: 12 (ref 5–15)
BUN: 14 mg/dL (ref 8–23)
CO2: 24 mmol/L (ref 22–32)
Calcium: 9.4 mg/dL (ref 8.9–10.3)
Chloride: 104 mmol/L (ref 98–111)
Creatinine, Ser: 1.25 mg/dL — ABNORMAL HIGH (ref 0.61–1.24)
GFR, Estimated: >60 mL/min (ref >60)
Glucose, Bld: 115 mg/dL — ABNORMAL HIGH (ref 70–99)
Potassium: 3.9 mmol/L (ref 3.5–5.1)
Sodium: 140 mmol/L (ref 135–145)

## 2022-03-19 LAB — TROPONIN I (HIGH SENSITIVITY): Troponin I (High Sensitivity): 14 ng/L (ref <18)

## 2022-03-19 MED ORDER — APIXABAN 5 MG PO TABS
5.0000 mg | ORAL_TABLET | Freq: Two times a day (BID) | ORAL | Status: DC
  Administered 2022-03-20 (×2): 5 mg via ORAL
  Filled 2022-03-19 (×2): qty 1

## 2022-03-19 MED ORDER — ACETAMINOPHEN 325 MG PO TABS: 650.0000 mg | ORAL_TABLET | ORAL | Status: DC | PRN

## 2022-03-19 MED ORDER — METOPROLOL SUCCINATE ER 50 MG PO TB24: 50.0000 mg | ORAL_TABLET | Freq: Every day | ORAL | Status: DC

## 2022-03-19 MED ORDER — ONDANSETRON HCL 4 MG/2ML IJ SOLN: 4.0000 mg | Freq: Four times a day (QID) | INTRAMUSCULAR | Status: DC | PRN

## 2022-03-19 MED ORDER — FLECAINIDE ACETATE 50 MG PO TABS
50.0000 mg | ORAL_TABLET | Freq: Two times a day (BID) | ORAL | Status: DC
  Administered 2022-03-20 (×2): 50 mg via ORAL
  Filled 2022-03-19 (×3): qty 1

## 2022-03-19 MED ORDER — ASPIRIN 81 MG PO CHEW
324.0000 mg | CHEWABLE_TABLET | Freq: Once | ORAL | Status: AC
  Administered 2022-03-19: 324 mg via ORAL
  Filled 2022-03-19: qty 4

## 2022-03-19 NOTE — Assessment & Plan Note (Addendum)
HEART score = 3 Not really impressive for ischemic CP, more wondering if him going in and out of A.Fib is related? 1. CP obs pathway 2. EDP spoke with cards who will see in AM 3. Serial trops - 1st trop neg 4. Tele monitor 5. NPO after MN

## 2022-03-19 NOTE — ED Provider Notes (Signed)
[facility] EMERGENCY DEPARTMENT Provider Note   CSN: [id number] Arrival date & time: 03/19/22  2001     History  Chief Complaint  Patient presents with   Atrial Fibrillation    Michael Choi is a 61 y.o. male.  HPI Patient with a history of A-fib, hypertension presents with chest pain.  Patient seems to be taking his medication regularly, though there was possibly some gaps in his Eliquis dosing.  He has been taking flecainide and beta-blocker, seemingly more regularly. Today, just prior to ED arrival the patient felt left-sided chest pain which he notes is different from prior episodes of rapid A-fib.  No dyspnea, symptoms are worse with motion.  No abdominal pain nausea, vomiting.  Patient's history of A-fib is notable for multiple cardioversions, ablations, but with refractory A-fib and spite of those and ongoing medication use.  Last echocardiogram was about 1 year ago.    Home Medications Prior to Admission medications   Medication Sig Start Date End Date Taking? Authorizing Provider  amLODipine (NORVASC) 5 MG tablet Take 5 mg by mouth at bedtime. 12/31/16   [provider]  ELIQUIS 5 MG TABS tablet TAKE 1 TABLET BY MOUTH TWICE A DAY 03/10/22   Allred, Jeneen Rinks, MD  flecainide (TAMBOCOR) 50 MG tablet Take 1 tablet (50 mg total) by mouth 2 (two) times daily. 04/10/21   Allred, Jeneen Rinks, MD  losartan (COZAAR) 50 MG tablet Take 50 mg by mouth in the morning and at bedtime. 01/20/17   [provider]  MAGNESIUM PO Take 1 tablet by mouth every other day.    [provider]  metoprolol succinate (TOPROL-XL) 50 MG 24 hr tablet Take 1 tablet (50 mg total) by mouth at bedtime. Take with or immediately following a meal. 06/07/21   Allred, Jeneen Rinks, MD  multivitamin [facility]) per tablet Take 1 tablet by mouth every other day.    [provider]  tadalafil (CIALIS) 10 MG tablet Take 10 mg by mouth daily. 04/24/21   [provider]   testosterone cypionate (DEPOTESTOSTERONE CYPIONATE) 200 MG/ML injection Inject 200 mg into the muscle every 14 (fourteen) days. 01/07/17   [provider]      Allergies    Doxycycline    Review of Systems   Review of Systems  All other systems reviewed and are negative.   Physical Exam Updated Vital Signs BP (!) 143/89   Pulse 82   Temp 97.8 F (36.6 C) (Oral)   Resp (!) 24   SpO2 94%  Physical Exam Vitals and nursing note reviewed.  Constitutional:      General: He is not in acute distress.    Appearance: He is well-developed.  HENT:     Head: Normocephalic and atraumatic.  Eyes:     Conjunctiva/sclera: Conjunctivae normal.  Cardiovascular:     Rate and Rhythm: Regular rhythm. Bradycardia present.  Pulmonary:     Effort: Pulmonary effort is normal. No respiratory distress.     Breath sounds: No stridor.  Abdominal:     General: There is no distension.  Skin:    General: Skin is warm and dry.  Neurological:     Mental Status: He is alert and oriented to person, place, and time.     ED Results / Procedures / Treatments   Labs (all labs ordered are listed, but only abnormal results are displayed) Labs Reviewed  BASIC METABOLIC PANEL - Abnormal; Notable for the following components:      Result  Value   Glucose, Bld 115 (*)    Creatinine, Ser 1.25 (*)    All other components within normal limits  CBC - Abnormal; Notable for the following components:   Hemoglobin 17.1 (*)    All other components within normal limits  MAGNESIUM  TROPONIN I (HIGH SENSITIVITY)  TROPONIN I (HIGH SENSITIVITY)    EKG EKG Interpretation  Date/Time:  Wednesday March 19 2022 20:12:34 EDT Ventricular Rate:  62 PR Interval:    QRS Duration: 94 QT Interval:  376 QTC Calculation: 381 R Axis:   -29 Text Interpretation: Atrial fibrillation with premature ventricular or aberrantly conducted complexes Abnormal ECG Confirmed by Carmin Muskrat [phone]) on 03/19/2022 8:35:58  PM  Radiology DG Chest 2 View  Result Date: 03/19/2022 CLINICAL DATA:  Chest pain EXAM: CHEST - 2 VIEW COMPARISON:  Chest x-ray 06/03/2020 FINDINGS: The heart size and mediastinal contours are within normal limits. Both lungs are clear. The visualized skeletal structures are unremarkable. IMPRESSION: No active cardiopulmonary disease. Electronically Signed   By: Ronney Asters M.D.   On: 03/19/2022 21:33    Procedures Procedures    Medications Ordered in ED Medications  aspirin chewable tablet 324 mg (has no administration in time range)    ED Course/ Medical Decision Making/ A&P This patient with a Hx of A-fib, hypertension presents to the ED for concern of chest pain, this involves an extensive number of treatment options, and is a complaint that carries with it a high risk of complications and morbidity.    The differential diagnosis includes ACS, pericarditis, pneumonia, less likely PE, symptomatic bradycardia   Social Determinants of Health:  No limits  Additional history obtained:  Additional history and/or information obtained from chart review, notable for ongoing evaluation with EP, multiple prior interventions for A-fib including ablation, cardioversion, ongoing chronic Eliquis use   After the initial evaluation, orders, including: Labs x-ray monitoring were initiated.   Patient placed on Cardiac and Pulse-Oximetry Monitors. The patient was maintained on a cardiac monitor.  The cardiac monitored showed an rhythm of intermittently sinus bradycardia, 40s 50s abnormal and A-fib, 70s 80s also normal The patient was also maintained on pulse oximetry. The readings were typically 95% room air borderline   On repeat evaluation of the patient stayed the same  Lab Tests:  I personally interpreted labs.  The pertinent results include: Initial troponin is normal, patient has mild hyperglycemia, mild creatinine elevation  Imaging Studies ordered:  I independently visualized  and interpreted imaging which showed no pneumonia I agree with the radiologist interpretation  Consultations Obtained:  I requested consultation with the cardiology team,  and discussed lab and imaging findings as well as pertinent plan - they recommend: Admission, for serial troponins, consideration of echocardiogram in the morning, cardiology will follow as a consulting team as needed.  Dispostion / Final MDM:  After consideration of the diagnostic results and the patient's response to treatment, this adult male with history of cardiomyopathy, hypertension and A-fib presents with chest pain.  Patient is on Eliquis, but he acknowledges, and chart is consistent with occasional lapses in compliance.  Initial studies are generally reassuring with no obvious ongoing ischemia, giving on chest pain, elevated risk profile concern for his history of A-fib, cardiomyopathy, who is admitted for further monitoring, management.  Final Clinical Impression(s) / ED Diagnoses Final diagnoses:  Longstanding persistent atrial fibrillation ([place])  Atypical chest pain     Carmin Muskrat, MD 03/19/22 2234

## 2022-03-19 NOTE — H&P (Signed)
History and Physical    Patient: Michael Choi WUJ:[id number] DOB: 12/23/1960 DOA: 03/19/2022 DOS: the patient was seen and examined on 03/19/2022 PCP: Carolynn Serve, MD  Patient coming from: Home  Chief Complaint:  Chief Complaint  Patient presents with   Atrial Fibrillation   HPI: Michael Choi is a 61 y.o. male with medical history significant of a.fib, HTN, NICM.  Pt with h/o persistent A.Fib despite multiple cardio versions + ablation(s), takes flecainide and BB, takes eliquis though with some gaps in dosing.  Today, just prior to ED arrival the patient felt left-sided chest pain which he notes is different from prior episodes of rapid A-fib.  No dyspnea, symptoms are worse with motion.  No abdominal pain nausea, vomiting.  Last echo ~1 yr ago = EF mildly depressed (45-50%) but stable from prior.   Review of Systems: As mentioned in the history of present illness. All other systems reviewed and are negative. Past Medical History:  Diagnosis Date   Atrial flutter [facility])    s/p CTI ablation by Dr Graciela Husbands   Atypical nevus 04/16/2016   left outer back (mild)    Hypertension    Melanoma (HCC)    stage I,  resected.  did not require chemotherapy or radiation   Persistent atrial fibrillation [facility])    Past Surgical History:  Procedure Laterality Date   A-FLUTTER ABLATION N/A 03/05/2018   Procedure: A-FLUTTER ABLATION;  Surgeon: Duke Salvia, MD;  Location: [facility] INVASIVE CV LAB;  Service: Cardiovascular;  Laterality: N/A;   ATRIAL FIBRILLATION ABLATION N/A 08/23/2020   Procedure: ATRIAL FIBRILLATION ABLATION;  Surgeon: Hillis Range, MD;  Location: [facility] INVASIVE CV LAB;  Service: Cardiovascular;  Laterality: N/A;   CARDIOVERSION N/A 11/27/2017   Procedure: CARDIOVERSION;  Surgeon: Elder Negus, MD;  Location: [facility] ENDOSCOPY;  Service: Cardiovascular;  Laterality: N/A;   CARDIOVERSION N/A 03/18/2019   Procedure: CARDIOVERSION;  Surgeon: Lewayne Bunting, MD;  Location: [facility] ENDOSCOPY;   Service: Cardiovascular;  Laterality: N/A;   CARDIOVERSION N/A 07/18/2020   Procedure: CARDIOVERSION;  Surgeon: Jake Bathe, MD;  Location: [facility] ENDOSCOPY;  Service: Cardiovascular;  Laterality: N/A;   CARDIOVERSION N/A 01/18/2021   Procedure: CARDIOVERSION;  Surgeon: Sande Rives, MD;  Location: [facility] ENDOSCOPY;  Service: Cardiovascular;  Laterality: N/A;   KNEE ARTHROSCOPY     TEE WITHOUT CARDIOVERSION N/A 11/27/2017   Procedure: TRANSESOPHAGEAL ECHOCARDIOGRAM (TEE);  Surgeon: Elder Negus, MD;  Location: [facility] ENDOSCOPY;  Service: Cardiovascular;  Laterality: N/A;   TEE WITHOUT CARDIOVERSION N/A 03/18/2019   Procedure: TRANSESOPHAGEAL ECHOCARDIOGRAM (TEE);  Surgeon: Lewayne Bunting, MD;  Location: [facility] ENDOSCOPY;  Service: Cardiovascular;  Laterality: N/A;   Social History:  reports that he has never smoked. He has never used smokeless tobacco. He reports current alcohol use of about 1.0 standard drink of alcohol per week. He reports that he does not use drugs.  Allergies  Allergen Reactions   Doxycycline Hives, Swelling and Other (See Comments)    "I felt a little swelling in my throat," so he ceased taking it    Family History  Problem Relation Age of Onset   Cancer Mother    Heart disease Father        rheumatic heart disease s/p transplant   Hypertension Brother     Prior to Admission medications   Medication Sig Start Date End Date Taking? Authorizing Provider  amLODipine (NORVASC) 5 MG tablet Take 5 mg by mouth at bedtime. 12/31/16   [provider]  ELIQUIS 5 MG TABS tablet TAKE 1 TABLET BY MOUTH TWICE A DAY 03/10/22   Allred, Fayrene Fearing, MD  flecainide (TAMBOCOR) 50 MG tablet Take 1 tablet (50 mg total) by mouth 2 (two) times daily. 04/10/21   Allred, Fayrene Fearing, MD  losartan (COZAAR) 50 MG tablet Take 50 mg by mouth in the morning and at bedtime. 01/20/17   [provider]  MAGNESIUM PO Take 1 tablet by mouth every other day.    [provider]   metoprolol succinate (TOPROL-XL) 50 MG 24 hr tablet Take 1 tablet (50 mg total) by mouth at bedtime. Take with or immediately following a meal. 06/07/21   Allred, Fayrene Fearing, MD  multivitamin [facility]) per tablet Take 1 tablet by mouth every other day.    [provider]  tadalafil (CIALIS) 10 MG tablet Take 10 mg by mouth daily. 04/24/21   [provider]  testosterone cypionate (DEPOTESTOSTERONE CYPIONATE) 200 MG/ML injection Inject 200 mg into the muscle every 14 (fourteen) days. 01/07/17   [provider]    Physical Exam: Vitals:   03/19/22 2008 03/19/22 2200  BP: (!) 145/111 (!) 143/89  Pulse: (!) 112 82  Resp: 17 (!) 24  Temp: 97.8 F (36.6 C)   TempSrc: Oral   SpO2: 95% 94%   Constitutional: NAD, calm, comfortable Eyes: PERRL, lids and conjunctivae normal ENMT: Mucous membranes are moist. Posterior pharynx clear of any exudate or lesions.Normal dentition.  Neck: normal, supple, no masses, no thyromegaly Respiratory: clear to auscultation bilaterally, no wheezing, no crackles. Normal respiratory effort. No accessory muscle use.  Cardiovascular: IRR, IRR, no murmurs / rubs / gallops. No extremity edema. 2+ pedal pulses. No carotid bruits.  Abdomen: no tenderness, no masses palpated. No hepatosplenomegaly. Bowel sounds positive.  Musculoskeletal: no clubbing / cyanosis. No joint deformity upper and lower extremities. Good ROM, no contractures. Normal muscle tone.  Skin: no rashes, lesions, ulcers. No induration Neurologic: CN 2-12 grossly intact. Sensation intact, DTR normal. Strength 5/5 in all 4.  Psychiatric: Normal judgment and insight. Alert and oriented x 3. Normal mood.   Data Reviewed:    Trop 14  EKG = In and out of A.fib  Assessment and Plan: * Chest pain, rule out acute myocardial infarction HEART score = 3 Not really impressive for ischemic CP, more wondering if him going in and out of A.Fib is related? CP obs pathway EDP spoke with  cards who will see in AM Serial trops - 1st trop neg Tele monitor NPO after MN  Persistent atrial fibrillation (HCC) Pt in and out of A.Fib / flutter today, bouncing back and forth between a.fib and Sinus rhythm with PVCs and PACs. Cont Flecainide Cont Eliquis Cont metoprolol  HTN (hypertension) Cont home meds when med rec completed.      Advance Care Planning:   Code Status: Full Code  Consults: EDP spoke with cards  Family Communication: No family in room  Severity of Illness: The appropriate patient status for this patient is OBSERVATION. Observation status is judged to be reasonable and necessary in order to provide the required intensity of service to ensure the patient's safety. The patient's presenting symptoms, physical exam findings, and initial radiographic and laboratory data in the context of their medical condition is felt to place them at decreased risk for further clinical deterioration. Furthermore, it is anticipated that the patient will be medically stable for discharge from the hospital within 2 midnights of admission.   Author: Hillary Bow., DO 03/19/2022  10:58 PM  For on call review [email or web address].

## 2022-03-19 NOTE — Assessment & Plan Note (Signed)
Cont home meds when med rec completed. 

## 2022-03-19 NOTE — ED Triage Notes (Signed)
Pt c/o left sided chest pain and left arm aching, started approx. 1 hour pta. Hx afib, flutter. Denies missing medication doses.

## 2022-03-19 NOTE — Assessment & Plan Note (Signed)
Pt in and out of A.Fib / flutter today, bouncing back and forth between a.fib and Sinus rhythm with PVCs and PACs. 1. Cont Flecainide 2. Cont Eliquis 3. Cont metoprolol

## 2022-03-19 NOTE — ED Provider Triage Note (Signed)
Emergency Medicine Provider Triage Evaluation Note  Michael Choi , a 61 y.o. male  was evaluated in triage.  Pt complains of chest pain and shortness of breath starting one hour ago.  Hx of atrial flutter, atrial fibrillation, and multiple cardioversions.  On chronic eliquis '5mg'$  BID.  Took his second dose of eliquis immediately prior to ED arrival.  Denies active shortness of breath, however states this chest pain feels different than previous episodes.  Denies N/V/D, neck stiffness, dizziness, or lightheadedness.  However left arm was initially sore, and now feels numb.  Review of Systems  Positive:  Negative: See above  Physical Exam  There were no vitals taken for this visit. Gen:   Awake, no distress   Resp:  Normal effort, CTAB, equal rise MSK:   Moves extremities without difficulty  Other:  Gait appears intact.  Chest non-TTP.  HR appears irregularly irregular.  Left upper extremity appears neurovascularly intact, without pallor, cyanosis, decreased sensation, or diminished pulses  Medical Decision Making  Medically screening exam initiated at 8:06 PM.  Appropriate orders placed.  Michael Choi was informed that the remainder of the evaluation will be completed by another provider, this initial triage assessment does not replace that evaluation, and the importance of remaining in the ED until their evaluation is complete.     Prince Rome, PA-C 75/05/18 2013

## 2022-03-20 ENCOUNTER — Observation Stay (HOSPITAL_BASED_OUTPATIENT_CLINIC_OR_DEPARTMENT_OTHER): Payer: 59

## 2022-03-20 ENCOUNTER — Observation Stay (HOSPITAL_BASED_OUTPATIENT_CLINIC_OR_DEPARTMENT_OTHER)
Admit: 2022-03-20 | Discharge: 2022-03-20 | Disposition: A | Discharge status: Home / Self Care | Payer: 59 | Attending: Cardiology | Admitting: Cardiology

## 2022-03-20 DIAGNOSIS — I10 Essential (primary) hypertension: Secondary | ICD-10-CM | POA: Diagnosis not present

## 2022-03-20 DIAGNOSIS — I480 Paroxysmal atrial fibrillation: Secondary | ICD-10-CM | POA: Diagnosis not present

## 2022-03-20 DIAGNOSIS — R0789 Other chest pain: Secondary | ICD-10-CM

## 2022-03-20 DIAGNOSIS — R079 Chest pain, unspecified: Secondary | ICD-10-CM

## 2022-03-20 DIAGNOSIS — R55 Syncope and collapse: Secondary | ICD-10-CM | POA: Diagnosis not present

## 2022-03-20 DIAGNOSIS — I4819 Other persistent atrial fibrillation: Secondary | ICD-10-CM | POA: Diagnosis not present

## 2022-03-20 DIAGNOSIS — I509 Heart failure, unspecified: Secondary | ICD-10-CM | POA: Diagnosis not present

## 2022-03-20 DIAGNOSIS — I4811 Longstanding persistent atrial fibrillation: Secondary | ICD-10-CM

## 2022-03-20 DIAGNOSIS — Z79899 Other long term (current) drug therapy: Secondary | ICD-10-CM | POA: Diagnosis not present

## 2022-03-20 DIAGNOSIS — Z7901 Long term (current) use of anticoagulants: Secondary | ICD-10-CM | POA: Diagnosis not present

## 2022-03-20 LAB — ECHOCARDIOGRAM COMPLETE
Calc EF: 52.8 %
Height: 71 in
S' Lateral: 4.7 cm
Single Plane A2C EF: 52 %
Single Plane A4C EF: 54 %
Weight: 3875.2 oz

## 2022-03-20 LAB — CBC WITH DIFFERENTIAL/PLATELET
Abs Immature Granulocytes: 0.02 10*3/uL (ref 0.00–0.07)
Basophils Absolute: 0 10*3/uL (ref 0.0–0.1)
Basophils Relative: 0 %
Eosinophils Absolute: 0.2 10*3/uL (ref 0.0–0.5)
Eosinophils Relative: 2 %
HCT: 52.5 % — ABNORMAL HIGH (ref 39.0–52.0)
Hemoglobin: 18 g/dL — ABNORMAL HIGH (ref 13.0–17.0)
Immature Granulocytes: 0 %
Lymphocytes Relative: 23 %
Lymphs Abs: 1.8 10*3/uL (ref 0.7–4.0)
MCH: 31.9 pg (ref 26.0–34.0)
MCHC: 34.3 g/dL (ref 30.0–36.0)
MCV: 93.1 fL (ref 80.0–100.0)
Monocytes Absolute: 0.9 10*3/uL (ref 0.1–1.0)
Monocytes Relative: 12 %
Neutro Abs: 4.6 10*3/uL (ref 1.7–7.7)
Neutrophils Relative %: 63 %
Platelets: 220 10*3/uL (ref 150–400)
RBC: 5.64 MIL/uL (ref 4.22–5.81)
RDW: 13.1 % (ref 11.5–15.5)
WBC: 7.5 10*3/uL (ref 4.0–10.5)
nRBC: 0 % (ref 0.0–0.2)

## 2022-03-20 LAB — COMPREHENSIVE METABOLIC PANEL
ALT: 34 U/L (ref 0–44)
AST: 26 U/L (ref 15–41)
Albumin: 3.6 g/dL (ref 3.5–5.0)
Alkaline Phosphatase: 50 U/L (ref 38–126)
Anion gap: 7 (ref 5–15)
BUN: 12 mg/dL (ref 8–23)
CO2: 27 mmol/L (ref 22–32)
Calcium: 9 mg/dL (ref 8.9–10.3)
Chloride: 105 mmol/L (ref 98–111)
Creatinine, Ser: 1.22 mg/dL (ref 0.61–1.24)
GFR, Estimated: >60 mL/min (ref >60)
Glucose, Bld: 82 mg/dL (ref 70–99)
Potassium: 4 mmol/L (ref 3.5–5.1)
Sodium: 139 mmol/L (ref 135–145)
Total Bilirubin: 0.9 mg/dL (ref 0.3–1.2)
Total Protein: 7.1 g/dL (ref 6.5–8.1)

## 2022-03-20 LAB — LIPID PANEL
Cholesterol: 195 mg/dL (ref 0–200)
HDL: 52 mg/dL (ref >40)
LDL Cholesterol: 127 mg/dL — ABNORMAL HIGH (ref 0–99)
Total CHOL/HDL Ratio: 3.8 RATIO
Triglycerides: 80 mg/dL (ref <150)
VLDL: 16 mg/dL (ref 0–40)

## 2022-03-20 LAB — PHOSPHORUS: Phosphorus: 2.1 mg/dL — ABNORMAL LOW (ref 2.5–4.6)

## 2022-03-20 LAB — MAGNESIUM
Magnesium: 1.9 mg/dL (ref 1.7–2.4)
Magnesium: 1.9 mg/dL (ref 1.7–2.4)

## 2022-03-20 LAB — D-DIMER, QUANTITATIVE: D-Dimer, Quant: 0.39 ug/mL-FEU (ref 0.00–0.50)

## 2022-03-20 LAB — TROPONIN I (HIGH SENSITIVITY): Troponin I (High Sensitivity): 16 ng/L (ref <18)

## 2022-03-20 MED ORDER — K PHOS MONO-SOD PHOS DI & MONO 155-852-130 MG PO TABS: 500.0000 mg | ORAL_TABLET | Freq: Two times a day (BID) | ORAL | 0 refills | Status: AC

## 2022-03-20 MED ORDER — LOSARTAN POTASSIUM 50 MG PO TABS
50.0000 mg | ORAL_TABLET | Freq: Two times a day (BID) | ORAL | Status: DC
  Administered 2022-03-20: 50 mg via ORAL
  Filled 2022-03-20: qty 1

## 2022-03-20 MED ORDER — K PHOS MONO-SOD PHOS DI & MONO 155-852-130 MG PO TABS
500.0000 mg | ORAL_TABLET | Freq: Four times a day (QID) | ORAL | Status: DC
  Filled 2022-03-20 (×2): qty 2

## 2022-03-20 MED ORDER — METOPROLOL SUCCINATE ER 50 MG PO TB24: 50.0000 mg | ORAL_TABLET | Freq: Every day | ORAL | 0 refills | Status: DC

## 2022-03-20 NOTE — Consult Note (Signed)
Cardiology Consultation   Patient ID: Michael Choi MRN: [id number]; DOB: [birth year]  Admit date: 03/19/2022 Date of Consult: 03/20/2022  PCP:  Michael Serve, MD   [facility] Providers Cardiologist:  None  Electrophysiologist:  Michael Range, MD     Patient Profile:   Michael Choi is a 61 y.o. male with a hx of atrial flutter s/p ablation in 02/2018, persistent atrial fibrillation s/p ablation in 08/2020, HTN, OSA, NICM who is being seen 03/20/2022 for the evaluation of chest pain at the request of Dr. Margo Choi.  History of Present Illness:   Michael Choi is a 61 year old male with above medical history who is followed by Dr. Johney Choi.  Per chart review, patient established care with cardiology in 11/2017.  Patient was admitted to the hospital for evaluation of palpitations, found to be in atrial flutter.  He underwent a successful TEE guided cardioversion on 11/27/2017.  Of note, TEE showed EF 30-35%, echo on 5/19 showed EF 45-50%.  Reduced EF was believed to be secondary to atrial flutter with RVR.  After that admission, patient wore a 30-day heart monitor in 12/2017 that showed atrial flutter with variable conduction.  He had continued to be symptomatic with palpitations, exercise intolerance.  He underwent an atrial flutter ablation on 03/05/2018 with Dr. Graciela Choi.  After his ablation, patient continued to have some exertional dyspnea.  He also developed some nonspecific chest pain.  For further evaluation, he underwent a coronary CT on 06/07/2018 that showed a coronary calcium score of 0, minimal luminal irregularities with no CAD.  Findings were consistent with a nonischemic cardiomyopathy.  He also underwent echocardiogram on 06/16/2018 that showed EF 40-45%, grade 2 diastolic dysfunction.   After his atrial flutter ablation on 03/05/2018, patient maintained normal sinus rhythm for about a year.  However on 03/05/2019, patient had issues with palpitations and shortness of breath that lasted about an  hour.  He went to the emergency department, was found to be in rate controlled atrial fibrillation.  He did not undergo cardioversion as he had not been taking anticoagulation secondary to cost.  He underwent a TEE on 03/18/2019 that showed EF 45-50%, no evidence of thrombus.  He underwent cardioversion at that time.  Unfortunately, the patient had early return to afib. He was started on metoprolol, which helped his symptoms significantly.   He was followed by the afib clinic as an outpatient. His afib symptoms were overall well controlled on metoprolol until 07/11/2020 when patient complained of dizziness, fatigue, and exercise intolerance. He had been taking AC consistently, so he underwent cardioversion on 07/18/2020. He was seen in office for a follow up appointment on 08/10/2020 and was back in afib at that time. He underwent an atrial fibrillation ablation on 08/23/2020.   Patient maintained sinus rhythm for a few weeks post ablation, underwent a fourth cardioversion on 01/18/2021. He again quickly converted to afib shortly after cardioversion. He was started on flecainide at that time. Follow up echocardiogram on 04/26/2021 showed EF 45-50%, grade II diastolic dysfunction, normal RV systolic function. He has not been seen by cardiology since.   Patient presented to the ED on 8/30 complaining of left sided chest pain and left arm aching. Workup in the ED showed Na 140, K 3.9, creatinine 1.25, WBC 6.9, hemoglobin 17.1, platelets 226. hsTn 14>>16. EKG showed sinus rhythm with PACs and PVCs, HR 55 BPM. CXR showed no active cardiopulmonary disease.   On interview, patient reports that he was driving his car  yesterday when he developed left-sided chest pain.  Pain started mild and felt like "a needle prick".  Progressed to a stabbing pain. He decided to come to the ER, but upon arrival the pain had stopped. As he was going to leave the ER, he had another episode of stabbing pain and decided to stay. Since then,  pain has been intermittent. Present at rest. Worse with deep inhalation. The pain is not reproduced with palpation, but it does feel sore on palpation. He also feels sore in his left armpit.   Patient has a long history of paroxysmal atrial fibrillation. When he has episodes of afib, he often feels SOB and palpitations. He denies having these symptoms. Also denies having any dizziness, syncope, or near syncope. However, patient did have 2 syncopal episodes about a month ago. Both times he had gone from sitting on the cough to standing, then lost consciousness. He did not go to the ER after either episode. He also reports having slow HR's at home on pulse ox, down to the 30s   Past Medical History:  Diagnosis Date   Atrial flutter [facility])    s/p CTI ablation by Dr Michael Choi   Atypical nevus 04/16/2016   left outer back (mild)    Hypertension    Melanoma (HCC)    stage I,  resected.  did not require chemotherapy or radiation   Persistent atrial fibrillation [facility])     Past Surgical History:  Procedure Laterality Date   A-FLUTTER ABLATION N/A 03/05/2018   Procedure: A-FLUTTER ABLATION;  Surgeon: Michael Salvia, MD;  Location: [facility] INVASIVE CV LAB;  Service: Cardiovascular;  Laterality: N/A;   ATRIAL FIBRILLATION ABLATION N/A 08/23/2020   Procedure: ATRIAL FIBRILLATION ABLATION;  Surgeon: Michael Range, MD;  Location: [facility] INVASIVE CV LAB;  Service: Cardiovascular;  Laterality: N/A;   CARDIOVERSION N/A 11/27/2017   Procedure: CARDIOVERSION;  Surgeon: Michael Negus, MD;  Location: [facility] ENDOSCOPY;  Service: Cardiovascular;  Laterality: N/A;   CARDIOVERSION N/A 03/18/2019   Procedure: CARDIOVERSION;  Surgeon: Michael Bunting, MD;  Location: [facility] ENDOSCOPY;  Service: Cardiovascular;  Laterality: N/A;   CARDIOVERSION N/A 07/18/2020   Procedure: CARDIOVERSION;  Surgeon: Michael Bathe, MD;  Location: [facility] ENDOSCOPY;  Service: Cardiovascular;  Laterality: N/A;   CARDIOVERSION N/A 01/18/2021   Procedure:  CARDIOVERSION;  Surgeon: Sande Rives, MD;  Location: [facility] ENDOSCOPY;  Service: Cardiovascular;  Laterality: N/A;   KNEE ARTHROSCOPY     TEE WITHOUT CARDIOVERSION N/A 11/27/2017   Procedure: TRANSESOPHAGEAL ECHOCARDIOGRAM (TEE);  Surgeon: Michael Negus, MD;  Location: [facility] ENDOSCOPY;  Service: Cardiovascular;  Laterality: N/A;   TEE WITHOUT CARDIOVERSION N/A 03/18/2019   Procedure: TRANSESOPHAGEAL ECHOCARDIOGRAM (TEE);  Surgeon: Michael Bunting, MD;  Location: [facility] ENDOSCOPY;  Service: Cardiovascular;  Laterality: N/A;     Home Medications:  Prior to Admission medications   Medication Sig Start Date End Date Taking? Authorizing Provider  amLODipine (NORVASC) 5 MG tablet Take 5 mg by mouth at bedtime. 12/31/16  Yes [provider]  ELIQUIS 5 MG TABS tablet TAKE 1 TABLET BY MOUTH TWICE A DAY 03/10/22  Yes Allred, Fayrene Fearing, MD  flecainide (TAMBOCOR) 50 MG tablet Take 1 tablet (50 mg total) by mouth 2 (two) times daily. 04/10/21  Yes Allred, Fayrene Fearing, MD  losartan (COZAAR) 50 MG tablet Take 50 mg by mouth in the morning and at bedtime. 01/20/17  Yes [provider]  metoprolol tartrate (LOPRESSOR) 25 MG tablet Take 25 mg by mouth 2 (two) times  daily. 02/19/22  Yes [provider]  testosterone cypionate (DEPOTESTOSTERONE CYPIONATE) 200 MG/ML injection Inject 150 mg into the muscle once a week. 01/07/22  Yes [provider]  metoprolol succinate (TOPROL-XL) 50 MG 24 hr tablet Take 1 tablet (50 mg total) by mouth at bedtime. Take with or immediately following a meal. Patient not taking: Reported on 03/19/2022 06/07/21   Michael Range, MD    Inpatient Medications: Scheduled Meds:  apixaban  5 mg Oral BID   flecainide  50 mg Oral BID   metoprolol succinate  50 mg Oral QHS   Continuous Infusions:  PRN Meds: acetaminophen, ondansetron (ZOFRAN) IV  Allergies:    Allergies  Allergen Reactions   Doxycycline Hives, Swelling and Other (See Comments)    "I felt a  little swelling in my throat," so he ceased taking it    Social History:   Social History   Socioeconomic History   Marital status: Single    Spouse name: Not on file   Number of children: Not on file   Years of education: Not on file   Highest education level: Not on file  Occupational History   Not on file  Tobacco Use   Smoking status: Never   Smokeless tobacco: Never  Vaping Use   Vaping Use: Never used  Substance and Sexual Activity   Alcohol use: Yes    Alcohol/week: 1.0 standard drink of alcohol    Types: 1 Standard drinks or equivalent per week    Comment: glass of wine twice per month   Drug use: No   Sexual activity: Not on file  Other Topics Concern   Not on file  Social History Narrative   Lives in [place] with fiance   Works with department of [employer]   Social Determinants of Health   Financial Resource Strain: Not on file  Food Insecurity: Not on file  Transportation Needs: Not on file  Physical Activity: Not on file  Stress: Not on file  Social Connections: Not on file  Intimate Partner Violence: Not on file    Family History:    Family History  Problem Relation Age of Onset   Cancer Mother    Heart disease Father        rheumatic heart disease s/p transplant   Hypertension Brother      ROS:  Please see the history of present illness.   All other ROS reviewed and negative.     Physical Exam/Data:   Vitals:   03/20/22 0622 03/20/22 0812 03/20/22 0820 03/20/22 1038  BP: 138/88 129/82 123/64 (!) 140/88  Pulse: 66 (!) 55 81 83  Resp: 20 18 17 18   Temp: 98 F (36.7 C) 97.6 F (36.4 C) 98.1 F (36.7 C) 97.8 F (36.6 C)  TempSrc: Oral Oral Oral Oral  SpO2: 96% 97% 91% 96%  Weight:      Height:        Intake/Output Summary (Last 24 hours) at 03/20/2022 1357 Last data filed at 03/20/2022 0015 Gross per 24 hour  Intake 240 ml  Output --  Net 240 ml      03/20/2022   12:01 AM 06/07/2021   12:10 PM 05/03/2021   11:56 AM   Last 3 Weights  Weight (lbs) 242 lb 3.2 oz 247 lb 12.8 oz 245 lb 3.2 oz  Weight (kg) 109.861 kg 112.401 kg 111.222 kg     Body mass index is 33.78 kg/m.  General:  Well nourished, well developed, in no  acute distress. Sitting comfortably in the bed  HEENT: normal Neck: no JVD Vascular: Radial pulses 2+ bilaterally Cardiac:  normal S1, S2; RRR; no murmur. Chest wall is sore on palpation   Lungs:  clear to auscultation bilaterally, no wheezing, rhonchi or rales  Abd: soft, nontender, no hepatomegaly  Ext: no edema Musculoskeletal:  No deformities, BUE and BLE strength normal and equal Skin: warm and dry  Neuro:  CNs 2-12 intact, no focal abnormalities noted Psych:  Normal affect   EKG:  The EKG was personally reviewed and demonstrates:   Telemetry:  Telemetry was personally reviewed and demonstrates:  Sinus rhythm with frequent PACs and PVCs   Relevant CV Studies:  Echocardiogram pending   Laboratory Data:  High Sensitivity Troponin:   Recent Labs  Lab 03/19/22 2016 03/19/22 2256  TROPONINIHS 14 16     Chemistry Recent Labs  Lab 03/19/22 2016 03/19/22 2256  NA 140  --   K 3.9  --   CL 104  --   CO2 24  --   GLUCOSE 115*  --   BUN 14  --   CREATININE 1.25*  --   CALCIUM 9.4  --   MG  --  1.9  GFRNONAA >60  --   ANIONGAP 12  --     No results for input(s): "PROT", "ALBUMIN", "AST", "ALT", "ALKPHOS", "BILITOT" in the last 168 hours. Lipids No results for input(s): "CHOL", "TRIG", "HDL", "LABVLDL", "LDLCALC", "CHOLHDL" in the last 168 hours.  Hematology Recent Labs  Lab 03/19/22 2016  WBC 6.9  RBC 5.34  HGB 17.1*  HCT 49.4  MCV 92.5  MCH 32.0  MCHC 34.6  RDW 13.2  PLT 226   Thyroid No results for input(s): "TSH", "FREET4" in the last 168 hours.  BNPNo results for input(s): "BNP", "PROBNP" in the last 168 hours.  DDimer No results for input(s): "DDIMER" in the last 168 hours.   Radiology/Studies:  DG Chest 2 View  Result Date: 03/19/2022 CLINICAL  DATA:  Chest pain EXAM: CHEST - 2 VIEW COMPARISON:  Chest x-ray 06/03/2020 FINDINGS: The heart size and mediastinal contours are within normal limits. Both lungs are clear. The visualized skeletal structures are unremarkable. IMPRESSION: No active cardiopulmonary disease. Electronically Signed   By: Darliss Cheney M.D.   On: 03/19/2022 21:33     Assessment and Plan:   Chest Pain  - Patient presented complaining of left sided chest pain. Feels like he is being stabbed. Located under his left pectoral muscle. Associated with left armpit soreness, sore to palpation. Worse with deep inhalation. Occurs at rest  - Patient exercises multiple days a week, denies any recent chest pain or SOB on exertion  - Had a coronary CT in 05/2018 that showed coronary calcium score of 0, no CAD.  - Had a second cardiac CT prior to ablation in 07/2020 that also showed a coronary calcium score of 0  - Echo pending  - Chest pain is very atypical, hsTn negative x2. Pending echo results, I suspect patient can be discharged with outpatient follow up  Persistent Atrial Fibrillation  - Patient has a long history of atrial fibrillation. Had an atrial flutter ablation in 02/2018, an atrial fibrillation ablation in 08/2020, and multiple cardioversions. - Continue flecainide 50 mg BID  - Patient was taking metoprolol 25 mg BID prior to admission, reports bradycardia at home. Telemetry shows sinus rhythm with frequent PACs and PVCs, HR 50s-70s. Suspect PACs caused pulse ox to show bradycardia. Continue metoprolol  25 mg BID at discharge  - Consider wearing monitor at home   Syncope Bradycardia  - Patient reported having 2 syncopal episodes about 4 weeks ago. Both events occurred after going from sitting to standing. He did not seek medical attention afterwards. Suspicious for orthostatic syncope  - Denies having any syncopal/near syncopal episodes since  - Consider outpatient monitor, echo pending   Risk Assessment/Risk Scores:      HEAR Score (for undifferentiated chest pain):  Pathway disabled     CHA2DS2-VASc Score = 2  This indicates a 2.2% annual risk of stroke. The patient's score is based upon: CHF History: 1 HTN History: 1 Diabetes History: 0 Stroke History: 0 Vascular Disease History: 0 Age Score: 0 Gender Score: 0  For questions or updates, please contact [facility] Please consult [email or web address] for contact info under    Signed, Jonita Albee, PA-C  03/20/2022 1:57 PM

## 2022-03-20 NOTE — Plan of Care (Signed)

## 2022-03-20 NOTE — Discharge Summary (Signed)
Discharge Summary  Michael Choi:[id number] DOB: 05/18/1961  PCP: [name], MD  Admit date: 03/19/2022 Discharge date: 03/20/2022  Time spent: 35 minutes  Recommendations for Outpatient Follow-up:  Follow-up with cardiology  Discharge Diagnoses:  Active Hospital Problems   Diagnosis Date Noted   Chest pain, rule out acute myocardial infarction 03/19/2022    Priority: High   Persistent atrial fibrillation ([place]) 10/27/2019    Priority: Low   HTN (hypertension) 10/13/2011    Priority: Low   Atypical chest pain    Syncope and collapse    Paroxysmal atrial fibrillation [facility])     Resolved Hospital Problems  No resolved problems to display.    Discharge Condition: Stable  Diet recommendation: Heart healthy diet  Vitals:   03/20/22 0820 03/20/22 1038  BP: 123/64 (!) 140/88  Pulse: 81 83  Resp: 17 18  Temp: 98.1 F (36.7 C) 97.8 F (36.6 C)  SpO2: 91% 96%    History of present illness:   Michael Choi is a 61 y.o. male with medical history significant of a.fib, HTN, NICM.   Pt with h/o persistent A.Fib despite multiple cardio versions + ablation(s), takes flecainide and BB, takes eliquis though with some gaps in dosing.   Today, just prior to ED arrival the patient felt left-sided chest pain which he notes is different from prior episodes of rapid A-fib.  No dyspnea, symptoms are worse with motion.  No abdominal pain nausea, vomiting. Last echo ~1 yr ago = EF mildly depressed (45-50%) but stable from prior.    03/20/2022: Seen and examined at his bedside.  Left chest wall pain on exam, reproducible on palpation.  Seen by cardiology, okay to go home.  Will follow-up outpatient.  Due to history of recent syncope, will discharge with ambulatory telemetry.  Hospital Course:  Principal Problem:   Chest pain, rule out acute myocardial infarction Active Problems:   HTN (hypertension)   Persistent atrial fibrillation ([facility])   Atypical chest pain   Syncope and collapse    Paroxysmal atrial fibrillation ([facility])  Chest pain, rule out acute myocardial infarction Seen by cardiology Chest pain is thought to be musculoskeletal High-sensitivity troponin negative x2 Echocardiogram LVEF 45 to 50%.   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 year-old male well developed well nourished in no acute distress.  Alert and oriented x3. Cardiovascular: Regular rate and rhythm with no rubs or gallops.  No thyromegaly or JVD noted.   Respiratory: Clear to auscultation with no wheezes or rales. Good inspiratory effort. Abdomen: Soft nontender nondistended with normal bowel sounds x4 quadrants. Musculoskeletal: No lower extremity edema. 2/4 pulses in all 4 extremities. Skin: No ulcerative lesions noted or rashes, Psychiatry: Mood is appropriate for condition and setting  Discharge Instructions You were cared for by a hospitalist during your hospital stay. If you have any questions about your discharge medications or the care you received  while you were in the hospital after you are discharged, you can call the unit and asked to speak with the hospitalist on call if the hospitalist that took care of you is not available. Once you are discharged, your primary care physician will handle any further medical issues. Please note that NO REFILLS for any discharge medications will be  authorized once you are discharged, as it is imperative that you return to your primary care physician (or establish a relationship with a primary care physician if you do not have one) for your aftercare needs so that they can reassess your need for medications and monitor your lab values.   Allergies as of 03/20/2022       Reactions   Doxycycline Hives, Swelling, Other (See Comments)   "I felt a little swelling in my throat," so he ceased taking it        Medication List     STOP taking these medications    amLODipine 5 MG tablet Commonly known as: NORVASC   metoprolol tartrate 25 MG tablet Commonly known as: LOPRESSOR       TAKE these medications    Eliquis 5 MG Tabs tablet Generic drug: apixaban TAKE 1 TABLET BY MOUTH TWICE A DAY   flecainide 50 MG tablet Commonly known as: TAMBOCOR Take 1 tablet (50 mg total) by mouth 2 (two) times daily.   losartan 50 MG tablet Commonly known as: COZAAR Take 50 mg by mouth in the morning and at bedtime.   metoprolol succinate 50 MG 24 hr tablet Commonly known as: TOPROL-XL Take 1 tablet (50 mg total) by mouth at bedtime. Take with or immediately following a meal.   phosphorus 155-852-130 MG tablet Commonly known as: K PHOS NEUTRAL Take 2 tablets (500 mg total) by mouth 2 (two) times daily for 3 days.   testosterone cypionate 200 MG/ML injection Commonly known as: DEPOTESTOSTERONE CYPIONATE Inject 150 mg into the muscle once a week.       Allergies  Allergen Reactions   Doxycycline Hives, Swelling and Other (See Comments)    "I felt a little swelling in my throat," so he ceased taking it      The results of significant diagnostics from this hospitalization (including imaging, microbiology, ancillary and laboratory) are listed below for reference.    Significant Diagnostic Studies: ECHOCARDIOGRAM COMPLETE  Result Date: 03/20/2022    ECHOCARDIOGRAM REPORT   Patient Name:   Michael Choi [facility] Date of Exam: 03/20/2022  Medical Rec #:  [id number]   Height:       71.0 in Accession #:    [id number]  Weight:       242.2 lb Date of Birth:  10-05-1960   BSA:          2.287 m Patient Age:    23 years    BP:           145/96 mmHg Patient Gender: M           HR:           47 bpm. Exam Location:  Inpatient Procedure: 2D Echo, Cardiac Doppler and Color Doppler Indications:    Congestive Heart Failure I50.9  History:        Patient has prior history of Echocardiogram examinations, most                 recent 04/26/2021. Arrythmias:Atrial Fibrillation and Atrial  Discharge Summary  Michael Choi:[id number] DOB: 05/18/1961  PCP: [name], MD  Admit date: 03/19/2022 Discharge date: 03/20/2022  Time spent: 35 minutes  Recommendations for Outpatient Follow-up:  Follow-up with cardiology  Discharge Diagnoses:  Active Hospital Problems   Diagnosis Date Noted   Chest pain, rule out acute myocardial infarction 03/19/2022    Priority: High   Persistent atrial fibrillation ([place]) 10/27/2019    Priority: Low   HTN (hypertension) 10/13/2011    Priority: Low   Atypical chest pain    Syncope and collapse    Paroxysmal atrial fibrillation [facility])     Resolved Hospital Problems  No resolved problems to display.    Discharge Condition: Stable  Diet recommendation: Heart healthy diet  Vitals:   03/20/22 0820 03/20/22 1038  BP: 123/64 (!) 140/88  Pulse: 81 83  Resp: 17 18  Temp: 98.1 F (36.7 C) 97.8 F (36.6 C)  SpO2: 91% 96%    History of present illness:   Michael Choi is a 61 y.o. male with medical history significant of a.fib, HTN, NICM.   Pt with h/o persistent A.Fib despite multiple cardio versions + ablation(s), takes flecainide and BB, takes eliquis though with some gaps in dosing.   Today, just prior to ED arrival the patient felt left-sided chest pain which he notes is different from prior episodes of rapid A-fib.  No dyspnea, symptoms are worse with motion.  No abdominal pain nausea, vomiting. Last echo ~1 yr ago = EF mildly depressed (45-50%) but stable from prior.    03/20/2022: Seen and examined at his bedside.  Left chest wall pain on exam, reproducible on palpation.  Seen by cardiology, okay to go home.  Will follow-up outpatient.  Due to history of recent syncope, will discharge with ambulatory telemetry.  Hospital Course:  Principal Problem:   Chest pain, rule out acute myocardial infarction Active Problems:   HTN (hypertension)   Persistent atrial fibrillation ([facility])   Atypical chest pain   Syncope and collapse    Paroxysmal atrial fibrillation ([facility])  Chest pain, rule out acute myocardial infarction Seen by cardiology Chest pain is thought to be musculoskeletal High-sensitivity troponin negative x2 Echocardiogram LVEF 45 to 50%.   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 year-old male well developed well nourished in no acute distress.  Alert and oriented x3. Cardiovascular: Regular rate and rhythm with no rubs or gallops.  No thyromegaly or JVD noted.   Respiratory: Clear to auscultation with no wheezes or rales. Good inspiratory effort. Abdomen: Soft nontender nondistended with normal bowel sounds x4 quadrants. Musculoskeletal: No lower extremity edema. 2/4 pulses in all 4 extremities. Skin: No ulcerative lesions noted or rashes, Psychiatry: Mood is appropriate for condition and setting  Discharge Instructions You were cared for by a hospitalist during your hospital stay. If you have any questions about your discharge medications or the care you received  while you were in the hospital after you are discharged, you can call the unit and asked to speak with the hospitalist on call if the hospitalist that took care of you is not available. Once you are discharged, your primary care physician will handle any further medical issues. Please note that NO REFILLS for any discharge medications will be  authorized once you are discharged, as it is imperative that you return to your primary care physician (or establish a relationship with a primary care physician if you do not have one) for your aftercare needs so that they can reassess your need for medications and monitor your lab values.   Allergies as of 03/20/2022       Reactions   Doxycycline Hives, Swelling, Other (See Comments)   "I felt a little swelling in my throat," so he ceased taking it        Medication List     STOP taking these medications    amLODipine 5 MG tablet Commonly known as: NORVASC   metoprolol tartrate 25 MG tablet Commonly known as: LOPRESSOR       TAKE these medications    Eliquis 5 MG Tabs tablet Generic drug: apixaban TAKE 1 TABLET BY MOUTH TWICE A DAY   flecainide 50 MG tablet Commonly known as: TAMBOCOR Take 1 tablet (50 mg total) by mouth 2 (two) times daily.   losartan 50 MG tablet Commonly known as: COZAAR Take 50 mg by mouth in the morning and at bedtime.   metoprolol succinate 50 MG 24 hr tablet Commonly known as: TOPROL-XL Take 1 tablet (50 mg total) by mouth at bedtime. Take with or immediately following a meal.   phosphorus 155-852-130 MG tablet Commonly known as: K PHOS NEUTRAL Take 2 tablets (500 mg total) by mouth 2 (two) times daily for 3 days.   testosterone cypionate 200 MG/ML injection Commonly known as: DEPOTESTOSTERONE CYPIONATE Inject 150 mg into the muscle once a week.       Allergies  Allergen Reactions   Doxycycline Hives, Swelling and Other (See Comments)    "I felt a little swelling in my throat," so he ceased taking it      The results of significant diagnostics from this hospitalization (including imaging, microbiology, ancillary and laboratory) are listed below for reference.    Significant Diagnostic Studies: ECHOCARDIOGRAM COMPLETE  Result Date: 03/20/2022    ECHOCARDIOGRAM REPORT   Patient Name:   Michael Choi [facility] Date of Exam: 03/20/2022  Medical Rec #:  [id number]   Height:       71.0 in Accession #:    [id number]  Weight:       242.2 lb Date of Birth:  10-05-1960   BSA:          2.287 m Patient Age:    23 years    BP:           145/96 mmHg Patient Gender: M           HR:           47 bpm. Exam Location:  Inpatient Procedure: 2D Echo, Cardiac Doppler and Color Doppler Indications:    Congestive Heart Failure I50.9  History:        Patient has prior history of Echocardiogram examinations, most                 recent 04/26/2021. Arrythmias:Atrial Fibrillation and Atrial  Discharge Summary  Michael Choi:[id number] DOB: 05/18/1961  PCP: [name], MD  Admit date: 03/19/2022 Discharge date: 03/20/2022  Time spent: 35 minutes  Recommendations for Outpatient Follow-up:  Follow-up with cardiology  Discharge Diagnoses:  Active Hospital Problems   Diagnosis Date Noted   Chest pain, rule out acute myocardial infarction 03/19/2022    Priority: High   Persistent atrial fibrillation ([place]) 10/27/2019    Priority: Low   HTN (hypertension) 10/13/2011    Priority: Low   Atypical chest pain    Syncope and collapse    Paroxysmal atrial fibrillation [facility])     Resolved Hospital Problems  No resolved problems to display.    Discharge Condition: Stable  Diet recommendation: Heart healthy diet  Vitals:   03/20/22 0820 03/20/22 1038  BP: 123/64 (!) 140/88  Pulse: 81 83  Resp: 17 18  Temp: 98.1 F (36.7 C) 97.8 F (36.6 C)  SpO2: 91% 96%    History of present illness:   Michael Choi is a 61 y.o. male with medical history significant of a.fib, HTN, NICM.   Pt with h/o persistent A.Fib despite multiple cardio versions + ablation(s), takes flecainide and BB, takes eliquis though with some gaps in dosing.   Today, just prior to ED arrival the patient felt left-sided chest pain which he notes is different from prior episodes of rapid A-fib.  No dyspnea, symptoms are worse with motion.  No abdominal pain nausea, vomiting. Last echo ~1 yr ago = EF mildly depressed (45-50%) but stable from prior.    03/20/2022: Seen and examined at his bedside.  Left chest wall pain on exam, reproducible on palpation.  Seen by cardiology, okay to go home.  Will follow-up outpatient.  Due to history of recent syncope, will discharge with ambulatory telemetry.  Hospital Course:  Principal Problem:   Chest pain, rule out acute myocardial infarction Active Problems:   HTN (hypertension)   Persistent atrial fibrillation ([facility])   Atypical chest pain   Syncope and collapse    Paroxysmal atrial fibrillation ([facility])  Chest pain, rule out acute myocardial infarction Seen by cardiology Chest pain is thought to be musculoskeletal High-sensitivity troponin negative x2 Echocardiogram LVEF 45 to 50%.   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 year-old male well developed well nourished in no acute distress.  Alert and oriented x3. Cardiovascular: Regular rate and rhythm with no rubs or gallops.  No thyromegaly or JVD noted.   Respiratory: Clear to auscultation with no wheezes or rales. Good inspiratory effort. Abdomen: Soft nontender nondistended with normal bowel sounds x4 quadrants. Musculoskeletal: No lower extremity edema. 2/4 pulses in all 4 extremities. Skin: No ulcerative lesions noted or rashes, Psychiatry: Mood is appropriate for condition and setting  Discharge Instructions You were cared for by a hospitalist during your hospital stay. If you have any questions about your discharge medications or the care you received

## 2022-03-20 NOTE — Progress Notes (Signed)
Pt c/o CP 7/10 described as "muscle soreness," which he states began after ambulating to the bathroom and is worse with deep breaths.  CP is not reproducible.  He also reports numbness "on the surface" of his left arm.  VSS.  EKG WNL.  O2 @ 2L/min via [place] applied.  Dr. Nevada Crane notified of the above.  Bedside shift report given to Oneita Kras, Therapist, sports.  Jodell Cipro

## 2022-03-20 NOTE — Care Management (Signed)
Transition of Care [facility]) Screening Note   Patient Details  Name: [name] Date of Birth: 1961/01/13   [facility] of [facility]) CM/SW Contact:    Gala Lewandowsky, RN Phone Number: 03/20/2022, 3:05 PM    Transition of Care Department [facility]) has reviewed the patient and no TOC needs have been identified at this time. We will continue to monitor patient advancement through interdisciplinary progression rounds. If new patient transition needs arise, please place a TOC consult.

## 2022-03-20 NOTE — Progress Notes (Signed)
Echocardiogram 2D Echocardiogram has been performed.  Michael Choi 03/20/2022, 8:55 AM

## 2022-03-22 ENCOUNTER — Telehealth: Payer: Self-pay | Admitting: Home Health

## 2022-03-22 NOTE — Telephone Encounter (Signed)
I-RHYTHM paged after hour line, reporting patient had one episode of A fib with RVR 108-152 bpm lasting 90 seconds on 03/22/22 at 2:27pm, he converted to sinus rhythm with PVCs at 330pm. Chart reviewed, patient has persistent A fib, on metoprolol, recently placed on monitor for syncope episode during inpatient consultation by cardiology. Called the patient three times and his spouse twice, no one answered. No option for voice mail.  He has A fib clinic follow up appointment arranged on 04/03/22.

## 2022-03-28 ENCOUNTER — Telehealth: Payer: Self-pay | Admitting: Internal Medicine

## 2022-03-28 NOTE — Telephone Encounter (Signed)
Received call from operator, patient transferred to triage.  Spoke with patient who reports HR of 40 (ranging 40-62), with BP of 115.75. He reports feeling lightheaded and SOB with activity. Patient states this improves when sitting.  Hx of persistent A-fib with multiple cardioversions and ablations, on anticoag therapy. Currently wearing live Zio heart monitor (ordered in ED 8/31, finishes 9/14). Patient reports he took Lopressor '25mg'$  this morning at 5:30am.  Reviewed discharge medication list with patient, Lopressor was discontinued and Toprol XL '50mg'$  QHS ordered. Due to symptoms and low heart rate, advised patient to hold metoprolol and to sit down and rest if feeling lightheaded/short of breath. Patient states he has only drank some coffee this morning, I encouraged him to increase intake of water today.  Patient has follow-up appointment with A-fib clinic on 04/03/22. Encouraged patient to keep this appointment.   Discussed with DOD Dr. Angelena Form, he agrees with the above.  Patient also reports he stepped outside of office building and felt better after being in the sunlight for a few minutes. Reviewed the above with patient, he verbalized understanding and expressed appreciation for call.

## 2022-03-28 NOTE — Telephone Encounter (Signed)
STAT if HR is under 50 or over 120 (normal HR is 60-100 beats per minute)  What is your heart rate? 40  Do you have a log of your heart rate readings (document readings)? No   Do you have any other symptoms? [email or web address]

## 2022-04-02 ENCOUNTER — Telehealth: Payer: Self-pay | Admitting: Internal Medicine

## 2022-04-02 NOTE — Telephone Encounter (Signed)
Spoke with pt who states he was seen in ED last month for CP and it was recommended pt have therapeutic phlebotomy for elevated hemoglobin.  Pt states he would like to have this done at the hospital rather than random [place] visits.  He will need order to have this done Pt advised Dr [name] is out of the office for the next 2 weeks and will forward to Oda Kilts, PA-C for his review and recommendation.

## 2022-04-02 NOTE — Telephone Encounter (Signed)
Attempted phone call to pt to advise of recommendation per Oda Kilts, PA-C as below.  Unable to leave voicemail message due to voicemail not being set up.   Shirley Friar, PA-C     Pt should see his PCP for this. There are intermittent recommendations and considerations for this in his chart. Most recently Dr. Nevada Crane in hospital mentioned that he should consider phlebotomy when getting testosterone injections.   Should follow up promptly with PCP and adjust from there; Not an issue we can manage as EP.

## 2022-04-02 NOTE — Telephone Encounter (Signed)
Spoke with pt and advised of Michael Choi's recommendation below to contact PCP regarding therapeutic phlebotomy.  Pt verbalizes understanding and agrees with current plan.

## 2022-04-02 NOTE — Telephone Encounter (Signed)
New Message::    Patient says he needs to Talk to Dr Olin Pia nurse. He said he was seen in the ER and they recommended that he have Therapeutic Phlettomy.

## 2022-04-03 ENCOUNTER — Ambulatory Visit (HOSPITAL_COMMUNITY): Payer: 59 | Admitting: Physician Assistant

## 2022-04-07 ENCOUNTER — Ambulatory Visit (HOSPITAL_COMMUNITY)
Admission: RE | Admit: 2022-04-07 | Discharge: 2022-04-07 | Disposition: A | Discharge status: Home / Self Care | Payer: 59 | Source: Ambulatory Visit | Attending: Physician Assistant | Admitting: Physician Assistant

## 2022-04-07 VITALS — BP 134/86 | HR 55 | Ht 71.0 in | Wt 241.4 lb

## 2022-04-07 DIAGNOSIS — D6869 Other thrombophilia: Secondary | ICD-10-CM | POA: Insufficient documentation

## 2022-04-07 DIAGNOSIS — I509 Heart failure, unspecified: Secondary | ICD-10-CM | POA: Diagnosis not present

## 2022-04-07 DIAGNOSIS — G4733 Obstructive sleep apnea (adult) (pediatric): Secondary | ICD-10-CM | POA: Insufficient documentation

## 2022-04-07 DIAGNOSIS — I428 Other cardiomyopathies: Secondary | ICD-10-CM | POA: Insufficient documentation

## 2022-04-07 DIAGNOSIS — I4819 Other persistent atrial fibrillation: Secondary | ICD-10-CM | POA: Insufficient documentation

## 2022-04-07 DIAGNOSIS — D751 Secondary polycythemia: Secondary | ICD-10-CM | POA: Diagnosis not present

## 2022-04-07 DIAGNOSIS — Z7901 Long term (current) use of anticoagulants: Secondary | ICD-10-CM | POA: Diagnosis not present

## 2022-04-07 DIAGNOSIS — I4892 Unspecified atrial flutter: Secondary | ICD-10-CM | POA: Diagnosis not present

## 2022-04-07 DIAGNOSIS — E669 Obesity, unspecified: Secondary | ICD-10-CM | POA: Diagnosis not present

## 2022-04-07 DIAGNOSIS — Z6833 Body mass index (BMI) 33.0-33.9, adult: Secondary | ICD-10-CM | POA: Diagnosis not present

## 2022-04-07 DIAGNOSIS — I110 Hypertensive heart disease with heart failure: Secondary | ICD-10-CM | POA: Diagnosis not present

## 2022-04-07 NOTE — Progress Notes (Signed)
Primary Care Physician: [name], MD Primary Electrophysiologist: Dr [name] Referring Physician: Forestine Na ER   Michael Choi is a 61 y.o. male with a history of atrial flutter s/p ablation 03/05/18 with Dr [name], NICM, HTN, OSA, polycythemia and persistent atrial fibrillation who presents for follow up in the [facility]. Patient reports that on 03/05/19 he had palpitations and SOB which lasted about an hour and then again later that night which persisted. He presented to the ER and was found to be in rate controlled afib. He did not undergo DCCV as his anticoagulation was stopped post flutter ablation and the duration of his present episode was unknown. Patient reports that he has been under a lot of stress with being out of work. Patient is s/p TEE/DCCV on 03/18/19. ECG post cardioversion showed NSR. Unfortunately, patient had ERAF.   Patient underwent DCCV on 07/18/20 and again had early return of afib. Seen by Dr Rayann Heman and had afib ablation 08/23/20. Seen in follow up 01/04/21 and was back in afib and underwent another DCCV on 01/18/21. Started on flecainide.   Patient went to the ED 03/20/22 with chest discomfort. Work up was unremarkable. He was given a cardiac monitor at discharge, results pending. He self discontinued flecainide and has felt better off the medication.   On follow up today, patient is in SR, no recent symptoms of afib. He still has a mild aching sensation in his chest. No other symptoms at this time. No bleeding issues on anticoagulation.   Today, he denies symptoms of palpitations, orthopnea, PND, lower extremity edema, dizziness, presyncope, syncope, snoring, daytime somnolence, bleeding, or neurologic sequela. The patient is tolerating medications without difficulties and is otherwise without complaint today.    Atrial Fibrillation Risk Factors:  he does have symptoms or diagnosis of sleep apnea.  He is not on CPAP. He denies any alcohol  use. He denies history of rheumatic fever.   he has a BMI of Body mass index is 33.67 kg/m.Marland Kitchen Filed Weights   04/07/22 0859  Weight: 109.5 kg    Family History  Problem Relation Age of Onset   Cancer Mother    Heart disease Father        rheumatic heart disease s/p transplant   Hypertension Brother      Atrial Fibrillation Management history:  Previous antiarrhythmic drugs: flecainide  Previous cardioversions: 11/2017, 03/18/19, 07/18/20, 01/18/21 Previous ablations: 03/05/18 flutter, 08/23/20 CHADS2VASC score: 2 Anticoagulation history: Eliquis   Past Medical History:  Diagnosis Date   Atrial flutter [facility])    s/p CTI ablation by Dr [name]   Atypical nevus 04/16/2016   left outer back (mild)    Hypertension    Melanoma ([place])    stage I,  resected.  did not require chemotherapy or radiation   Persistent atrial fibrillation [facility])    Past Surgical History:  Procedure Laterality Date   A-FLUTTER ABLATION N/A 03/05/2018   Procedure: A-FLUTTER ABLATION;  Surgeon: [name], MD;  Location: [place] CV LAB;  Service: Cardiovascular;  Laterality: N/A;   ATRIAL FIBRILLATION ABLATION N/A 08/23/2020   Procedure: ATRIAL FIBRILLATION ABLATION;  Surgeon: [name], MD;  Location: [place] CV LAB;  Service: Cardiovascular;  Laterality: N/A;   CARDIOVERSION N/A 11/27/2017   Procedure: CARDIOVERSION;  Surgeon: [name], MD;  Location: [place] ENDOSCOPY;  Service: Cardiovascular;  Laterality: N/A;   CARDIOVERSION N/A 03/18/2019   Procedure: CARDIOVERSION;  Surgeon: [name], MD;  Location: [facility]  ENDOSCOPY;  Service: Cardiovascular;  Laterality: N/A;   CARDIOVERSION N/A 07/18/2020   Procedure: CARDIOVERSION;  Surgeon: [name], MD;  Location: [facility] ENDOSCOPY;  Service: Cardiovascular;  Laterality: N/A;   CARDIOVERSION N/A 01/18/2021   Procedure: CARDIOVERSION;  Surgeon: [name], MD;  Location: [place];  Service: Cardiovascular;  Laterality: N/A;    KNEE ARTHROSCOPY     TEE WITHOUT CARDIOVERSION N/A 11/27/2017   Procedure: TRANSESOPHAGEAL ECHOCARDIOGRAM (TEE);  Surgeon: [name], MD;  Location: [facility] ENDOSCOPY;  Service: Cardiovascular;  Laterality: N/A;   TEE WITHOUT CARDIOVERSION N/A 03/18/2019   Procedure: TRANSESOPHAGEAL ECHOCARDIOGRAM (TEE);  Surgeon: [name], MD;  Location: [facility] ENDOSCOPY;  Service: Cardiovascular;  Laterality: N/A;    Current Outpatient Medications  Medication Sig Dispense Refill   amLODipine (NORVASC) 5 MG tablet Take 5 mg by mouth daily.     ELIQUIS 5 MG TABS tablet TAKE 1 TABLET BY MOUTH TWICE A DAY 60 tablet 5   losartan (COZAAR) 50 MG tablet Take 50 mg by mouth daily.  3   metoprolol tartrate (LOPRESSOR) 25 MG tablet Take 25 mg by mouth 2 (two) times daily.     testosterone cypionate (DEPOTESTOSTERONE CYPIONATE) 200 MG/ML injection Inject 150 mg into the muscle once a week.     No current facility-administered medications for this encounter.    Allergies  Allergen Reactions   Doxycycline Hives, Swelling and Other (See Comments)    "I felt a little swelling in my throat," so he ceased taking it    Social History   Socioeconomic History   Marital status: Single    Spouse name: Not on file   Number of children: Not on file   Years of education: Not on file   Highest education level: Not on file  Occupational History   Not on file  Tobacco Use   Smoking status: Never   Smokeless tobacco: Never  Vaping Use   Vaping Use: Never used  Substance and Sexual Activity   Alcohol use: Yes    Alcohol/week: 1.0 standard drink of alcohol    Types: 1 Standard drinks or equivalent per week    Comment: glass of wine twice per month   Drug use: No   Sexual activity: Not on file  Other Topics Concern   Not on file  Social History Narrative   Lives in [place] with fiance   Works with department of public safety   Social Determinants of Health   Financial Resource Strain: Not on file   Food Insecurity: Not on file  Transportation Needs: Not on file  Physical Activity: Not on file  Stress: Not on file  Social Connections: Not on file  Intimate Partner Violence: Not on file     ROS- All systems are reviewed and negative except as per the HPI above.  Physical Exam: Vitals:   04/07/22 0859  BP: 134/86  Pulse: (!) 55  Weight: 109.5 kg  Height: '5\' 11"'$  (1.803 m)    GEN- The patient is a well appearing obese male, alert and oriented x 3 today.   HEENT-head normocephalic, atraumatic, sclera clear, conjunctiva pink, hearing intact, trachea midline. Lungs- Clear to ausculation bilaterally, normal work of breathing Heart- Regular rate and rhythm, no murmurs, rubs or gallops  GI- soft, NT, ND, + BS Extremities- no clubbing, cyanosis, or edema MS- no significant deformity or atrophy Skin- no rash or lesion Psych- euthymic mood, full affect Neuro- strength and sensation are intact   Wt Readings from  Last 3 Encounters:  04/07/22 109.5 kg  03/20/22 109.9 kg  06/07/21 112.4 kg    EKG today demonstrates  SR, short PR, PVC Vent. rate 55 BPM PR interval 118 ms QRS duration 94 ms QT/QTcB 402/384 ms  Echo 03/20/22 demonstrated  1. Left ventricular ejection fraction, by estimation, is 45 to 50%. Left  ventricular ejection fraction by PLAX is 43 %. The left ventricle has  mildly decreased function. The left ventricle demonstrates global  hypokinesis. The left ventricular internal cavity size was mildly dilated. Left ventricular diastolic function could not be evaluated.   2. Right ventricular systolic function is normal. The right ventricular  size is normal. There is normal pulmonary artery systolic pressure.   3. Left atrial size was severely dilated.   4. Right atrial size was severely dilated.   5. The mitral valve is normal in structure. Trivial mitral valve  regurgitation. No evidence of mitral stenosis.   6. The aortic valve is tricuspid. Aortic valve  regurgitation is trivial.  No aortic stenosis is present.   7. Aortic dilatation noted. There is moderate dilatation of the aortic  root, measuring 45 mm. There is borderline dilatation of the ascending  aorta, measuring 36 mm.   8. The inferior vena cava is dilated in size with >50% respiratory  variability, suggesting right atrial pressure of 8 mmHg.    Epic records are reviewed at length today   CHA2DS2-VASc Score = 2  The patient's score is based upon: CHF History: 1 HTN History: 1 Diabetes History: 0 Stroke History: 0 Vascular Disease History: 0 Age Score: 0 Gender Score: 0       ASSESSMENT AND PLAN: 1. Persistent Atrial Fibrillation/atrial flutter The patient's CHA2DS2-VASc score is 2, indicating a 2.2% annual risk of stroke.   S/p flutter ablation 03/05/18.  Patient in [place] today. We discussed the likelihood of afib recurrence given his severe LA enlargement. He would like to pursue watchful waiting for now. Could consider dofetilide.  Continue Eliquis 5 mg BID  Continue Lopressor 25 mg BID (never started Toprol)  2. Secondary Hypercoagulable State (ICD10:  D68.69) The patient is at significant risk for stroke/thromboembolism based upon his CHA2DS2-VASc Score of 2.  Continue Apixaban (Eliquis).   3. Obesity Body mass index is 33.67 kg/m. Lifestyle modification was discussed and encouraged including regular physical activity and weight reduction.  4. Obstructive sleep apnea Patient is not on CPAP therapy. Severely dilated RA  5. HTN Stable, no changes today.  6. Nonischemic Cardiomyopathy EF improved to 45-50% by last echo.  Fluid status appears stable today.   Follow up in the AF clinic in 3 months.    [facility] [address] [place], [place] [address] [phone] 04/07/2022 4:36 PM

## 2022-04-11 NOTE — Addendum Note (Signed)
Encounter addended by: Carylon Perches, CMA on: 04/11/2022 3:02 PM  Actions taken: Imaging Exam ended

## 2022-04-15 ENCOUNTER — Telehealth: Payer: Self-pay | Admitting: Internal Medicine

## 2022-04-15 NOTE — Telephone Encounter (Signed)
Spoke with pt who reports he was recently in the ED and had a hemoglobin greater than 18.  He was instructed to contact his PCP to follow up however PCP appt was going to be several weeks out.  Pt is asking to have repeat blood work and requests Dr [name] order it.  Reviewed with pt Hemoglobin has been 17.3 or above for the past 4 years.  Advised I will forward this information to Dr [name] for his review however with it being chronic, following up with PCP for further evaluation and treatment options, in a few weeks, would be OK.  Pt aware he will be contacted with any further instructions.

## 2022-04-15 NOTE — Telephone Encounter (Signed)
Patient would like to get orders for a blood draw.

## 2022-04-16 NOTE — Telephone Encounter (Signed)
Spoke with pt and advised per previous call and recommendation from Oda Kilts, PA-C pt will need to follow up with his PCP for further evaluation and recommendation.  Pt verbalizes understanding and states he has seen a doctor at [facility] in [place] and is trying to coordinate an appointment with their office.  Pt thanked Therapist, sports for the call.

## 2022-04-22 ENCOUNTER — Telehealth: Payer: Self-pay | Admitting: Internal Medicine

## 2022-04-22 NOTE — Telephone Encounter (Signed)
   Primary Cardiologist: None  Chart reviewed as part of pre-operative protocol coverage. Simple dental extractions are considered low risk procedures per guidelines and generally do not require any specific cardiac clearance. It is also generally accepted that for simple extractions and dental cleanings, there is no need to interrupt blood thinner therapy.   SBE prophylaxis is not required for the patient.  I will route this recommendation to the requesting party via Epic fax function and remove from pre-op pool.  Please call with questions.  Emmaline Life, NP-C  04/22/2022, 10:52 AM [address] Office [phone] Fax [phone]

## 2022-04-22 NOTE — Telephone Encounter (Signed)
Pre-operative Risk Assessment    Patient Name: Michael Choi  DOB: 1960-09-08 MRN: [id number]     Request for Surgical Clearance    Procedure:  Dental Extraction - Amount of Teeth to be Pulled:  -1 tooth surgically extracted  Date of Surgery:  Clearance TBD                                 Surgeon:  Dr Alanson Puls Surgeon's Group or Practice Name:   Phone number:   [phone] Fax number:  [phone]   Type of Clearance Requested:   - Pharmacy:  Hold Apixaban (Eliquis) - can he hold his eliquis 5 days prior to surgery   Type of Anesthesia:  Local    Additional requests/questions:    Signed, Laurence Ferrari   04/22/2022, 9:08 AM

## 2022-04-23 ENCOUNTER — Telehealth: Payer: Self-pay | Admitting: Internal Medicine

## 2022-04-23 NOTE — Telephone Encounter (Signed)
I will forward this back to pre op provider. See clearance notes .

## 2022-04-23 NOTE — Telephone Encounter (Signed)
It is not recommended that Eliquis is held for one surgical tooth extraction.    Emmaline Life, NP-C  04/23/2022, 4:04 PM [address], [address] Office [phone] Fax [phone]

## 2022-04-23 NOTE — Telephone Encounter (Signed)
Michael Choi is calling back regarding the clearance received yesterday regarding previous telephone note for a surgical tooth extraction. She reports the fax they received states it is a simple extraction when it is not. She is requesting a callback for confirmation that eliquis does not need to be held for the surgical procedure. Please advise.

## 2022-04-23 NOTE — Telephone Encounter (Signed)
I resent clearance stating that it is not recommended to hold Eliquis for 1 tooth extraction.

## 2022-05-15 ENCOUNTER — Ambulatory Visit: Payer: 59 | Admitting: Internal Medicine

## 2022-06-27 ENCOUNTER — Other Ambulatory Visit: Payer: Self-pay | Admitting: Internal Medicine

## 2022-07-08 ENCOUNTER — Encounter (HOSPITAL_COMMUNITY): Payer: Self-pay

## 2022-07-08 ENCOUNTER — Ambulatory Visit (HOSPITAL_COMMUNITY): Payer: 59 | Admitting: Physician Assistant

## 2022-07-08 NOTE — Progress Notes (Incomplete)
Primary Care Physician: [name], MD Primary Electrophysiologist: Dr [name] Referring Physician: Forestine Na ER   Michael Choi is a 61 y.o. male with a history of atrial flutter s/p ablation 03/05/18 with Dr [name], NICM, HTN, OSA, polycythemia and persistent atrial fibrillation who presents for follow up in the [facility]. Patient reports that on 03/05/19 he had palpitations and SOB which lasted about an hour and then again later that night which persisted. He presented to the ER and was found to be in rate controlled afib. He did not undergo DCCV as his anticoagulation was stopped post flutter ablation and the duration of his present episode was unknown. Patient reports that he has been under a lot of stress with being out of work. Patient is s/p TEE/DCCV on 03/18/19. ECG post cardioversion showed NSR. Unfortunately, patient had ERAF.   Patient underwent DCCV on 07/18/20 and again had early return of afib. Seen by Dr Rayann Heman and had afib ablation 08/23/20. Seen in follow up 01/04/21 and was back in afib and underwent another DCCV on 01/18/21. Started on flecainide.   Patient went to the ED 03/20/22 with chest discomfort. Work up was unremarkable. He was given a cardiac monitor at discharge, results pending. He self discontinued flecainide and has felt better off the medication.   On follow up today, ***  Today, he denies symptoms of ***palpitations, orthopnea, PND, lower extremity edema, dizziness, presyncope, syncope, snoring, daytime somnolence, bleeding, or neurologic sequela. The patient is tolerating medications without difficulties and is otherwise without complaint today.    Atrial Fibrillation Risk Factors:  he does have symptoms or diagnosis of sleep apnea.  He is not on CPAP. He denies any alcohol use. He denies history of rheumatic fever.   he has a BMI of There is no height or weight on file to calculate BMI.. There were no vitals filed for this  visit.   Family History  Problem Relation Age of Onset   Cancer Mother    Heart disease Father        rheumatic heart disease s/p transplant   Hypertension Brother      Atrial Fibrillation Management history:  Previous antiarrhythmic drugs: flecainide  Previous cardioversions: 11/2017, 03/18/19, 07/18/20, 01/18/21 Previous ablations: 03/05/18 flutter, 08/23/20 CHADS2VASC score: 2 Anticoagulation history: Eliquis   Past Medical History:  Diagnosis Date   Atrial flutter [facility])    s/p CTI ablation by Dr [name]   Atypical nevus 04/16/2016   left outer back (mild)    Hypertension    Melanoma ([place])    stage I,  resected.  did not require chemotherapy or radiation   Persistent atrial fibrillation [facility])    Past Surgical History:  Procedure Laterality Date   A-FLUTTER ABLATION N/A 03/05/2018   Procedure: A-FLUTTER ABLATION;  Surgeon: [name], MD;  Location: [place] CV LAB;  Service: Cardiovascular;  Laterality: N/A;   ATRIAL FIBRILLATION ABLATION N/A 08/23/2020   Procedure: ATRIAL FIBRILLATION ABLATION;  Surgeon: [name], MD;  Location: [place] CV LAB;  Service: Cardiovascular;  Laterality: N/A;   CARDIOVERSION N/A 11/27/2017   Procedure: CARDIOVERSION;  Surgeon: [name], MD;  Location: [place] ENDOSCOPY;  Service: Cardiovascular;  Laterality: N/A;   CARDIOVERSION N/A 03/18/2019   Procedure: CARDIOVERSION;  Surgeon: [name], MD;  Location: [facility] ENDOSCOPY;  Service: Cardiovascular;  Laterality: N/A;   CARDIOVERSION N/A 07/18/2020   Procedure: CARDIOVERSION;  Surgeon: [name], MD;  Location: [place];  Service:  Cardiovascular;  Laterality: N/A;   CARDIOVERSION N/A 01/18/2021   Procedure: CARDIOVERSION;  Surgeon: [name], MD;  Location: [place];  Service: Cardiovascular;  Laterality: N/A;   KNEE ARTHROSCOPY     TEE WITHOUT CARDIOVERSION N/A 11/27/2017   Procedure: TRANSESOPHAGEAL ECHOCARDIOGRAM (TEE);  Surgeon: [name], MD;  Location: [facility] ENDOSCOPY;  Service: Cardiovascular;  Laterality: N/A;   TEE WITHOUT CARDIOVERSION N/A 03/18/2019   Procedure: TRANSESOPHAGEAL ECHOCARDIOGRAM (TEE);  Surgeon: [name], MD;  Location: [facility] ENDOSCOPY;  Service: Cardiovascular;  Laterality: N/A;    Current Outpatient Medications  Medication Sig Dispense Refill   amLODipine (NORVASC) 5 MG tablet Take 5 mg by mouth daily.     ELIQUIS 5 MG TABS tablet TAKE 1 TABLET BY MOUTH TWICE A DAY 60 tablet 5   losartan (COZAAR) 50 MG tablet Take 50 mg by mouth daily.  3   metoprolol tartrate (LOPRESSOR) 25 MG tablet Take 25 mg by mouth 2 (two) times daily.     testosterone cypionate (DEPOTESTOSTERONE CYPIONATE) 200 MG/ML injection Inject 150 mg into the muscle once a week.     No current facility-administered medications for this visit.    Allergies  Allergen Reactions   Doxycycline Hives, Swelling and Other (See Comments)    "I felt a little swelling in my throat," so he ceased taking it    Social History   Socioeconomic History   Marital status: Single    Spouse name: Not on file   Number of children: Not on file   Years of education: Not on file   Highest education level: Not on file  Occupational History   Not on file  Tobacco Use   Smoking status: Never   Smokeless tobacco: Never  Vaping Use   Vaping Use: Never used  Substance and Sexual Activity   Alcohol use: Yes    Alcohol/week: 1.0 standard drink of alcohol    Types: 1 Standard drinks or equivalent per week    Comment: glass of wine twice per month   Drug use: No   Sexual activity: Not on file  Other Topics Concern   Not on file  Social History Narrative   Lives in [place] with fiance   Works with department of public safety   Social Determinants of Health   Financial Resource Strain: Not on file  Food Insecurity: Not on file  Transportation Needs: Not on file  Physical Activity: Not on file  Stress: Not on file  Social Connections: Not  on file  Intimate Partner Violence: Not on file     ROS- All systems are reviewed and negative except as per the HPI above.  Physical Exam: There were no vitals filed for this visit.   GEN- The patient is a well appearing *** {Desc; male/male:[id number]}, alert and oriented x 3 today.   HEENT-head normocephalic, atraumatic, sclera clear, conjunctiva pink, hearing intact, trachea midline. Lungs- Clear to ausculation bilaterally, normal work of breathing Heart- ***Regular rate and rhythm, no murmurs, rubs or gallops  GI- soft, NT, ND, + BS Extremities- no clubbing, cyanosis, or edema MS- no significant deformity or atrophy Skin- no rash or lesion Psych- euthymic mood, full affect Neuro- strength and sensation are intact   Wt Readings from Last 3 Encounters:  04/07/22 109.5 kg  03/20/22 109.9 kg  06/07/21 112.4 kg    EKG today demonstrates  ***  Echo 03/20/22 demonstrated  1. Left ventricular ejection fraction, by estimation, is 45 to 50%. Left  ventricular  ejection fraction by PLAX is 43 %. The left ventricle has  mildly decreased function. The left ventricle demonstrates global  hypokinesis. The left ventricular internal cavity size was mildly dilated. Left ventricular diastolic function could not be evaluated.   2. Right ventricular systolic function is normal. The right ventricular  size is normal. There is normal pulmonary artery systolic pressure.   3. Left atrial size was severely dilated.   4. Right atrial size was severely dilated.   5. The mitral valve is normal in structure. Trivial mitral valve  regurgitation. No evidence of mitral stenosis.   6. The aortic valve is tricuspid. Aortic valve regurgitation is trivial.  No aortic stenosis is present.   7. Aortic dilatation noted. There is moderate dilatation of the aortic  root, measuring 45 mm. There is borderline dilatation of the ascending  aorta, measuring 36 mm.   8. The inferior vena cava is dilated in size with  >50% respiratory  variability, suggesting right atrial pressure of 8 mmHg.    Epic records are reviewed at length today   CHA2DS2-VASc Score = 2  The patient's score is based upon: CHF History: 1 HTN History: 1 Diabetes History: 0 Stroke History: 0 Vascular Disease History: 0 Age Score: 0 Gender Score: 0       ASSESSMENT AND PLAN: 1. Persistent Atrial Fibrillation/atrial flutter The patient's CHA2DS2-VASc score is 2, indicating a 2.2% annual risk of stroke.   S/p flutter ablation 03/05/18 Zio monitor showed <1% afib burden, not associated with patient symptom triggers. *** Patient has severe biatrial enlargement. Could consider dofetilide if AAD is needed.  Continue Eliquis 5 mg BID  Continue Lopressor 25 mg BID  2. Secondary Hypercoagulable State (ICD10:  D68.69) The patient is at significant risk for stroke/thromboembolism based upon his CHA2DS2-VASc Score of 2.  Continue Apixaban (Eliquis).   3. Obesity There is no height or weight on file to calculate BMI. Lifestyle modification was discussed and encouraged including regular physical activity and weight reduction. ***  4. Obstructive sleep apnea Patient is not on CPAP therapy. Severely dilated RA  5. HTN Stable, no changes today. ***  6. Nonischemic Cardiomyopathy EF improved to 45-50%  Appears euvolemic. ***   Follow up ***   Adline Peals PA-C [facility] [address] [place], [place] [address] [phone] 07/08/2022 8:20 AM

## 2022-07-09 ENCOUNTER — Other Ambulatory Visit: Payer: Self-pay | Admitting: Surgery

## 2022-07-09 DIAGNOSIS — R194 Change in bowel habit: Secondary | ICD-10-CM

## 2022-07-09 DIAGNOSIS — I428 Other cardiomyopathies: Secondary | ICD-10-CM

## 2022-07-09 DIAGNOSIS — I4811 Longstanding persistent atrial fibrillation: Secondary | ICD-10-CM

## 2022-07-09 DIAGNOSIS — R1084 Generalized abdominal pain: Secondary | ICD-10-CM

## 2022-07-09 DIAGNOSIS — Z6830 Body mass index (BMI) 30.0-30.9, adult: Secondary | ICD-10-CM

## 2022-07-09 DIAGNOSIS — G4733 Obstructive sleep apnea (adult) (pediatric): Secondary | ICD-10-CM

## 2022-07-09 DIAGNOSIS — Z789 Other specified health status: Secondary | ICD-10-CM

## 2022-07-09 DIAGNOSIS — Z7901 Long term (current) use of anticoagulants: Secondary | ICD-10-CM

## 2022-07-24 ENCOUNTER — Encounter: Payer: Self-pay | Admitting: Gastroenterology

## 2022-08-20 ENCOUNTER — Ambulatory Visit: Payer: 59 | Admitting: Gastroenterology

## 2022-09-12 ENCOUNTER — Ambulatory Visit: Payer: 59 | Admitting: Gastroenterology

## 2022-09-12 ENCOUNTER — Encounter: Payer: Self-pay | Admitting: Gastroenterology

## 2022-09-12 ENCOUNTER — Telehealth: Payer: Self-pay

## 2022-09-12 VITALS — BP 126/76 | HR 64 | Ht 71.0 in | Wt 245.0 lb

## 2022-09-12 DIAGNOSIS — K5900 Constipation, unspecified: Secondary | ICD-10-CM

## 2022-09-12 DIAGNOSIS — Z1212 Encounter for screening for malignant neoplasm of rectum: Secondary | ICD-10-CM

## 2022-09-12 DIAGNOSIS — Z1211 Encounter for screening for malignant neoplasm of colon: Secondary | ICD-10-CM

## 2022-09-12 DIAGNOSIS — K625 Hemorrhage of anus and rectum: Secondary | ICD-10-CM

## 2022-09-12 DIAGNOSIS — K219 Gastro-esophageal reflux disease without esophagitis: Secondary | ICD-10-CM

## 2022-09-12 DIAGNOSIS — R1084 Generalized abdominal pain: Secondary | ICD-10-CM

## 2022-09-12 DIAGNOSIS — R1310 Dysphagia, unspecified: Secondary | ICD-10-CM | POA: Diagnosis not present

## 2022-09-12 MED ORDER — SUTAB 1479-225-188 MG PO TABS: ORAL_TABLET | ORAL | 0 refills | Status: DC

## 2022-09-12 NOTE — Patient Instructions (Addendum)
You have been scheduled for a colonoscopy. Please follow written instructions given to you at your visit today.  Please pick up your prep supplies at the pharmacy within the next 1-3 days. If you use inhalers (even only as needed), please bring them with you on the day of your procedure.  Start Miralax or metamucil daily 1 capful with 8 oz of water.  Use Linzess daily as needed.   _______________________________________________________  If your blood pressure at your visit was 140/90 or greater, please contact your primary care physician to follow up on this.  _______________________________________________________  If you are age 75 or older, your body mass index should be between 23-30. Your Body mass index is 34.17 kg/m. If this is out of the aforementioned range listed, please consider follow up with your Primary Care Provider.  If you are age 8 or younger, your body mass index should be between 19-25. Your Body mass index is 34.17 kg/m. If this is out of the aformentioned range listed, please consider follow up with your Primary Care Provider.   ________________________________________________________  The  GI providers would like to encourage you to use [facility] to communicate with providers for non-urgent requests or questions.  Due to long hold times on the telephone, sending your provider a message by [facility] may be a faster and more efficient way to get a response.  Please allow 48 business hours for a response.  Please remember that this is for non-urgent requests.  _______________________________________________________ It was a pleasure to see you today!  Thank you for trusting me with your gastrointestinal care!

## 2022-09-12 NOTE — Telephone Encounter (Signed)
Patient with diagnosis of afib/aflutter on Eliquis for anticoagulation.    Procedure: colonoscopy/EGD Date of procedure: 10/08/22  CHA2DS2-VASc Score = 2  This indicates a 2.2% annual risk of stroke. The patient's score is based upon: CHF History: 1 HTN History: 1 Diabetes History: 0 Stroke History: 0 Vascular Disease History: 0 Age Score: 0 Gender Score: 0  CrCl 38m/min Platelet count 220K  Per office protocol, patient can hold Eliquis for 2 days prior to procedure.    **This guidance is not considered finalized until pre-operative APP has relayed final recommendations.**

## 2022-09-12 NOTE — Telephone Encounter (Signed)
[facility] Pre-operative Risk Assessment     Request for surgical clearance:     Endoscopy Procedure  What type of surgery is being performed?     Colonoscopy/egd  When is this surgery scheduled?     10/08/2022  What type of clearance is required ?   Pharmacy  Are there any medications that need to be held prior to surgery and how long? Eliquis and 2 days  Practice name and name of physician performing surgery?      [facility]  What is your office phone and fax number?      Phone- [phone]  [phone]  Anesthesia type (None, local, MAC, general) ?       MAC

## 2022-09-12 NOTE — Progress Notes (Unsigned)
HPI : Tedman Lilly is a very pleasant 62 year old male with a history of atrial fibrillation on Eliquis who is referred to [place] by Dorcas Mcmurray, NP for symptoms of abdominal pain, constipation and rectal bleeding.  He has never had a colonoscopy or any other colon cancer screening test. He reports having issues with constipation and abdominal discomfort that started around the time he started taking metoprolol for his a-fib.  Although he has regular bowel movements, his stools are small volume and unsatisfactory.  He typically has to strain to evacuate stools.  He was prescribed Linzess by another provider and states that this works very well in terms of producing multiple large bowel movements, but he typically only takes it on the weekends He denies problems with diarrhea. He reports generalized abdominal discomfort that is more noticed in the upper abdomen and is more noticeable when he has gone several days without a satisfactory bowel movement.  Typically this discomfort improves with large bowel movements.   He states that he had a large bloody bowel movement in the past (many months ago), and has continued to see occasional bright red blood per rectum on the toilet paper.  He has not had any further episodes of large volume hematochezia.   He denies any other perianal symptoms such as pain with passage of stool, perianal pain, itching or swelling.   Although weight loss was reported as a symptom, the patient's weight appears stable in the EMR at around 240-245lbs.  The patient states that he did lose some weight when he stopped weight lifting because of his a-fib.  He also reports long standing GERD symptoms of heartburn and acid regurgitation.  This has been going on for several years and he typically has symptoms at least once a week.  Symptoms are more likely to be precipitated by certain foods, such as spicy foods, tomato-based foods and sauces.  When he drank alcohol this would cause  symptoms, but he stopped drinking alcohol because of his a-fib.  He does report occasional solid dysphagia, but denies ever having to forcefully vomit stuck food. He takes as needed medications such as Pepto, prilosec or TUMs when he has symptoms.  He was seen by General Surgery in December for umbilical hernia, but his primary complaints were constipation and abdominal pain.  A CT scan was ordered by Dr. Johney Maine, but it does not appear this was completed.   His hernia was not felt to be the source of his symptoms and surgery was not recommended at that time.  He has no family history of GI malignancy.  His atrial fibrillation has been persistent despite multiple ablations and cardioversions.  His rate is well controlled with metoprolol.  He has stopped his Eliquis in the past for dental work.   Past Medical History:  Diagnosis Date   Atrial flutter [facility])    s/p CTI ablation by Dr [name]   Atypical nevus 04/16/2016   left outer back (mild)    Hypertension    Melanoma ([place]    stage I,  resected.  did not require chemotherapy or radiation   Persistent atrial fibrillation [facility])      Past Surgical History:  Procedure Laterality Date   A-FLUTTER ABLATION N/A 03/05/2018   Procedure: A-FLUTTER ABLATION;  Surgeon: [name], MD;  Location: [place] [facility];  Service: Cardiovascular;  Laterality: N/A;   ATRIAL FIBRILLATION ABLATION N/A 08/23/2020   Procedure: ATRIAL FIBRILLATION ABLATION;  Surgeon: [name], MD;  Location: [place] [facility];  Service: Cardiovascular;  Laterality: N/A;   CARDIOVERSION N/A 11/27/2017   Procedure: CARDIOVERSION;  Surgeon: [name], MD;  Location: [place] [facility];  Service: Cardiovascular;  Laterality: N/A;   CARDIOVERSION N/A 03/18/2019   Procedure: CARDIOVERSION;  Surgeon: [name], MD;  Location: [facility];  Service: Cardiovascular;  Laterality: N/A;   CARDIOVERSION N/A 07/18/2020   Procedure: CARDIOVERSION;  Surgeon: [name], MD;  Location: [facility];  Service: Cardiovascular;  Laterality: N/A;   CARDIOVERSION N/A 01/18/2021   Procedure: CARDIOVERSION;  Surgeon: [name], MD;  Location: [place];  Service: Cardiovascular;  Laterality: N/A;   KNEE ARTHROSCOPY     TEE WITHOUT CARDIOVERSION N/A 11/27/2017   Procedure: TRANSESOPHAGEAL ECHOCARDIOGRAM (TEE);  Surgeon: [name], MD;  Location: [facility] ENDOSCOPY;  Service: Cardiovascular;  Laterality: N/A;   TEE WITHOUT CARDIOVERSION N/A 03/18/2019   Procedure: TRANSESOPHAGEAL ECHOCARDIOGRAM (TEE);  Surgeon: [name], MD;  Location: [facility] ENDOSCOPY;  Service: Cardiovascular;  Laterality: N/A;   Family History  Problem Relation Age of Onset   Cancer Mother    Heart disease Father        rheumatic heart disease s/p transplant   Hypertension Brother    Social History   Tobacco Use   Smoking status: Never   Smokeless tobacco: Never  Vaping Use   Vaping Use: Never used  Substance Use Topics   Alcohol use: Yes    Alcohol/week: 1.0 standard drink of alcohol    Types: 1 Standard drinks or equivalent per week    Comment: glass of wine twice per month   Drug use: No   Current Outpatient Medications  Medication Sig Dispense Refill   amLODipine (NORVASC) 5 MG tablet Take 5 mg by mouth daily.     ELIQUIS 5 MG TABS tablet TAKE 1 TABLET BY MOUTH TWICE A DAY 60 tablet 5   losartan (COZAAR) 50 MG tablet Take 50 mg by mouth daily.  3   metoprolol tartrate (LOPRESSOR) 25 MG tablet Take 25 mg by mouth 2 (two) times daily.     testosterone cypionate (DEPOTESTOSTERONE CYPIONATE) 200 MG/ML injection Inject 150 mg into the muscle once a week.     No current facility-administered medications for this visit.   Allergies  Allergen Reactions   Doxycycline Hives, Swelling and Other (See Comments)    "I felt a little swelling in my throat," so he ceased taking it     Review of Systems: All systems reviewed and negative except where noted in HPI.     No results found.  Physical Exam: Ht '5\' 11"'$  (1.803 m)   Wt 245 lb (111.1 kg)   BMI 34.17 kg/m  Constitutional: Pleasant,well-developed, Caucasian male in no acute distress. HEENT: Normocephalic and atraumatic. Conjunctivae are normal. No scleral icterus. Neck supple.  Cardiovascular: Normal rate, irregular rhythm.  Pulmonary/chest: Effort normal and breath sounds normal. No wheezing, rales or rhonchi. Abdominal: Soft, nondistended, nontender. Bowel sounds active throughout. There are no masses palpable. No hepatomegaly.  Small, soft, reducible umbilical hernia, mildly tender to palpation Extremities: no edema Neurological: Alert and oriented to person place and time. Skin: Skin is warm and dry. No rashes noted. Psychiatric: Normal mood and affect. Behavior is normal.  CBC    Component Value Date/Time   WBC 7.5 03/20/2022 1451   RBC 5.64 03/20/2022 1451   HGB 18.0 (H) 03/20/2022 1451   HGB 17.9 (H) 08/10/2020 1044   HCT 52.5 (H) 03/20/2022 1451  HCT 52.4 (H) 08/10/2020 1044   PLT 220 03/20/2022 1451   PLT 244 08/10/2020 1044   MCV 93.1 03/20/2022 1451   MCV 93 08/10/2020 1044   MCH 31.9 03/20/2022 1451   MCHC 34.3 03/20/2022 1451   RDW 13.1 03/20/2022 1451   RDW 13.2 08/10/2020 1044   LYMPHSABS 1.8 03/20/2022 1451   LYMPHSABS 2.0 08/10/2020 1044   MONOABS 0.9 03/20/2022 1451   EOSABS 0.2 03/20/2022 1451   EOSABS 0.1 08/10/2020 1044   BASOSABS 0.0 03/20/2022 1451   BASOSABS 0.0 08/10/2020 1044    CMP     Component Value Date/Time   NA 139 03/20/2022 1451   NA 139 08/10/2020 1044   K 4.0 03/20/2022 1451   CL 105 03/20/2022 1451   CO2 27 03/20/2022 1451   GLUCOSE 82 03/20/2022 1451   BUN 12 03/20/2022 1451   BUN 17 08/10/2020 1044   CREATININE 1.22 03/20/2022 1451   CREATININE 1.34 (H) 08/07/2017 1211   CALCIUM 9.0 03/20/2022 1451   PROT 7.1 03/20/2022 1451   ALBUMIN 3.6 03/20/2022 1451   AST 26 03/20/2022 1451   AST 38 (H) 08/07/2017 1211   ALT 34  03/20/2022 1451   ALT 51 08/07/2017 1211   ALKPHOS 50 03/20/2022 1451   BILITOT 0.9 03/20/2022 1451   BILITOT 0.8 08/07/2017 1211   GFRNONAA >60 03/20/2022 1451   GFRNONAA 57 (L) 08/07/2017 1211   GFRAA 60 08/10/2020 1044   GFRAA >60 08/07/2017 1211     ASSESSMENT AND PLAN: 62 year old male with chronic abdominal discomfort and constipation manifested as small volume stools with difficulty evacuating, also with intermittent bright red blood per rectum.  I suspect his abdominal discomfort is likely secondary to chronic constipation, and that this will improve with better bowel movements.  I suggested he start taking either MiraLax or metamucil on a daily basis.  He can continue to take the Linzess on an as-needed basis. His recurrent painless hematochezia is most likely secondary to internal hemorrhoids based on his description of symptoms, but a mass lesion needs to be excluded. He is overdue for his initial colon cancer screening and has multiple GI symptoms that warrant an endoscopic evaluation. Because of his chronic GERD symptoms and solid dysphagia, I recommended an EGD to screen for Barrett's esophagus, reflux esophagitis and peptic stricture/Schatzki ring and dilate if indicated.  He can continue to take medications as needed, but if any of these GERD complications are found, he should take a PPI daily. Will plan to hold Eliquis 2 days (day before and day of procedure).  Will request clearance from cardiology.  Colon cancer screening - Colonoscopy - Hold Eliquis 2 days, letter to cardiology  Constipation - MiraLax or Metamucil daily - Linzess PRN  Hematochezia, presumed hemorrhoids - Colonoscopy will exclude other etiologies - Daily MiraLax or Metamucil as above  Dysphagia/GERD - EGD to exclude complications of GERD - Dilation if ring/stricture present - Continue PRN medications for now  Abdominal pain - Suspect related to constipation - Colonoscopy will exclude luminal  etiology  The details, risks (including bleeding, perforation, infection, missed lesions, medication reactions and possible hospitalization or surgery if complications occur), benefits, and alternatives to EGD/colonoscopy with possible biopsy, dilation and polypectomy were discussed with the patient and he consents to proceed.   Cicero Noy E. Candis Schatz, MD [facility]   CC:  Everardo Beals, NP

## 2022-09-12 NOTE — Telephone Encounter (Signed)
Patient Name: Michael Choi  DOB: 23-Oct-1960 MRN: [id number]  Primary Cardiologist: None  Clinical pharmacists have reviewed the patient's past medical history, labs, and current medications as part of preoperative protocol coverage. The following recommendations have been made:  Patient with diagnosis of afib/aflutter on Eliquis for anticoagulation.     Procedure: colonoscopy/EGD Date of procedure: 10/08/22   CHA2DS2-VASc Score = 2  This indicates a 2.2% annual risk of stroke. The patient's score is based upon: CHF History: 1 HTN History: 1 Diabetes History: 0 Stroke History: 0 Vascular Disease History: 0 Age Score: 0 Gender Score: 0   CrCl 35mL/min Platelet count 220K   Per office protocol, patient can hold Eliquis for 2 days prior to procedure. Please resume Eliquis as soon as possible postprocedure, at the discretion of the surgeon.    I will route this recommendation to the requesting party via Epic fax function and remove from pre-op pool.  Please call with questions.  Joylene Grapes, NP 09/12/2022, 4:00 PM

## 2022-09-12 NOTE — Telephone Encounter (Signed)
Tried to contact pt , pt did not have voicemail set up/09/12/2022 4:26pm

## 2022-09-22 ENCOUNTER — Other Ambulatory Visit: Payer: Self-pay

## 2022-09-22 ENCOUNTER — Other Ambulatory Visit: Payer: Self-pay | Admitting: Pharmacist

## 2022-09-22 DIAGNOSIS — I4819 Other persistent atrial fibrillation: Secondary | ICD-10-CM

## 2022-09-22 MED ORDER — METOPROLOL TARTRATE 25 MG PO TABS: 25.0000 mg | ORAL_TABLET | Freq: Two times a day (BID) | ORAL | 0 refills | Status: DC

## 2022-09-22 MED ORDER — APIXABAN 5 MG PO TABS: 5.0000 mg | ORAL_TABLET | Freq: Two times a day (BID) | ORAL | 5 refills | Status: DC

## 2022-09-22 NOTE — Telephone Encounter (Signed)
Prescription refill request for Eliquis received. Indication: a fib Last office visit: 04/07/22 Scr: 1.22 (03/20/22) Age: 62 Weight: 111kg

## 2022-10-03 NOTE — Telephone Encounter (Signed)
Patient is calling to check up on how long to hold blood thinner. [phone]. Please advise

## 2022-10-08 ENCOUNTER — Encounter: Payer: Self-pay | Admitting: Gastroenterology

## 2022-10-08 ENCOUNTER — Ambulatory Visit (AMBULATORY_SURGERY_CENTER): Payer: 59 | Admitting: Gastroenterology

## 2022-10-08 VITALS — BP 128/63 | HR 61 | Temp 97.5°F | Resp 17 | Ht 71.0 in | Wt 245.0 lb

## 2022-10-08 DIAGNOSIS — K2970 Gastritis, unspecified, without bleeding: Secondary | ICD-10-CM

## 2022-10-08 DIAGNOSIS — K2100 Gastro-esophageal reflux disease with esophagitis, without bleeding: Secondary | ICD-10-CM

## 2022-10-08 DIAGNOSIS — D124 Benign neoplasm of descending colon: Secondary | ICD-10-CM

## 2022-10-08 DIAGNOSIS — D123 Benign neoplasm of transverse colon: Secondary | ICD-10-CM

## 2022-10-08 DIAGNOSIS — K635 Polyp of colon: Secondary | ICD-10-CM | POA: Diagnosis not present

## 2022-10-08 DIAGNOSIS — Z1211 Encounter for screening for malignant neoplasm of colon: Secondary | ICD-10-CM

## 2022-10-08 DIAGNOSIS — K219 Gastro-esophageal reflux disease without esophagitis: Secondary | ICD-10-CM

## 2022-10-08 MED ORDER — SODIUM CHLORIDE 0.9 % IV SOLN: 500.0000 mL | Freq: Once | INTRAVENOUS | Status: DC

## 2022-10-08 MED ORDER — OMEPRAZOLE 40 MG PO CPDR: 40.0000 mg | DELAYED_RELEASE_CAPSULE | Freq: Every day | ORAL | 0 refills | Status: DC

## 2022-10-08 NOTE — Patient Instructions (Signed)
Please read handouts provided. Continue present medications. Await pathology results. Use Prilosec ( omeprazole ) 40 mg daily for 8 weeks. Resume previous diet. Resume Eliquis ( apixaban ) at prior dose tomorrow. Recommend daily fiber supplement ( Metamucil ) to help with constipation and reduce hemorrhoidal symptoms.   YOU HAD AN ENDOSCOPIC PROCEDURE TODAY AT [facility]:   Refer to the procedure report that was given to you for any specific questions about what was found during the examination.  If the procedure report does not answer your questions, please call your gastroenterologist to clarify.  If you requested that your care partner not be given the details of your procedure findings, then the procedure report has been included in a sealed envelope for you to review at your convenience later.  YOU SHOULD EXPECT: Some feelings of bloating in the abdomen. Passage of more gas than usual.  Walking can help get rid of the air that was put into your GI tract during the procedure and reduce the bloating. If you had a lower endoscopy (such as a colonoscopy or flexible sigmoidoscopy) you may notice spotting of blood in your stool or on the toilet paper. If you underwent a bowel prep for your procedure, you may not have a normal bowel movement for a few days.  Please Note:  You might notice some irritation and congestion in your nose or some drainage.  This is from the oxygen used during your procedure.  There is no need for concern and it should clear up in a day or so.  SYMPTOMS TO REPORT IMMEDIATELY:  Following lower endoscopy (colonoscopy or flexible sigmoidoscopy):  Excessive amounts of blood in the stool  Significant tenderness or worsening of abdominal pains  Swelling of the abdomen that is new, acute  Fever of 100F or higher  Following upper endoscopy (EGD)  Vomiting of blood or coffee ground material  New chest pain or pain under the shoulder blades  Painful or  persistently difficult swallowing  New shortness of breath  Fever of 100F or higher  Black, tarry-looking stools  For urgent or emergent issues, a gastroenterologist can be reached at any hour by calling [phone]. Do not use MyChart messaging for urgent concerns.    DIET:  We do recommend a small meal at first, but then you may proceed to your regular diet.  Drink plenty of fluids but you should avoid alcoholic beverages for 24 hours.  ACTIVITY:  You should plan to take it easy for the rest of today and you should NOT DRIVE or use heavy machinery until tomorrow (because of the sedation medicines used during the test).    FOLLOW UP: Our staff will call the number listed on your records the next business day following your procedure.  We will call around 7:15- 8:00 am to check on you and address any questions or concerns that you may have regarding the information given to you following your procedure. If we do not reach you, we will leave a message.     If any biopsies were taken you will be contacted by phone or by letter within the next 1-3 weeks.  Please call us at [phone] if you have not heard about the biopsies in 3 weeks.    SIGNATURES/CONFIDENTIALITY: You and/or your care partner have signed paperwork which will be entered into your electronic medical record.  These signatures attest to the fact that that the information above on your After Visit Summary has been reviewed  and is understood.  Full responsibility of the confidentiality of this discharge information lies with you and/or your care-partner. 

## 2022-10-08 NOTE — Progress Notes (Signed)
History and Physical Interval Note:  10/08/2022 2:34 PM  Michael Choi  has presented today for endoscopic procedure(s), with the diagnosis of  Encounter Diagnoses  Name Primary?   Gastroesophageal reflux disease, unspecified whether esophagitis present Yes   Screening for colorectal cancer   .  The various methods of evaluation and treatment have been discussed with the patient and/or family. After consideration of risks, benefits and other options for treatment, the patient has consented to  the endoscopic procedure(s).   The patient's history has been reviewed, patient examined, no change in status, stable for endoscopic procedure(s).  I have reviewed the patient's chart and labs.  Questions were answered to the patient's satisfaction.     Williams Dietrick E. Candis Schatz, MD [facility]

## 2022-10-08 NOTE — Op Note (Signed)
[facility] Patient Name: Michael Choi Procedure Date: 10/08/2022 2:31 PM MRN: [id number] Endoscopist: Lorin Picket E. Tomasa Rand , MD, [id number] Age: 62 Referring MD:  Date of Birth: 10/07/1960 Gender: Male Account #: [id number] Procedure:                Colonoscopy Indications:              Screening for colorectal malignant neoplasm, This                            is the patient's first colonoscopy Medicines:                Monitored Anesthesia Care Procedure:                Pre-Anesthesia Assessment:                           - Prior to the procedure, a History and Physical                            was performed, and patient medications and                            allergies were reviewed. The patient's tolerance of                            previous anesthesia was also reviewed. The risks                            and benefits of the procedure and the sedation                            options and risks were discussed with the patient.                            All questions were answered, and informed consent                            was obtained. Prior Anticoagulants: The patient has                            taken Eliquis (apixaban), last dose was 3 days                            prior to procedure. ASA Grade Assessment: II - A                            patient with mild systemic disease. After reviewing                            the risks and benefits, the patient was deemed in                            satisfactory condition to undergo the procedure.  After obtaining informed consent, the colonoscope                            was passed under direct vision. Throughout the                            procedure, the patient's blood pressure, pulse, and                            oxygen saturations were monitored continuously. The                            CF [id number] #[id number] was introduced through the anus                             and advanced to the the cecum, identified by                            appendiceal orifice and ileocecal valve. The                            colonoscopy was performed without difficulty. The                            patient tolerated the procedure well. The quality                            of the bowel preparation was adequate. The terminal                            ileum, ileocecal valve, appendiceal orifice, and                            rectum were photographed. The bowel preparation                            used was SUPREP via split dose instruction. Scope In: 2:54:50 PM Scope Out: 3:15:03 PM Scope Withdrawal Time: 0 hours 17 minutes 44 seconds  Total Procedure Duration: 0 hours 20 minutes 13 seconds  Findings:                 Hemorrhoids were found on perianal exam.                           Three sessile polyps were found in the transverse                            colon. The polyps were 2 to 4 mm in size. These                            polyps were removed with a cold snare. Resection                            and retrieval  were complete. Estimated blood loss                            was minimal.                           A 4 mm polyp was found in the descending colon. The                            polyp was sessile. The polyp was removed with a                            cold snare. Resection and retrieval were complete.                            Estimated blood loss was minimal.                           The exam was otherwise normal throughout the                            examined colon.                           The retroflexed view of the distal rectum and anal                            verge was normal and showed no anal or rectal                            abnormalities. Complications:            No immediate complications. Estimated Blood Loss:     Estimated blood loss was minimal. Impression:               - Hemorrhoids found on perianal exam.                            - Three 2 to 4 mm polyps in the transverse colon,                            removed with a cold snare. Resected and retrieved.                           - One 4 mm polyp in the descending colon, removed                            with a cold snare. Resected and retrieved.                           - The distal rectum and anal verge are normal on                            retroflexion view.                           -  The GI Genius (intelligent endoscopy module),                            computer-aided polyp detection system powered by AI                            was utilized to detect colorectal polyps through                            enhanced visualization during colonoscopy. Recommendation:           - Patient has a contact number available for                            emergencies. The signs and symptoms of potential                            delayed complications were discussed with the                            patient. Return to normal activities tomorrow.                            Written discharge instructions were provided to the                            patient.                           - Resume previous diet.                           - Resume Eliquis (apixaban) at prior dose tomorrow.                           - Await pathology results.                           - Repeat colonoscopy (date not yet determined) for                            surveillance based on pathology results.                           - Recommend daily fiber supplement (Metamucil) to                            help with constipation and reduce hemorrhoidal                            symptoms. [place] Jeff E. Tomasa Rand, MD 10/08/2022 3:34:03 PM This report has been signed electronically.

## 2022-10-08 NOTE — Progress Notes (Signed)
Called to room to assist during endoscopic procedure.  Patient ID and intended procedure confirmed with present staff. Received instructions for my participation in the procedure from the performing physician.  

## 2022-10-08 NOTE — Op Note (Addendum)
[facility] Patient Name: Michael Choi Procedure Date: 10/08/2022 2:34 PM MRN: [id number] Endoscopist: Lorin Picket E. Tomasa Rand , MD, [id number] Age: 62 Referring MD:  Date of Birth: Jul 30, 1960 Gender: Male Account #: [id number] Procedure:                Upper GI endoscopy Indications:              Dysphagia, Suspected gastro-esophageal reflux                            disease Medicines:                Monitored Anesthesia Care Procedure:                Pre-Anesthesia Assessment:                           - Prior to the procedure, a History and Physical                            was performed, and patient medications and                            allergies were reviewed. The patient's tolerance of                            previous anesthesia was also reviewed. The risks                            and benefits of the procedure and the sedation                            options and risks were discussed with the patient.                            All questions were answered, and informed consent                            was obtained. Prior Anticoagulants: The patient has                            taken Eliquis (apixaban), last dose was 3 days                            prior to procedure. ASA Grade Assessment: II - A                            patient with mild systemic disease. After reviewing                            the risks and benefits, the patient was deemed in                            satisfactory condition to undergo the procedure.  After obtaining informed consent, the endoscope was                            passed under direct vision. Throughout the                            procedure, the patient's blood pressure, pulse, and                            oxygen saturations were monitored continuously. The                            GIF HQ190 #[id number] was introduced through the                            mouth, and advanced to the second  part of duodenum.                            The upper GI endoscopy was accomplished without                            difficulty. The patient tolerated the procedure                            well. Scope In: Scope Out: Findings:                 The examined portions of the nasopharynx,                            oropharynx and larynx were normal.                           LA Grade A (one or more mucosal breaks less than 5                            mm, not extending between tops of 2 mucosal folds)                            esophagitis with no bleeding was found at the                            gastroesophageal junction. A TTS dilator was passed                            through the scope. Dilation with an 18-19-20 mm                            balloon dilator was performed to 20 mm. The                            dilation site was examined and showed no change.  Estimated blood loss: none.                           The exam of the esophagus was otherwise normal.                           Biopsies were obtained from the proximal and distal                            esophagus with cold forceps for histology of                            suspected eosinophilic esophagitis. Estimated blood                            loss was minimal.                           Diffuse mild inflammation characterized by erythema                            and granularity was found in the gastric antrum.                            Biopsies were taken with a cold forceps for                            Helicobacter pylori testing.                           The exam of the stomach was otherwise normal.                           The examined duodenum was normal. Complications:            No immediate complications. Estimated Blood Loss:     Estimated blood loss was minimal. Impression:               - The examined portions of the nasopharynx,                            oropharynx  and larynx were normal.                           - LA Grade A reflux esophagitis with no bleeding.                            Dilated.                           - Gastritis. Biopsied.                           - Normal examined duodenum.                           - Biopsies were taken with  a cold forceps for                            evaluation of eosinophilic esophagitis.                           - Suspect patient's dysphagia is secondary to                            reflux esophagitis. Recommendation:           - Patient has a contact number available for                            emergencies. The signs and symptoms of potential                            delayed complications were discussed with the                            patient. Return to normal activities tomorrow.                            Written discharge instructions were provided to the                            patient.                           - Resume previous diet.                           - Use Prilosec (omeprazole) 40 mg PO daily for 8                            weeks.                           - Await pathology results.                           - Resume Eliquis (apixaban) at prior dose tomorrow. [name], MD 10/08/2022 3:26:13 PM This report has been signed electronically.

## 2022-10-08 NOTE — Progress Notes (Signed)
VS completed by EC.   Pt's states no medical or surgical changes since previsit or office visit.  

## 2022-10-08 NOTE — Progress Notes (Signed)
Report to PACU, RN, vss, BBS= Clear.  

## 2022-10-09 ENCOUNTER — Telehealth: Payer: Self-pay | Admitting: *Deleted

## 2022-10-09 NOTE — Telephone Encounter (Signed)
  Follow up Call-     10/08/2022    2:22 PM  Call back number  Post procedure Call Back phone  # [phone]  Permission to leave phone message Yes     Patient questions:  Do you have a fever, pain , or abdominal swelling? No. Pain Score  0 *  Have you tolerated food without any problems? Yes.    Have you been able to return to your normal activities? Yes.    Do you have any questions about your discharge instructions: Diet   No. Medications  No. Follow up visit  No.  Do you have questions or concerns about your Care? No.  Actions: * If pain score is 4 or above: No action needed, pain <4.

## 2022-10-14 ENCOUNTER — Other Ambulatory Visit: Payer: Self-pay | Admitting: Physician Assistant

## 2022-10-21 NOTE — Progress Notes (Signed)
Michael Choi,  The biopsies of your stomach were unremarkable.  There was no evidence of inflammation or H. Pylori infection. The biopsies of your esophagus were also unremarkable.  There was no evidence of eosinophilic esophagitis.  Please take the acid-suppressing medication daily as discussed for 8 weeks to heal the esophagitis and see if your swallowing improves.   Please follow up with in the office if your symptoms do not improve.   Several polyps that I removed during your colonoscopy were completely benign but were proven to be "pre-cancerous" polyps that MAY have grown into cancers if they had not been removed.  Studies shows that at least 20% of women over age 27 and 30% of men over age 37 have pre-cancerous polyps.  Based on current nationally recognized surveillance guidelines, I recommend that you have a repeat colonoscopy in 5 years.

## 2022-10-31 ENCOUNTER — Telehealth: Payer: Self-pay | Admitting: Internal Medicine

## 2022-10-31 NOTE — Telephone Encounter (Signed)
Pt called in to report low HR.  Reports hasn't felt well since around 7:30 am. Took metoprolol 12.5 mg around 3 am and took 12.5 mg around 10 am.  Then felt really bad fatigue, dizzy, chest tightness describes as pin pricks.  Went to nurse at place of employment HR in 40's.  BP 150/80, 130's/70's.  HR 40-60. Has not taken Flecainide in a month or two.  Medication no longer on pt med list.  Advised pt sounds like experiencing symptomatic bradycardia; ED visit warranted.  Pt reports does not have the money to go to ED.  Would like to know Dr. Odessa Fleming recommendation.  Advised will send message to MD and nurse to f/u but ED is my recommendation.

## 2022-10-31 NOTE — Telephone Encounter (Signed)
Spoke with pt who states he believes he was in atrial fib earlier this morning.  He normally now takes Metoprolol Tartrate 25mg  - 1/2 tablet by mouth twice daily.  He has also stopped his Flecainide due to a near syncopal event a few months ago.  Pt reports he has just returned from lunch and feels much better.  He denies current chest discomfort or dizziness.  He reports he still feels fatigued.  He does not feel as if he is currently in Afib.  He is not certain what his heart rate is now but will check it this evening with his pulse Ox at home. He states he is taking Eliquis as prescribed. Appointment scheduled with Virgina Norfolk for 11/05/2022 at 9am.  Continue to monitor HR and BP.  Reviewed ED precautions and advised it is best to not stop or start medications without speaking with provider first.  Pt verbalizes understanding and thanked RN for the call.

## 2022-10-31 NOTE — Telephone Encounter (Signed)
STAT if HR is under 50 or over 120 (normal HR is 60-100 beats per minute)  What is your heart rate?  40   Do you have a log of your heart rate readings (document readings)?  HR is at 40 right now  Do you have any other symptoms?  SOB, numbness in extremities, sharp pain in chest

## 2022-11-03 NOTE — Progress Notes (Signed)
  Electrophysiology Office Note:   Date:  11/05/2022  ID:  Michael Choi, DOB 1960-12-25, MRN [id number]  Primary Cardiologist: None Electrophysiologist: Hillis Range, MD (Inactive)   History of Present Illness:   Michael Choi is a 62 y.o. male with h/o AF s/p ablation 08/2020, AFL s/p ablation in 2019, SVT, HTN, and NICM  seen today for routine electrophysiology followup.   Since last being seen in our clinic the patient reports doing poorly, with episodes of fatigue and SOB.  He had one day when he had two episodes of very near syncope, preceded by numbness and tingling in his hands and feet.  Initially, on phone note he said this was "months ago" but on further questioning he says it was "near when he started flecainide, which would've been nearly 2 years ago. He has since only taken flecainide as needed, even as little as a quarter of a tablet.  His HR at times gets down into the 30-40s by pulse ox, has not been caught on EKG.   Review of systems complete and found to be negative unless listed in HPI.   Studies Reviewed:    EKG today shows NSR with brief run of SVT and ?PACs at 69 bpm. Personal review.    Risk Assessment/Calculations:       Physical Exam:   VS:  BP (!) 146/82   Pulse 83   Ht 5\' 11"  (1.803 m)   Wt 249 lb 12.8 oz (113.3 kg)   SpO2 98%   BMI 34.84 kg/m    Wt Readings from Last 3 Encounters:  11/05/22 249 lb 12.8 oz (113.3 kg)  10/08/22 245 lb (111.1 kg)  09/12/22 245 lb (111.1 kg)     GEN: Well nourished, well developed in no acute distress NECK: No JVD; No carotid bruits CARDIAC: Irregular rate and rhythm with frequent ectopy, no murmurs, rubs, gallops RESPIRATORY:  Clear to auscultation without rales, wheezing or rhonchi  ABDOMEN: Soft, non-tender, non-distended EXTREMITIES:  No edema; No deformity   ASSESSMENT AND PLAN:    Paroxysmal AF Atrial flutter SVT S/p CTI 2019 S/p PVI 08/2020 EKG today shows NSR with brief run of SVT and ?PACs at 69  bpm Continue eliquis for CHA2DS2VASc  of at least 2 Would not re challenge flecainide with reports of very near syncope vs syncope, bradycardia, and EF <50%. Continue lopressor 12.5 mg BID for now.  Refuses tikosyn admission at this juncture, in addition it is unclear if AF or bradycardia is his symptoms.  Declines re-do ablation consideration as well.   ? If his "bradycardia" is bradysphygmia in setting of very frequent ectopy.   We need more information as to what is driving his symptoms for a better treatment plan.  Will place 2 week Zio with close follow up and may need to consider loop.    Tikosyn and getting off BB may help smooth out his arrhythmias and prevent bradycardia.  Today he has brief periods of SVT, PVCs, and PACs. EF 45-50 in August.   HTN Stable on current regimen   NICM EF 45-50% by Echo 02/2022 Continue losartan and Lopressor, previously had been on toprol Will sort out his symptoms to better guide GDMT.    Follow up with Dr. Graciela Husbands in 4 weeks  Signed, Graciella Freer, PA-C

## 2022-11-05 ENCOUNTER — Encounter: Payer: Self-pay | Admitting: Student

## 2022-11-05 ENCOUNTER — Other Ambulatory Visit (INDEPENDENT_AMBULATORY_CARE_PROVIDER_SITE_OTHER): Payer: 59

## 2022-11-05 ENCOUNTER — Ambulatory Visit: Payer: 59 | Attending: Student | Admitting: Student

## 2022-11-05 VITALS — BP 146/82 | HR 83 | Ht 71.0 in | Wt 249.8 lb

## 2022-11-05 DIAGNOSIS — I10 Essential (primary) hypertension: Secondary | ICD-10-CM | POA: Diagnosis not present

## 2022-11-05 DIAGNOSIS — I4819 Other persistent atrial fibrillation: Secondary | ICD-10-CM

## 2022-11-05 DIAGNOSIS — I428 Other cardiomyopathies: Secondary | ICD-10-CM

## 2022-11-05 NOTE — Progress Notes (Unsigned)
Enrolled patient for a 14 day ZIO AT monitor to be mailed to patients home  Dr Graciela Husbands to read

## 2022-11-05 NOTE — Patient Instructions (Signed)
Medication Instructions:  Your physician recommends that you continue on your current medications as directed. Please refer to the Current Medication list given to you today.  *If you need a refill on your cardiac medications before your next appointment, please call your pharmacy*  Lab Work: None If you have labs (blood work) drawn today and your tests are completely normal, you will receive your results only by: MyChart Message (if you have MyChart) OR A paper copy in the mail If you have any lab test that is abnormal or we need to change your treatment, we will call you to review the results.  Testing/Procedures: Christena Deem- Long Term Monitor Instructions  Your physician has requested you wear a ZIO patch monitor for 14 days.  This is a single patch monitor. [employer] supplies one patch monitor per enrollment. Additional stickers are not available. Please do not apply patch if you will be having a Nuclear Stress Test,  Echocardiogram, Cardiac CT, MRI, or Chest Xray during the period you would be wearing the  monitor. The patch cannot be worn during these tests. You cannot remove and re-apply the  ZIO XT patch monitor.  Your ZIO patch monitor will be mailed 3 day USPS to your address on file. It may take 3-5 days  to receive your monitor after you have been enrolled.  Once you have received your monitor, please review the enclosed instructions. Your monitor  has already been registered assigning a specific monitor serial # to you.  Billing and Patient Assistance Program Information  We have supplied [employer] with any of your insurance information on file for billing purposes. [employer] offers a sliding scale Patient Assistance Program for patients that do not have  insurance, or whose insurance does not completely cover the cost of the ZIO monitor.  You must apply for the Patient Assistance Program to qualify for this discounted rate.  To apply, please call [employer] at [phone], select  option 4, select option 2, ask to apply for  Patient Assistance Program. Meredeth Ide will ask your household income, and how many people  are in your household. They will quote your out-of-pocket cost based on that information.  [employer] will also be able to set up a 86-month, interest-free payment plan if needed.  Applying the monitor   Shave hair from upper left chest.  Hold abrader disc by orange tab. Rub abrader in 40 strokes over the upper left chest as  indicated in your monitor instructions.  Clean area with 4 enclosed alcohol pads. Let dry.  Apply patch as indicated in monitor instructions. Patch will be placed under collarbone on left  side of chest with arrow pointing upward.  Rub patch adhesive wings for 2 minutes. Remove white label marked "1". Remove the white  label marked "2". Rub patch adhesive wings for 2 additional minutes.  While looking in a mirror, press and release button in center of patch. A small green light will  flash 3-4 times. This will be your only indicator that the monitor has been turned on.  Do not shower for the first 24 hours. You may shower after the first 24 hours.  Press the button if you feel a symptom. You will hear a small click. Record Date, Time and  Symptom in the Patient Logbook.  When you are ready to remove the patch, follow instructions on the last 2 pages of Patient  Logbook. Stick patch monitor onto the last page of Patient Logbook.  Place Patient Logbook in the blue  and white box. Use locking tab on box and tape box closed  securely. The blue and white box has prepaid postage on it. Please place it in the mailbox as  soon as possible. Your physician should have your test results approximately 7 days after the  monitor has been mailed back to [facility].  Call [facility] Customer Care at [phone] if you have questions regarding  your ZIO XT patch monitor. Call them immediately if you see an orange light blinking on your  monitor.   If your monitor falls off in less than 4 days, contact our Monitor department at [phone].  If your monitor becomes loose or falls off after 4 days call [employer] at [phone] for  suggestions on securing your monitor   Follow-Up: At [facility], you and your health needs are our priority.  As part of our continuing mission to provide you with exceptional heart care, we have created designated Provider Care Teams.  These Care Teams include your primary Cardiologist (physician) and Advanced Practice Providers (APPs -  Physician Assistants and Nurse Practitioners) who all work together to provide you with the care you need, when you need it.  Your next appointment:   11/28/2022 at 10:00 AM  Provider:   Sherryl Manges, MD    Other Instructions [employer]  FDA-cleared EKG at your fingertips. - [employer]   [employer], [employer]. [email or web address].[email or web address]   FDA-cleared, clinical grade mobile EKG monitor: Lourena Simmonds is the most clinically-validated mobile EKG used by the world's leading cardiac care medical professionals.  This may be useful in monitoring palpitations.  We do not have access to have them emailed and reviewed but will be glad to review while in the office.

## 2022-11-05 NOTE — Telephone Encounter (Signed)
Note that he is seen in offcie today

## 2022-11-11 ENCOUNTER — Other Ambulatory Visit: Payer: Self-pay | Admitting: Gastroenterology

## 2022-11-15 ENCOUNTER — Other Ambulatory Visit: Payer: Self-pay | Admitting: Physician Assistant

## 2022-11-17 ENCOUNTER — Telehealth: Payer: Self-pay | Admitting: Internal Medicine

## 2022-11-17 NOTE — Telephone Encounter (Deleted)
Spoke with patient, having chest pain that intermittently worsens since Saturday. Patient states currently he can tell the chest pain is there but feels as if he can ignore it. Nothing has seemed to improve this chest pain. Patient also condones having slightly worse shortness of breath, dizziness, and extreme fatigue. Recently seen in office 4/17 but patient states its worse than before. I suggested it would be in his best interest to be evaluated by the emergency department. He agreed and would work out transportation there.

## 2022-11-17 NOTE — Telephone Encounter (Signed)
Spoke with patient, having chest pain that intermittently worsens since Saturday. Patient states currently he can tell the chest pain is there but feels as if he can ignore it. Nothing has seemed to improve this chest pain. Patient also condones having slightly worse shortness of breath, dizziness, and extreme fatigue. Recently seen in office 4/17 but patient states its worse than before. I suggested it would be in his best interest to be evaluated by the emergency department. He agreed and stated he would work it out to get transportation there.

## 2022-11-17 NOTE — Telephone Encounter (Signed)
Pt c/o of Chest Pain: STAT if CP now or developed within 24 hours  1. Are you having CP right now? No, but it's still been going on and off for two days   2. Are you experiencing any other symptoms (ex. SOB, nausea, vomiting, sweating)? SOB, sweat, dizzy   3. How long have you been experiencing CP? 2 days   4. Is your CP continuous or coming and going? Coming and going   5. Have you taken Nitroglycerin? No  ?

## 2022-11-20 ENCOUNTER — Emergency Department (HOSPITAL_COMMUNITY)
Admission: EM | Admit: 2022-11-20 | Discharge: 2022-11-21 | Disposition: A | Discharge status: Home / Self Care | Payer: 59 | Attending: Emergency Medicine | Admitting: Emergency Medicine

## 2022-11-20 ENCOUNTER — Emergency Department (HOSPITAL_COMMUNITY): Payer: 59

## 2022-11-20 ENCOUNTER — Encounter (HOSPITAL_COMMUNITY): Payer: Self-pay | Admitting: Emergency Medicine

## 2022-11-20 DIAGNOSIS — R519 Headache, unspecified: Secondary | ICD-10-CM | POA: Insufficient documentation

## 2022-11-20 DIAGNOSIS — I4892 Unspecified atrial flutter: Secondary | ICD-10-CM | POA: Diagnosis not present

## 2022-11-20 DIAGNOSIS — Z7901 Long term (current) use of anticoagulants: Secondary | ICD-10-CM | POA: Diagnosis not present

## 2022-11-20 DIAGNOSIS — R079 Chest pain, unspecified: Secondary | ICD-10-CM | POA: Diagnosis present

## 2022-11-20 LAB — COMPREHENSIVE METABOLIC PANEL
ALT: 36 U/L (ref 0–44)
AST: 45 U/L — ABNORMAL HIGH (ref 15–41)
Albumin: 3.6 g/dL (ref 3.5–5.0)
Alkaline Phosphatase: 37 U/L — ABNORMAL LOW (ref 38–126)
Anion gap: 11 (ref 5–15)
BUN: 17 mg/dL (ref 8–23)
CO2: 22 mmol/L (ref 22–32)
Calcium: 8.8 mg/dL — ABNORMAL LOW (ref 8.9–10.3)
Chloride: 103 mmol/L (ref 98–111)
Creatinine, Ser: 1.34 mg/dL — ABNORMAL HIGH (ref 0.61–1.24)
GFR, Estimated: 60 mL/min — ABNORMAL LOW (ref >60)
Glucose, Bld: 161 mg/dL — ABNORMAL HIGH (ref 70–99)
Potassium: 4.2 mmol/L (ref 3.5–5.1)
Sodium: 136 mmol/L (ref 135–145)
Total Bilirubin: 0.9 mg/dL (ref 0.3–1.2)
Total Protein: 6.9 g/dL (ref 6.5–8.1)

## 2022-11-20 LAB — CBC WITH DIFFERENTIAL/PLATELET
Abs Immature Granulocytes: 0.02 10*3/uL (ref 0.00–0.07)
Basophils Absolute: 0 10*3/uL (ref 0.0–0.1)
Basophils Relative: 0 %
Eosinophils Absolute: 0.1 10*3/uL (ref 0.0–0.5)
Eosinophils Relative: 2 %
HCT: 53.8 % — ABNORMAL HIGH (ref 39.0–52.0)
Hemoglobin: 18 g/dL — ABNORMAL HIGH (ref 13.0–17.0)
Immature Granulocytes: 0 %
Lymphocytes Relative: 22 %
Lymphs Abs: 2 10*3/uL (ref 0.7–4.0)
MCH: 31.6 pg (ref 26.0–34.0)
MCHC: 33.5 g/dL (ref 30.0–36.0)
MCV: 94.4 fL (ref 80.0–100.0)
Monocytes Absolute: 1 10*3/uL (ref 0.1–1.0)
Monocytes Relative: 11 %
Neutro Abs: 5.8 10*3/uL (ref 1.7–7.7)
Neutrophils Relative %: 65 %
Platelets: 242 10*3/uL (ref 150–400)
RBC: 5.7 MIL/uL (ref 4.22–5.81)
RDW: 13.5 % (ref 11.5–15.5)
WBC: 8.9 10*3/uL (ref 4.0–10.5)
nRBC: 0 % (ref 0.0–0.2)

## 2022-11-20 LAB — TROPONIN I (HIGH SENSITIVITY): Troponin I (High Sensitivity): 50 ng/L — ABNORMAL HIGH (ref <18)

## 2022-11-20 LAB — MAGNESIUM: Magnesium: 2 mg/dL (ref 1.7–2.4)

## 2022-11-20 MED ORDER — FENTANYL CITRATE PF 50 MCG/ML IJ SOSY
50.0000 ug | PREFILLED_SYRINGE | Freq: Once | INTRAMUSCULAR | Status: AC
  Administered 2022-11-20: 50 ug via INTRAVENOUS
  Filled 2022-11-20: qty 1

## 2022-11-20 MED ORDER — METOPROLOL TARTRATE 5 MG/5ML IV SOLN
2.5000 mg | Freq: Once | INTRAVENOUS | Status: AC
  Administered 2022-11-20: 2.5 mg via INTRAVENOUS
  Filled 2022-11-20: qty 5

## 2022-11-20 NOTE — ED Triage Notes (Addendum)
Pt been wearing halter monitor per [employer] called on Saturday with abnormal readings. Cardiologist contacted on Sunday and told him to come to ED. He did not until he checked pulse with finger prob tonight and never dropped below 163. Pt is reporting bad headaches, diaphoresis, lightheadedness, tingling and numbness in all extremities and face, chest pain, SOB. Took metoprolol 25mg  before he came. Was on flecainide but stopped that bc in HR in 30s. Stopped flecainide a week ago.   RN had pt try valsalva maneuvers in triage

## 2022-11-20 NOTE — ED Provider Notes (Addendum)
[place] EMERGENCY DEPARTMENT AT [facility] Provider Note   CSN: [id number] Arrival date & time: 11/20/22  2233     History  Chief Complaint  Patient presents with   Chest Pain    Michael Choi is a 62 y.o. male.  The history is provided by the patient and medical records.  Chest Pain Michael Choi is a 62 y.o. male who presents to the Emergency Department complaining of chest pain and elevated heart rate.  He presents to the emergency department upon urgency from his cardiologist for evaluation of elevated heart rate on his ZIO monitor at home.  He has been feeling poorly intermittently since Saturday when he was working in the yard throughout the day.  He states he has been experiencing intermittent tachycardia, chest pain that is described as sharp in nature as well as intermittent headache with episodes of diaphoresis.  He does experience shortness of breath when he has these episodes.  He also reports intermittent tingling in bilateral arms, legs as well as some left facial tingling.  He has a history of persistent atrial fibrillation and is compliant with his home medications.  His flecainide was discontinued 1 week ago due to episodes of bradycardia and syncope.  He is taking his Eliquis as well as his metoprolol.     Home Medications Prior to Admission medications   Medication Sig Start Date End Date Taking? Authorizing Provider  apixaban (ELIQUIS) 5 MG TABS tablet Take 1 tablet (5 mg total) by mouth 2 (two) times daily. 09/22/22  Yes Fenton, Clint R, PA  metoprolol tartrate (LOPRESSOR) 25 MG tablet Take 1 tablet (25 mg total) by mouth 2 (two) times daily. 11/17/22  Yes Duke Salvia, MD  Multiple Vitamins-Minerals (ONE DAILY CALCIUM/IRON) TABS Take 1 tablet by mouth daily. 07/21/18  Yes [provider]  Omega-3 Fatty Acids (FISH OIL) 1000 MG CAPS Take 1 capsule by mouth daily. 12/05/19  Yes [provider]  omeprazole (PRILOSEC) 40 MG capsule TAKE 1  CAPSULE (40 MG TOTAL) BY MOUTH DAILY. PRILOSEC 40 MG DAILY FOR 8 WEEKS. Patient taking differently: Take 40 mg by mouth daily as needed (acid reflux). 11/11/22  Yes Jenel Lucks, MD  testosterone cypionate (DEPOTESTOSTERONE CYPIONATE) 200 MG/ML injection Inject 150 mg into the muscle once a week. 01/07/22  Yes [provider]      Allergies    Doxycycline    Review of Systems   Review of Systems  Cardiovascular:  Positive for chest pain.  All other systems reviewed and are negative.   Physical Exam Updated Vital Signs BP (!) 144/112   Pulse 90   Temp (!) 97.4 F (36.3 C) (Oral)   Resp 19   SpO2 95%  Physical Exam Vitals and nursing note reviewed.  Constitutional:      Appearance: He is well-developed.  HENT:     Head: Normocephalic and atraumatic.  Cardiovascular:     Rate and Rhythm: Tachycardia present. Rhythm irregular.     Heart sounds: No murmur heard. Pulmonary:     Effort: Pulmonary effort is normal. No respiratory distress.     Breath sounds: Normal breath sounds.  Abdominal:     Palpations: Abdomen is soft.     Tenderness: There is no abdominal tenderness. There is no guarding or rebound.  Musculoskeletal:        General: No swelling or tenderness.  Skin:    General: Skin is warm and dry.  Neurological:  Mental Status: He is alert and oriented to person, place, and time.     Comments: No asymmetry of facial movements.  Visual fields grossly intact.  5 out of 5 strength in all 4 extremities with sensation to light touch intact in all 4 extremities.  No pronator drift.  Psychiatric:        Behavior: Behavior normal.     ED Results / Procedures / Treatments   Labs (all labs ordered are listed, but only abnormal results are displayed) Labs Reviewed  COMPREHENSIVE METABOLIC PANEL - Abnormal; Notable for the following components:      Result Value   Glucose, Bld 161 (*)    Creatinine, Ser 1.34 (*)    Calcium 8.8 (*)    AST 45 (*)     Alkaline Phosphatase 37 (*)    GFR, Estimated 60 (*)    All other components within normal limits  CBC WITH DIFFERENTIAL/PLATELET - Abnormal; Notable for the following components:   Hemoglobin 18.0 (*)    HCT 53.8 (*)    All other components within normal limits  TROPONIN I (HIGH SENSITIVITY) - Abnormal; Notable for the following components:   Troponin I (High Sensitivity) 50 (*)    All other components within normal limits  MAGNESIUM    EKG EKG Interpretation  Date/Time:  Thursday Nov 20 2022 22:42:08 EDT Ventricular Rate:  161 PR Interval:  98 QRS Duration: 80 QT Interval:  272 QTC Calculation: 445 R Axis:   83 Text Interpretation: Atrial flutter Otherwise normal ECG Confirmed by Tilden Fossa [phone]) on 11/20/2022 11:25:42 PM  Radiology CT Head Wo Contrast  Result Date: 11/21/2022 CLINICAL DATA:  Headaches lightheaded numbness EXAM: CT HEAD WITHOUT CONTRAST TECHNIQUE: Contiguous axial images were obtained from the base of the skull through the vertex without intravenous contrast. RADIATION DOSE REDUCTION: This exam was performed according to the departmental dose-optimization program which includes automated exposure control, adjustment of the mA and/or kV according to patient size and/or use of iterative reconstruction technique. COMPARISON:  CT brain 10/05/2019, MRI brain 10/05/2019 FINDINGS: Brain: No acute territorial infarction, hemorrhage or intracranial mass. Patchy white matter hypodensity. Nonenlarged ventricles Vascular: No hyperdense vessels. Mild carotid vascular calcification Skull: Normal. Negative for fracture or focal lesion. Sinuses/Orbits: No acute finding. Other: None IMPRESSION: 1. No CT evidence for acute intracranial abnormality. 2. Patchy white matter hypodensity, likely due to chronic small vessel ischemic change. Electronically Signed   By: Jasmine Pang M.D.   On: 11/21/2022 00:55   DG Chest 2 View  Result Date: 11/20/2022 CLINICAL DATA:  Chest pain EXAM:  CHEST - 2 VIEW COMPARISON:  Chest x-ray 04/30/2022 FINDINGS: The heart size and mediastinal contours are within normal limits. Both lungs are clear. The visualized skeletal structures are unremarkable. IMPRESSION: No active cardiopulmonary disease. Electronically Signed   By: Darliss Cheney M.D.   On: 11/20/2022 23:38    Procedures Procedures    Medications Ordered in ED Medications  fentaNYL (SUBLIMAZE) injection 50 mcg (50 mcg Intravenous Given 11/20/22 2335)  metoprolol tartrate (LOPRESSOR) injection 2.5 mg (2.5 mg Intravenous Given 11/20/22 2336)    ED Course/ Medical Decision Making/ A&P                             Medical Decision Making Amount and/or Complexity of Data Reviewed Labs: ordered. Radiology: ordered.  Risk Prescription drug management.   Patient with history of persistent atrial fibrillation here for evaluation of chest  pain, headaches, diaphoresis that been waxing waning since Saturday.  He is in atrial flutter or on evaluation in the ED.  Initially he was in rapid flutter with rate of 160.  He did take a dose of metoprolol prior to ED arrival and was treated with a one-time dose of metoprolol 2.5 mg with a rapid improvement in his heart rate to 70s to 80s.  He does remain in flutter.  On repeat assessment after rate control he is feeling significantly improved.  He does have 1 mildly elevated troponin of 50.  EKG does not have any acute ischemic changes.  Given patient's reported headache as well as tingling all over and his anticoagulation a CT head was obtained, which is negative for acute abnormality.  Discussed patient's presentation, labs and EKG with on-call cardiologist Dr. Okey Dupre.  Given patient is currently rate controlled plan to discharge home with increased dose of his metoprolol.  No indication for repeat troponin at this time per cardiology.  Discussed with patient recommendations and he is in agreement with plan.  He has been taking 12.5 mg of metoprolol twice  daily and this will be return to his original 25 mg twice daily dosing.  Current clinical picture is not consistent with CVA, ACS, dissection, PE.     CHA2DS2/VAS Stroke Risk Points  Current as of about an hour ago     1 >= 2 Points: High Risk  1 - 1.99 Points: Medium Risk  0 Points: Low Risk    Last Change: N/A      Details    This score determines the patient's risk of having a stroke if the  patient has atrial fibrillation.       Points Metrics  0 Has Congestive Heart Failure:  No    Current as of about an hour ago  0 Has Vascular Disease:  No    Current as of about an hour ago  1 Has Hypertension:  Yes    Current as of about an hour ago  0 Age:  28    Current as of about an hour ago  0 Has Diabetes:  No    Current as of about an hour ago  0 Had Stroke:  No  Had TIA:  No  Had Thromboembolism:  No    Current as of about an hour ago  0 Male:  No    Current as of about an hour ago             Final Clinical Impression(s) / ED Diagnoses Final diagnoses:  Atrial flutter, unspecified type [facility])    Rx / DC Orders ED Discharge Orders     None         Tilden Fossa, MD 11/21/22 0541    Tilden Fossa, MD 11/21/22 [phone]

## 2022-11-21 ENCOUNTER — Telehealth: Payer: Self-pay | Admitting: Internal Medicine

## 2022-11-21 ENCOUNTER — Emergency Department (HOSPITAL_COMMUNITY): Payer: 59

## 2022-11-21 NOTE — Telephone Encounter (Signed)
Spoke with pt who states he was seen in the ED last night and states he sees that he has an elevated troponin. Pt is concerned as he states he has never had an elevation in this lab before.  Reviewed ED note as below with pt and explained there other reasons for elevated troponin one also being his afib/flutter.  Pt reports he is feeling better but still has some headache.  Pt advised to continue to rest today and may try some tylenol for continued headache but to take according to directions.  Pt states he is currently not wearing Zio monitor as he removed due to sweating.  Pt advised will confirm with monitor tech he can reapply monitor since he has had it off for a period of time.  Pt has a follow up appointment scheduled with Dr Graciela Husbands on 11/28/2022.  Pt verbalizes understanding and agrees with current plan.  Patient with history of persistent atrial fibrillation here for evaluation of chest pain, headaches, diaphoresis that been waxing waning since Saturday.  He is in atrial flutter or on evaluation in the ED.  Initially he was in rapid flutter with rate of 160.  He did take a dose of metoprolol prior to ED arrival and was treated with a one-time dose of metoprolol 2.5 mg with a rapid improvement in his heart rate to 70s to 80s.  He does remain in flutter.  On repeat assessment after rate control he is feeling significantly improved.  He does have 1 mildly elevated troponin of 50.  EKG does not have any acute ischemic changes.  Given patient's reported headache as well as tingling all over and his anticoagulation a CT head was obtained, which is negative for acute abnormality.  Discussed patient's presentation, labs and EKG with on-call cardiologist Dr. Okey Dupre.  Given patient is currently rate controlled plan to discharge home with increased dose of his metoprolol.  No indication for repeat troponin at this time per cardiology.   Discussed with patient recommendations and he is in agreement with plan.  He has  been taking 12.5 mg of metoprolol twice daily and this will be return to his original 25 mg twice daily dosing.   Current clinical picture is not consistent with CVA, ACS, dissection, PE.

## 2022-11-21 NOTE — Telephone Encounter (Signed)
   Pt requesting to speak with Dr. Odessa Fleming nurse. He said, its about his result and would like to discuss it with them

## 2022-11-25 DIAGNOSIS — I471 Supraventricular tachycardia, unspecified: Secondary | ICD-10-CM | POA: Insufficient documentation

## 2022-11-28 ENCOUNTER — Encounter: Payer: Self-pay | Admitting: Internal Medicine

## 2022-11-28 ENCOUNTER — Ambulatory Visit: Payer: 59 | Attending: Internal Medicine | Admitting: Internal Medicine

## 2022-11-28 VITALS — BP 120/84 | HR 55 | Ht 71.0 in | Wt 253.2 lb

## 2022-11-28 DIAGNOSIS — I471 Supraventricular tachycardia, unspecified: Secondary | ICD-10-CM | POA: Diagnosis not present

## 2022-11-28 DIAGNOSIS — I4819 Other persistent atrial fibrillation: Secondary | ICD-10-CM | POA: Diagnosis not present

## 2022-11-28 DIAGNOSIS — I4892 Unspecified atrial flutter: Secondary | ICD-10-CM | POA: Diagnosis not present

## 2022-11-28 MED ORDER — FLECAINIDE ACETATE 50 MG PO TABS: 50.0000 mg | ORAL_TABLET | Freq: Two times a day (BID) | ORAL | 3 refills | Status: DC

## 2022-11-28 MED ORDER — LOSARTAN POTASSIUM 25 MG PO TABS: 25.0000 mg | ORAL_TABLET | Freq: Every day | ORAL | 3 refills | Status: DC

## 2022-11-28 NOTE — Patient Instructions (Signed)
Medication Instructions:   Your physician has recommended you make the following change in your medication:   ** Begin Losartan 25mg  - 1 tablet by mouth daily  ** Begin Flecainide 50mg  - 1 tablet by mouth twice daily  *If you need a refill on your cardiac medications before your next appointment, please call your pharmacy*   Lab Work: None ordered.  If you have labs (blood work) drawn today and your tests are completely normal, you will receive your results only by: MyChart Message (if you have MyChart) OR A paper copy in the mail If you have any lab test that is abnormal or we need to change your treatment, we will call you to review the results.   Testing/Procedures: None ordered.    Follow-Up: At [facility], you and your health needs are our priority.  As part of our continuing mission to provide you with exceptional heart care, we have created designated Provider Care Teams.  These Care Teams include your primary Cardiologist (physician) and Advanced Practice Providers (APPs -  Physician Assistants and Nurse Practitioners) who all work together to provide you with the care you need, when you need it.  We recommend signing up for the patient portal called "MyChart".  Sign up information is provided on this After Visit Summary.  MyChart is used to connect with patients for Virtual Visits (Telemedicine).  Patients are able to view lab/test results, encounter notes, upcoming appointments, etc.  Non-urgent messages can be sent to your provider as well.   To learn more about what you can do with MyChart, go to [email or web address].    Your next appointment:   01/07/2023 with Dr Lalla Brothers in our [facility] office  Other Instructions needs office visit with Dr Mayford Knife

## 2022-11-28 NOTE — Progress Notes (Signed)
[facility]     Patient Care Team: Carolynn Serve, MD as PCP - General (Family Medicine) Hillis Range, MD (Inactive) as PCP - Electrophysiology (Cardiology) Duke Salvia, MD as Consulting Physician (Clinical Cardiac Electrophysiology)   HPI  Michael Choi is a 62 y.o. male Seen in followup for atrial flutter for which he underwent ablation 8/19 and atrial fibrillation for which he underwent catheter ablation with Dr. Fawn Kirk 2/22.  Nonischemic cardiomyopathy modest  OSA- untreated has not been able to tolerate titration.    No chest pain but exercise intolerance.  Some dyspnea.  Notes his heart rate goes up to 110 or so with exercise.  Sometimes his heart rate goes down.  (See below)  In the ER 4/24 with recurrent atrial flutter-atypical heart rate 160  2: 1 conduction  He stopped taking his flecainide.  There is a discrepancy in his story and the medical record.  He says that he stopped after 2 episodes of syncope that began the second day after he took it.  Reviewing the notes, he was actually reported been on it a month without difficulty and then a year later also without difficulty.  DATE TEST EF   5/19 Echo   45-50 %   5/19 TEE  30-35 %   8/20 TEE 45-50%   8/23 Echo  45.50%    Thromboembolic risk factors (, HTN-1, CHF-1) for a CHADSVASc Score of 2  Date Cr K Hgb  8/19 1.29 5.0 17.9   5/24 1.34 4.2 18     Records and Results Reviewed   Past Medical History:  Diagnosis Date   Atrial flutter [facility])    s/p CTI ablation by Dr Graciela Husbands   Atypical nevus 04/16/2016   left outer back (mild)    Hypertension    Melanoma (HCC)    stage I,  resected.  did not require chemotherapy or radiation   Persistent atrial fibrillation [facility])     Past Surgical History:  Procedure Laterality Date   A-FLUTTER ABLATION N/A 03/05/2018   Procedure: A-FLUTTER ABLATION;  Surgeon: Duke Salvia, MD;  Location: [facility] INVASIVE CV LAB;  Service: Cardiovascular;  Laterality: N/A;   ATRIAL  FIBRILLATION ABLATION N/A 08/23/2020   Procedure: ATRIAL FIBRILLATION ABLATION;  Surgeon: Hillis Range, MD;  Location: [facility] INVASIVE CV LAB;  Service: Cardiovascular;  Laterality: N/A;   CARDIOVERSION N/A 11/27/2017   Procedure: CARDIOVERSION;  Surgeon: Elder Negus, MD;  Location: [facility] ENDOSCOPY;  Service: Cardiovascular;  Laterality: N/A;   CARDIOVERSION N/A 03/18/2019   Procedure: CARDIOVERSION;  Surgeon: Lewayne Bunting, MD;  Location: [facility] ENDOSCOPY;  Service: Cardiovascular;  Laterality: N/A;   CARDIOVERSION N/A 07/18/2020   Procedure: CARDIOVERSION;  Surgeon: Jake Bathe, MD;  Location: [facility] ENDOSCOPY;  Service: Cardiovascular;  Laterality: N/A;   CARDIOVERSION N/A 01/18/2021   Procedure: CARDIOVERSION;  Surgeon: Sande Rives, MD;  Location: [facility] ENDOSCOPY;  Service: Cardiovascular;  Laterality: N/A;   KNEE ARTHROSCOPY     TEE WITHOUT CARDIOVERSION N/A 11/27/2017   Procedure: TRANSESOPHAGEAL ECHOCARDIOGRAM (TEE);  Surgeon: Elder Negus, MD;  Location: [facility] ENDOSCOPY;  Service: Cardiovascular;  Laterality: N/A;   TEE WITHOUT CARDIOVERSION N/A 03/18/2019   Procedure: TRANSESOPHAGEAL ECHOCARDIOGRAM (TEE);  Surgeon: Lewayne Bunting, MD;  Location: [facility] ENDOSCOPY;  Service: Cardiovascular;  Laterality: N/A;    Current Meds  Medication Sig   apixaban (ELIQUIS) 5 MG TABS tablet Take 1 tablet (5 mg total) by mouth 2 (two) times daily.   metoprolol tartrate (LOPRESSOR)  25 MG tablet Take 1 tablet (25 mg total) by mouth 2 (two) times daily.   Multiple Vitamin (MULTIVITAMIN ADULT PO) Take by mouth. 1 daily   Multiple Vitamins-Minerals (ONE DAILY CALCIUM/IRON) TABS Take 1 tablet by mouth daily.   Omega-3 Fatty Acids (FISH OIL) 1000 MG CAPS Take 1 capsule by mouth daily.   omeprazole (PRILOSEC) 40 MG capsule TAKE 1 CAPSULE (40 MG TOTAL) BY MOUTH DAILY. PRILOSEC 40 MG DAILY FOR 8 WEEKS. (Patient taking differently: Take 40 mg by mouth daily as needed (acid reflux).)   testosterone  cypionate (DEPOTESTOSTERONE CYPIONATE) 200 MG/ML injection Inject 75 mg into the muscle once a week.    Allergies  Allergen Reactions   Doxycycline Hives, Swelling and Other (See Comments)    "I felt a little swelling in my throat," so he ceased taking it      Review of Systems negative except from HPI and PMH  Physical Exam BP 120/84   Pulse (!) 55   Ht 5\' 11"  (1.803 m)   Wt 253 lb 3.2 oz (114.9 kg)   SpO2 93%   BMI 35.31 kg/m  Well developed and nourished in no acute distress HENT normal Neck supple with JVP-  flat  Clear Irregular rate and rhythm, no murmurs or gallops Abd-soft with active BS No Clubbing cyanosis edema Skin-warm and dry A & Oriented  Grossly normal sensory and motor function  ECG sinus at 55 Interval 13/09/39 Atrial triplets   Assessment and  Plan Atrial Flutter / fibrillation/CTI ablation; PVI ablation now recurrent  Nonischemic cardiomyopathy  Hypertension  Polycythemia  Dyspnea on exertion/congestive heart failure class IIb    Recurrent atrial flutter in the context of having stopped his flecainide.  His story of stopping his flecainide as outlined above, we will resume flecainide at 50 twice daily.  Will start this in 2 weeks, following a 2-week hold on his metoprolol to see if we can implicate it in some of his fatigue.  He will let us know.  In the interim his blood pressure is well-controlled but he needs GDMT for his midrange ejection fraction issues so we will put him on losartan.  Also think that he would benefit from spironolactone.  I have reached out to Dr. Florina Ou to see whether she can help him follow-up on his sleep study results and get him properly treated for his apnea.

## 2022-12-23 ENCOUNTER — Telehealth: Payer: Self-pay | Admitting: Internal Medicine

## 2022-12-23 NOTE — Telephone Encounter (Signed)
Called patient back about his message. Patient stated his HR is dropping as low as 43 and fluctuating between 40's and 70's. Patient stated he is about to tell if he is in A. FIB and he states he is.  Patient stated he does have a little lightheadedness and feeling tired. Patient stated he has been having several of these episodes since starting flecainide 50 mg BID in May. Patient stated if he was at home, he would just lay down and sleep and it would be better when he gets up. Patient stated he was started on flecainide due to having elevated heart rates. Patient stated he is not taking metoprolol and wonders if he should just take flecainide in the evening. Patient does not feel like he needs to go to ED or see anyone. Patient thinks he just needs his medications adjusted.  Encourage patient to hold flecainide for now, will consult DOD.

## 2022-12-23 NOTE — Telephone Encounter (Signed)
Returned call to patient and discussed at length Andy's comments below. Patient states he will start back on his Flecainide twice daily and will resume Metoprolol. Advised that HR dipping into 40's is not concerning as long as it goes right back up, which he states it does. He also mentions he has a sleep apnea consult next month and his A-fib is routinely worse in the AM hours. He feels better at time of call now, than this morning. Declined a-fib clinic appt at this time. He will keep current follow up and call us if needed before.  Graciella Freer, PA-C  Ethelda Chick, RN; Duke Salvia, MD; Alois Cliche, RN; Lanier Prude, MD1 hour ago (3:19 PM)   It looks like he was supposed to: Hold metoprolol for 2 weeks, Then start back on metoprolol AND flecainide.    Would not have him on flecainide alone given history of fib and flutter (s/p CTI).  He will either need to take the flecainide BID AND metoprolol, or the metoprolol alone.   Flecainide once daily is unlikely to be therapeutic.  He is pending follow up in a couple of weeks to see Dr. Lalla Brothers to consider ablation of his fib.  Would be reasonable to have him seen in AF clinic this week or early next to clarify symptoms and how he is taking his medications. Bradycardia on his monitor was primarily nocturnal, so his measured bradycardia during the day (especially if asymptomatic) may be due to ectopy.

## 2022-12-23 NOTE — Telephone Encounter (Signed)
Attempted phone call to pt.  No voicemail to leave message.  Will attempt to callback at a later time.

## 2022-12-23 NOTE — Telephone Encounter (Signed)
STAT if HR is under 50 or over 120 (normal HR is 60-100 beats per minute)  What is your heart rate?  Patient stated fluctuating between 43 and 76 Oxygen 95-96  Do you have a log of your heart rate readings (document readings)? No  Do you have any other symptoms? A little bit lightheaded, SOB, sleepy  Patient is concerned about his fluctuating heart rate.

## 2022-12-23 NOTE — Telephone Encounter (Signed)
Called patient with recommendations. Patient has just finish wearing a monitor. Patient will only take flecainide 50 mg in the evening for now, until he hears back from Dr. Graciela Husbands. Will also send message to Otilio Saber PA who ordered monitor.

## 2022-12-23 NOTE — Telephone Encounter (Signed)
Dr. Graciela Husbands outlined the patient's history with flecainide.  This was recently restarted.  I would try to get more data with a 2 week Zio patch for Dr. Graciela Husbands to evaluate.  Can drop Flecainide to 50 mg qPM for now and monitor HR.  Will also route to Dr. Graciela Husbands and see if he would go back to flecainide BID at some point

## 2023-01-02 ENCOUNTER — Telehealth: Payer: Self-pay | Admitting: Internal Medicine

## 2023-01-02 NOTE — Telephone Encounter (Signed)
*  STAT* If patient is at the pharmacy, call can be transferred to refill team.   1. Which medications need to be refilled? (please list name of each medication and dose if known) losartan (COZAAR) 25 MG tablet   2. Which pharmacy/location (including street and city if local pharmacy) is medication to be sent to?  [id number] [place], [place] - [address]    3. Do they need a 30 day or 90 day supply? 90

## 2023-01-02 NOTE — Telephone Encounter (Signed)
The patient was notified that a prescription for a year of refills was sent in on 11/28/22.  The patient stated that his pharmacy said he was requesting the refill for losartan too soon and that he would look at home for the losartan.

## 2023-01-07 ENCOUNTER — Ambulatory Visit: Payer: 59 | Attending: Cardiology | Admitting: Cardiology

## 2023-01-07 ENCOUNTER — Encounter: Payer: Self-pay | Admitting: Cardiology

## 2023-01-07 VITALS — BP 130/70 | HR 82 | Ht 72.0 in | Wt 247.5 lb

## 2023-01-07 DIAGNOSIS — I471 Supraventricular tachycardia, unspecified: Secondary | ICD-10-CM | POA: Diagnosis not present

## 2023-01-07 DIAGNOSIS — I4819 Other persistent atrial fibrillation: Secondary | ICD-10-CM | POA: Diagnosis not present

## 2023-01-07 DIAGNOSIS — I5022 Chronic systolic (congestive) heart failure: Secondary | ICD-10-CM

## 2023-01-07 DIAGNOSIS — I4892 Unspecified atrial flutter: Secondary | ICD-10-CM | POA: Diagnosis not present

## 2023-01-07 DIAGNOSIS — Z79899 Other long term (current) drug therapy: Secondary | ICD-10-CM

## 2023-01-07 NOTE — Patient Instructions (Addendum)
Medication Instructions:  Your physician recommends that you continue on your current medications as directed. Please refer to the Current Medication list given to you today.  *If you need a refill on your cardiac medications before your next appointment, please call your pharmacy*  Lab Work: BMET and CBC prior to CT scan and ablation  Testing/Procedures: Your physician has requested that you have cardiac CT. Cardiac computed tomography (CT) is a painless test that uses an x-ray machine to take clear, detailed pictures of your heart. We will call you to schedule your CT scan. This will be done about one week prior to your ablation.   Your physician has recommended that you have an ablation. Catheter ablation is a medical procedure used to treat some cardiac arrhythmias (irregular heartbeats). During catheter ablation, a long, thin, flexible tube is put into a blood vessel in your groin (upper thigh), or neck. This tube is called an ablation catheter. It is then guided to your heart through the blood vessel. Radio frequency waves destroy small areas of heart tissue where abnormal heartbeats may cause an arrhythmia to start. You are scheduled for Atrial Fibrillation Ablation on Monday, October 28 with Dr. Steffanie Dunn.Please arrive at the Main Entrance A at [facility]: [address] [place], [place] [address] at 8:30 AM   Follow-Up: At [facility], you and your health needs are our priority.  As part of our continuing mission to provide you with exceptional heart care, we have created designated Provider Care Teams.  These Care Teams include your primary Cardiologist (physician) and Advanced Practice Providers (APPs -  Physician Assistants and Nurse Practitioners) who all work together to provide you with the care you need, when you need it.  Your next appointment:   We will call you to schedule your follow up appointments.

## 2023-01-07 NOTE — Progress Notes (Signed)
Electrophysiology Office Follow up Visit Note:    Date:  01/07/2023   ID:  Michael Choi, DOB [birth year], MRN [id number]  PCP:  Patient, No Pcp Per  [facility] Cardiologist:  None  [facility] Electrophysiologist:  Lanier Prude, MD    Interval History:    Michael Choi is a 62 y.o. male who presents for a follow up visit.   Last seen Nov 28, 2022 by Dr. Graciela Husbands.  He has a history of atrial flutter post ablation in August 2019.  He also has a history of atrial fibrillation and was ablated by Dr. Johney Frame in February 2022.  He has chronic systolic heart failure secondary to a nonischemic cardiomyopathy.  He has untreated sleep apnea.  He has been on flecainide in the past.  During his February 2022 ablation by Dr. Johney Frame, the pulmonary veins and posterior wall were isolated.  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  His episodes of arrhythmia have become much more frequent.  He is experiencing daily episodes of atrial fibrillation that are symptomatic.    Past medical, surgical, social and family history were reviewed.  ROS:   Please see the history of present illness.    All other systems reviewed and are negative.  EKGs/Labs/Other Studies Reviewed:    The following studies were reviewed today:  Dec 17, 2022 ZIO monitor personally reviewed 2% A-fib/flutter burden, average ventricular rate 161 bpm Frequent bursts of SVT and atrial ectopy  EKG:  The ekg ordered today demonstrates sinus rhythm with PACs.  PR is 160 ms.  QRS duration is 94 ms.   Physical Exam:    VS:  BP 130/70 (BP Location: Left Arm, Patient Position: Sitting, Cuff Size: Large)   Pulse 82   Ht 6' (1.829 m)   Wt 247 lb 8 oz (112.3 kg)   SpO2 97%   BMI 33.57 kg/m     Wt Readings from Last 3 Encounters:  01/07/23 247 lb 8 oz (112.3 kg)  11/28/22 253 lb 3.2 oz (114.9 kg)  11/05/22 249 lb 12.8 oz (113.3 kg)     GEN:  Well nourished, well developed in no acute  distress CARDIAC: Irregular rhythm, no murmurs, rubs, gallops RESPIRATORY:  Clear to auscultation without rales, wheezing or rhonchi       ASSESSMENT:    1. Atrial flutter, unspecified type (HCC)   2. Persistent atrial fibrillation (HCC)   3. SVT (supraventricular tachycardia)   4. Chronic systolic heart failure (HCC)   5. Encounter for long-term (current) use of high-risk medication    PLAN:    In order of problems listed above:   #Persistent atrial fibrillation and flutter #High risk drug monitoring-flecainide Confirmed by ZIO monitor.  I suspect he has had a reconnection of one of the pulmonary vein sets from prior ablation.  The amount of ectopy could very well be a PV A. tach.  I discussed treatment options with the patient including antiarrhythmic drug therapy other than flecainide versus repeat catheter ablation.  Given his young age, favor catheter ablation to avoid long-term exposure to the antiarrhythmic drugs.  He is in agreement.  Discussed treatment options today for AF including antiarrhythmic drug therapy and ablation. Discussed risks, recovery and likelihood of success with each treatment strategy. Risk, benefits, and alternatives to EP study and radiofrequency ablation for afib were discussed. These risks include but are not limited to stroke, bleeding, vascular damage, tamponade, perforation, damage to the esophagus, lungs, phrenic  nerve and other structures, pulmonary vein stenosis, worsening renal function, and death.  Discussed potential need for repeat ablation procedures and antiarrhythmic drugs after an initial ablation. The patient understands these risk and wishes to proceed.  We will therefore proceed with catheter ablation at the next available time.  Carto, ICE, anesthesia are requested for the procedure.  Will also obtain CT PV protocol prior to the procedure to exclude LAA thrombus and further evaluate atrial anatomy.  PR and QRS duration acceptable for  continued flecainide use.  Plan to stop flecainide 90 days after catheter ablation as long as he has had improvement in rhythm.  #Chronic systolic heart failure NYHA class II.  Last EF 45 to 50% in August 2023.  Warm and dry on exam today. Rhythm control indicated.       Signed, Steffanie Dunn, MD, [facility], [facility] 01/07/2023 3:14 PM    Electrophysiology [facility]

## 2023-02-04 ENCOUNTER — Other Ambulatory Visit: Payer: Self-pay

## 2023-02-04 MED ORDER — METOPROLOL TARTRATE 25 MG PO TABS: 25.0000 mg | ORAL_TABLET | Freq: Two times a day (BID) | ORAL | 3 refills | Status: DC

## 2023-02-16 ENCOUNTER — Other Ambulatory Visit: Payer: Self-pay

## 2023-02-16 ENCOUNTER — Emergency Department (HOSPITAL_COMMUNITY)
Admission: EM | Admit: 2023-02-16 | Discharge: 2023-02-16 | Disposition: A | Discharge status: Home / Self Care | Payer: 59 | Attending: Emergency Medicine | Admitting: Emergency Medicine

## 2023-02-16 ENCOUNTER — Encounter: Payer: Self-pay | Admitting: Cardiology

## 2023-02-16 ENCOUNTER — Emergency Department (HOSPITAL_COMMUNITY): Payer: 59

## 2023-02-16 ENCOUNTER — Ambulatory Visit: Payer: 59 | Attending: Cardiology | Admitting: Cardiology

## 2023-02-16 ENCOUNTER — Encounter (HOSPITAL_COMMUNITY): Payer: Self-pay

## 2023-02-16 VITALS — BP 150/96 | HR 137 | Ht 72.0 in | Wt 247.0 lb

## 2023-02-16 DIAGNOSIS — I471 Supraventricular tachycardia, unspecified: Secondary | ICD-10-CM

## 2023-02-16 DIAGNOSIS — R0602 Shortness of breath: Secondary | ICD-10-CM | POA: Insufficient documentation

## 2023-02-16 DIAGNOSIS — I4892 Unspecified atrial flutter: Secondary | ICD-10-CM

## 2023-02-16 DIAGNOSIS — R000 Tachycardia, unspecified: Secondary | ICD-10-CM

## 2023-02-16 DIAGNOSIS — I4891 Unspecified atrial fibrillation: Secondary | ICD-10-CM | POA: Insufficient documentation

## 2023-02-16 DIAGNOSIS — I200 Unstable angina: Secondary | ICD-10-CM | POA: Diagnosis not present

## 2023-02-16 DIAGNOSIS — I484 Atypical atrial flutter: Secondary | ICD-10-CM

## 2023-02-16 DIAGNOSIS — I4819 Other persistent atrial fibrillation: Secondary | ICD-10-CM

## 2023-02-16 DIAGNOSIS — I10 Essential (primary) hypertension: Secondary | ICD-10-CM

## 2023-02-16 DIAGNOSIS — G4733 Obstructive sleep apnea (adult) (pediatric): Secondary | ICD-10-CM

## 2023-02-16 DIAGNOSIS — Z7901 Long term (current) use of anticoagulants: Secondary | ICD-10-CM | POA: Insufficient documentation

## 2023-02-16 LAB — CBC WITH DIFFERENTIAL/PLATELET
Abs Immature Granulocytes: 0.02 10*3/uL (ref 0.00–0.07)
Basophils Absolute: 0 10*3/uL (ref 0.0–0.1)
Basophils Relative: 0 %
Eosinophils Absolute: 0.2 10*3/uL (ref 0.0–0.5)
Eosinophils Relative: 2 %
HCT: 49.7 % (ref 39.0–52.0)
Hemoglobin: 16.8 g/dL (ref 13.0–17.0)
Immature Granulocytes: 0 %
Lymphocytes Relative: 18 %
Lymphs Abs: 1.6 10*3/uL (ref 0.7–4.0)
MCH: 31.9 pg (ref 26.0–34.0)
MCHC: 33.8 g/dL (ref 30.0–36.0)
MCV: 94.3 fL (ref 80.0–100.0)
Monocytes Absolute: 1.2 10*3/uL — ABNORMAL HIGH (ref 0.1–1.0)
Monocytes Relative: 14 %
Neutro Abs: 5.6 10*3/uL (ref 1.7–7.7)
Neutrophils Relative %: 66 %
Platelets: 208 10*3/uL (ref 150–400)
RBC: 5.27 MIL/uL (ref 4.22–5.81)
RDW: 14.6 % (ref 11.5–15.5)
WBC: 8.6 10*3/uL (ref 4.0–10.5)
nRBC: 0 % (ref 0.0–0.2)

## 2023-02-16 LAB — COMPREHENSIVE METABOLIC PANEL
ALT: 38 U/L (ref 0–44)
AST: 21 U/L (ref 15–41)
Albumin: 3.1 g/dL — ABNORMAL LOW (ref 3.5–5.0)
Alkaline Phosphatase: 47 U/L (ref 38–126)
Anion gap: 8 (ref 5–15)
BUN: 13 mg/dL (ref 8–23)
CO2: 25 mmol/L (ref 22–32)
Calcium: 8.7 mg/dL — ABNORMAL LOW (ref 8.9–10.3)
Chloride: 102 mmol/L (ref 98–111)
Creatinine, Ser: 1.17 mg/dL (ref 0.61–1.24)
GFR, Estimated: >60 mL/min (ref >60)
Glucose, Bld: 83 mg/dL (ref 70–99)
Potassium: 4.1 mmol/L (ref 3.5–5.1)
Sodium: 135 mmol/L (ref 135–145)
Total Bilirubin: 1.2 mg/dL (ref 0.3–1.2)
Total Protein: 6.3 g/dL — ABNORMAL LOW (ref 6.5–8.1)

## 2023-02-16 LAB — BRAIN NATRIURETIC PEPTIDE: B Natriuretic Peptide: 47.1 pg/mL (ref 0.0–100.0)

## 2023-02-16 LAB — TSH: TSH: 0.694 u[IU]/mL (ref 0.350–4.500)

## 2023-02-16 LAB — TROPONIN I (HIGH SENSITIVITY): Troponin I (High Sensitivity): 20 ng/L — ABNORMAL HIGH (ref <18)

## 2023-02-16 LAB — MAGNESIUM: Magnesium: 2 mg/dL (ref 1.7–2.4)

## 2023-02-16 MED ORDER — FLECAINIDE ACETATE 50 MG PO TABS
100.0000 mg | ORAL_TABLET | Freq: Once | ORAL | Status: AC
  Administered 2023-02-16: 100 mg via ORAL
  Filled 2023-02-16: qty 2

## 2023-02-16 MED ORDER — FLECAINIDE ACETATE 100 MG PO TABS: 100.0000 mg | ORAL_TABLET | Freq: Two times a day (BID) | ORAL | 6 refills | Status: DC

## 2023-02-16 MED ORDER — ETOMIDATE 2 MG/ML IV SOLN
10.0000 mg | Freq: Once | INTRAVENOUS | Status: AC
  Administered 2023-02-16: 10 mg via INTRAVENOUS

## 2023-02-16 MED ORDER — METOPROLOL TARTRATE 5 MG/5ML IV SOLN: 5.0000 mg | Freq: Once | INTRAVENOUS | Status: DC

## 2023-02-16 MED ORDER — FLECAINIDE ACETATE 100 MG PO TABS: 100.0000 mg | ORAL_TABLET | Freq: Two times a day (BID) | ORAL | 0 refills | Status: DC

## 2023-02-16 MED ORDER — LACTATED RINGERS IV BOLUS: 1000.0000 mL | Freq: Once | INTRAVENOUS | Status: DC

## 2023-02-16 NOTE — Progress Notes (Signed)
Date:  02/16/2023   ID:  Michael Choi, DOB 1961/06/09, MRN [id number] The patient was identified using 2 identifiers.  PCP:  Patient, No Pcp Per  Cardiologist:  None    Referring MD: Duke Salvia, MD   Chief Complaint  Patient presents with   Follow-up    Atrial Flutter with RVR, Chest pain    History of Present Illness:    Michael Choi is a 62 y.o. male with a hx of atrial flutter s/p CTI by Dr. Graciela Husbands, HTN, melanoma and persistent atrial flutter.  He tells me that he has been having chest pain for the past few weeks that is intermittent on the left side with radiation sometimes to his left shoulder.  He currently is having CP in the office.  It can last for hours at a time and gets diaphoretic and SOB with it.  He was seen back in May in the ER with elevated Trop in setting of atrial flutter with RVR and was told that his trop bump was related to atrial arrhythmias.  He was seen again recently at [facility] ER with the same sx and was kept for a few hours and then sent home.  Currently his CP is 2/10.  Occasionally he will get SOB with the pain as well.  He had a coronary CTA in 2019  with a coronary Ca score of 0 and no CAD at that time.    Past Medical History:  Diagnosis Date   Atrial flutter [facility])    s/p CTI ablation by Dr Graciela Husbands   Atypical nevus 04/16/2016   left outer back (mild)    Hypertension    Melanoma (HCC)    stage I,  resected.  did not require chemotherapy or radiation   Persistent atrial fibrillation [facility])     Past Surgical History:  Procedure Laterality Date   A-FLUTTER ABLATION N/A 03/05/2018   Procedure: A-FLUTTER ABLATION;  Surgeon: Duke Salvia, MD;  Location: [facility] INVASIVE CV LAB;  Service: Cardiovascular;  Laterality: N/A;   ATRIAL FIBRILLATION ABLATION N/A 08/23/2020   Procedure: ATRIAL FIBRILLATION ABLATION;  Surgeon: Hillis Range, MD;  Location: [facility] INVASIVE CV LAB;  Service: Cardiovascular;  Laterality: N/A;   CARDIOVERSION N/A 11/27/2017   Procedure:  CARDIOVERSION;  Surgeon: Elder Negus, MD;  Location: [facility] ENDOSCOPY;  Service: Cardiovascular;  Laterality: N/A;   CARDIOVERSION N/A 03/18/2019   Procedure: CARDIOVERSION;  Surgeon: Lewayne Bunting, MD;  Location: [facility] ENDOSCOPY;  Service: Cardiovascular;  Laterality: N/A;   CARDIOVERSION N/A 07/18/2020   Procedure: CARDIOVERSION;  Surgeon: Jake Bathe, MD;  Location: [facility] ENDOSCOPY;  Service: Cardiovascular;  Laterality: N/A;   CARDIOVERSION N/A 01/18/2021   Procedure: CARDIOVERSION;  Surgeon: Sande Rives, MD;  Location: [facility] ENDOSCOPY;  Service: Cardiovascular;  Laterality: N/A;   KNEE ARTHROSCOPY     TEE WITHOUT CARDIOVERSION N/A 11/27/2017   Procedure: TRANSESOPHAGEAL ECHOCARDIOGRAM (TEE);  Surgeon: Elder Negus, MD;  Location: [facility] ENDOSCOPY;  Service: Cardiovascular;  Laterality: N/A;   TEE WITHOUT CARDIOVERSION N/A 03/18/2019   Procedure: TRANSESOPHAGEAL ECHOCARDIOGRAM (TEE);  Surgeon: Lewayne Bunting, MD;  Location: [facility] ENDOSCOPY;  Service: Cardiovascular;  Laterality: N/A;    Current Medications: Current Meds  Medication Sig   apixaban (ELIQUIS) 5 MG TABS tablet Take 1 tablet (5 mg total) by mouth 2 (two) times daily.   cephALEXin (KEFLEX) 500 MG capsule Take 500 mg by mouth as directed.   flecainide (TAMBOCOR) 50 MG tablet Take 1 tablet (  50 mg total) by mouth 2 (two) times daily.   losartan (COZAAR) 25 MG tablet Take 1 tablet (25 mg total) by mouth daily.   metoprolol tartrate (LOPRESSOR) 25 MG tablet Take 1 tablet (25 mg total) by mouth 2 (two) times daily.   Multiple Vitamin (MULTIVITAMIN ADULT PO) Take by mouth. 1 daily   Multiple Vitamins-Minerals (ONE DAILY CALCIUM/IRON) TABS Take 1 tablet by mouth daily.   Omega-3 Fatty Acids (FISH OIL) 1000 MG CAPS Take 1 capsule by mouth daily.   omeprazole (PRILOSEC) 40 MG capsule TAKE 1 CAPSULE (40 MG TOTAL) BY MOUTH DAILY. PRILOSEC 40 MG DAILY FOR 8 WEEKS. (Patient taking differently: Take 40 mg by mouth daily as needed  (acid reflux).)   tadalafil (CIALIS) 5 MG tablet Take 5 mg by mouth daily as needed for erectile dysfunction.   testosterone cypionate (DEPOTESTOSTERONE CYPIONATE) 200 MG/ML injection Inject 75 mg into the muscle once a week.     Allergies:   Patient has no active allergies.   Social History   Socioeconomic History   Marital status: Single    Spouse name: Not on file   Number of children: 0   Years of education: Not on file   Highest education level: Not on file  Occupational History   Occupation: court house employee  Tobacco Use   Smoking status: Never   Smokeless tobacco: Never  Vaping Use   Vaping status: Never Used  Substance and Sexual Activity   Alcohol use: Yes    Alcohol/week: 1.0 standard drink of alcohol    Types: 1 Standard drinks or equivalent per week    Comment: twice a year   Drug use: No   Sexual activity: Not on file  Other Topics Concern   Not on file  Social History Narrative   Lives in [place] with fiance   Works with department of public safety   Social Determinants of Health   Financial Resource Strain: Not on file  Food Insecurity: No Food Insecurity (05/29/2022)   Received from [facility], [facility]   Hunger Vital Sign    Worried About Running Out of Food in the Last Year: Never true    Ran Out of Food in the Last Year: Never true  Transportation Needs: Not on file  Physical Activity: Not on file  Stress: Not on file  Social Connections: Unknown (11/20/2021)   Received from [facility], [facility]   Social Network    Social Network: Not on file     Family History: The patient's family history includes Cancer in his mother; Heart disease in his father; Hypertension in his brother. There is no history of Colon cancer, Rectal cancer, or Stomach cancer.  ROS:   Please see the history of present illness.    ROS  All other systems reviewed and negative.   EKGs/Labs/Other Studies Reviewed:    EKG  Interpretation Date/Time:  Monday February 16 2023 12:50:04 EDT Ventricular Rate:  137 PR Interval:    QRS Duration:  98 QT Interval:  298 QTC Calculation: 449 R Axis:   96  Text Interpretation: Atrial flutter with variable A-V block with rapid ventricular response Rightward axis When compared with ECG of 20-Nov-2022 22:42, Atrial flutter has replaced Sinus rhythm Confirmed by Armanda Magic ([id number]) on 02/16/2023 1:15:30 PM     Physical Exam:    VS:  BP (!) 150/96 (BP Location: Left Arm, Patient Position: Sitting, Cuff Size: Large)   Pulse (!) 137   Ht 6' (1.829  m)   Wt 247 lb (112 kg)   SpO2 92%   BMI 33.50 kg/m     Wt Readings from Last 3 Encounters:  02/16/23 247 lb (112 kg)  01/07/23 247 lb 8 oz (112.3 kg)  11/28/22 253 lb 3.2 oz (114.9 kg)    GEN: Well nourished, well developed in no acute distress HEENT: Normal NECK: No JVD; No carotid bruits LYMPHATICS: No lymphadenopathy CARDIAC:RRR, no murmurs, rubs, gallops RESPIRATORY:  Clear to auscultation without rales, wheezing or rhonchi  ABDOMEN: Soft, non-tender, non-distended MUSCULOSKELETAL:  No edema; No deformity  SKIN: Warm and dry NEUROLOGIC:  Alert and oriented x 3 PSYCHIATRIC:  Normal affect   EKG Interpretation Date/Time:  Monday February 16 2023 12:50:04 EDT Ventricular Rate:  137 PR Interval:    QRS Duration:  98 QT Interval:  298 QTC Calculation: 449 R Axis:   96  Text Interpretation: Atrial flutter with variable A-V block with rapid ventricular response Rightward axis When compared with ECG of 20-Nov-2022 22:42, Atrial flutter has replaced Sinus rhythm Confirmed by Armanda Magic ([id number]) on 02/16/2023 1:15:30 PM    ASSESSMENT:    1. Unstable angina (HCC)   2. Atrial flutter, unspecified type (HCC)   3. Persistent atrial fibrillation (HCC)   4. SVT (supraventricular tachycardia)   5. OSA (obstructive sleep apnea)   6. Essential hypertension    PLAN:    In order of problems listed above:   Unstable  angina -this started a few weeks ago intermittent and sometimes radiating into the left shoulder associated with diaphoresis and some SOB in setting of atrial flutter with RVR -he had a coronary CTA with coronary Ca score 0 and no CAD in 2019 -he has been hospitalized in ER over the past few months with atrial arrhythmias and elevated trop and was told it was related to his tachycardia -he is currently having CP I the office with HR in the 130's -will send to ER for further workup and likely needs repeat ischemic workup  2.  Atrial flutter with RVR -s/p ablation in the past -now back in atrial flutter with RVR -he has been to the ER several times over the past few months and dx with atrial fib and flutter each time -he has not missed any doses of Eliquis I the past 4 weeks -continue Eliquis 5mg  BID, Flecainide 50mg  BID and Lopressor 25mg  BID -will likely need to be started on IV Cardizem gtt and would get ischemic workup prior to cardioversion since he will have to be on Eliquis for 4 weeks prior to be able to come off anticoagulation -will get EP to see him in the ER  3.  OSA -he has a hx of OSA by sleep study in 2019 and CPAP was recommended but never followed up -will get a PSG to evaluate since it has been over 5 years since last sleep study  4.  HTN -BP elevated on exam today ? Related to CP -continue Losartan 25mg  daily and BB  5.  DCM/Chronic systolic CHF -EF 16-10% on echo 02/2022 -continue GDMT with BB and Losartan 25mg  daily and consider changing Losartan to Entresto and adding on Jardiance and MRA   Time Spent: 45 minutes total time of encounter, including 30 minutes spent in face-to-face patient care on the date of this encounter. This time includes coordination of care and counseling regarding above mentioned problem list. Remainder of non-face-to-face time involved reviewing chart documents/testing relevant to the patient encounter and documentation in the medical  record. I  have independently reviewed documentation from referring provider   Medication Adjustments/Labs and Tests Ordered: Current medicines are reviewed at length with the patient today.  Concerns regarding medicines are outlined above.  Orders Placed This Encounter  Procedures   EKG 12-Lead   No orders of the defined types were placed in this encounter.   Signed, Armanda Magic, MD  02/16/2023 1:43 PM    [facility]

## 2023-02-16 NOTE — Patient Instructions (Signed)
Medication Instructions:  Your physician recommends that you continue on your current medications as directed. Please refer to the Current Medication list given to you today.  *If you need a refill on your cardiac medications before your next appointment, please call your pharmacy*   Lab Work: None.  If you have labs (blood work) drawn today and your tests are completely normal, you will receive your results only by: MyChart Message (if you have MyChart) OR A paper copy in the mail If you have any lab test that is abnormal or we need to change your treatment, we will call you to review the results.   Testing/Procedures: Your physician has recommended that you have a sleep study. This test records several body functions during sleep, including: brain activity, eye movement, oxygen and carbon dioxide blood levels, heart rate and rhythm, breathing rate and rhythm, the flow of air through your mouth and nose, snoring, body muscle movements, and chest and belly movement.    Follow-Up: At [facility], you and your health needs are our priority.  As part of our continuing mission to provide you with exceptional heart care, we have created designated Provider Care Teams.  These Care Teams include your primary Cardiologist (physician) and Advanced Practice Providers (APPs -  Physician Assistants and Nurse Practitioners) who all work together to provide you with the care you need, when you need it.  We recommend signing up for the patient portal called "MyChart".  Sign up information is provided on this After Visit Summary.  MyChart is used to connect with patients for Virtual Visits (Telemedicine).  Patients are able to view lab/test results, encounter notes, upcoming appointments, etc.  Non-urgent messages can be sent to your provider as well.   To learn more about what you can do with MyChart, go to [email or web address].      Other Instructions You were sent to the ED due to concern  about your symptoms. If admitted, you should be set up with a follow up appointment.

## 2023-02-16 NOTE — ED Triage Notes (Signed)
Pt went top cardiologist today and was found to be tachycardic in the 130s. Pt reports feeling baseline but is flushed and short of breath. Pt reports chest tightness. Pt takes eliquis. Pt reports taking half of his 25 mg of metoprolol prn for his heart rate today.     Ems  Spo2 95% ra HR aflutter between 120- 160 bpm 162/105 Rr 18  Cbg 98

## 2023-02-16 NOTE — ED Provider Notes (Signed)
[place] EMERGENCY DEPARTMENT AT [facility] Provider Note   CSN: [id number] Arrival date & time: 02/16/23  1416     History  Chief Complaint  Patient presents with   Irregular Heart Beat   Tachycardia    Michael Choi is a 62 y.o. male.  62 year old male with past medical history of atrial flutter/atrial fibrillation on flecainide, metoprolol as needed, and Eliquis presenting with chief complaint of tachycardia.  Patient feels that he has been in atrial flutter for weeks to months, and that this is now very normal for him.  He was at an outpatient clinic this morning to get a sleep study, when providers told him his heart rate was in the 140s to 150s and that he needed to present to the emergency department for further evaluation.  Patient endorses intermittent chest pain/pressure that is nonexertional, nonradiating, nonpleuritic.  Also endorses some degree of shortness of breath associated with this chest pain that also does not seem to be exertional.  He denies syncope, presyncope, blurry vision, etc. of note, patient has had multiple ablations in the past with most recent being 4 years ago.  He has had to be cardioverted on multiple occasions and is typically successful with 1 single shock.  No history of CHF, no history of CAD.  Patient has had no prior MIs or cardiac events.  He denies recent fevers, cough, congestion, dysuria, or other infectious symptoms.  Patient normally takes flecainide twice daily and metoprolol 25        Home Medications Prior to Admission medications   Medication Sig Start Date End Date Taking? Authorizing Provider  flecainide (TAMBOCOR) 100 MG tablet Take 1 tablet (100 mg total) by mouth 2 (two) times daily. 02/16/23  Yes Dyanne Iha, MD  apixaban (ELIQUIS) 5 MG TABS tablet Take 1 tablet (5 mg total) by mouth 2 (two) times daily. 09/22/22   Fenton, Clint R, PA  cephALEXin (KEFLEX) 500 MG capsule Take 500 mg by mouth as directed. 02/13/23  02/23/23  [provider]  flecainide (TAMBOCOR) 100 MG tablet Take 1 tablet (100 mg total) by mouth 2 (two) times daily. 02/16/23   Graciella Freer, PA-C  losartan (COZAAR) 25 MG tablet Take 1 tablet (25 mg total) by mouth daily. 11/28/22   Duke Salvia, MD  metoprolol tartrate (LOPRESSOR) 25 MG tablet Take 1 tablet (25 mg total) by mouth 2 (two) times daily. 02/04/23   Duke Salvia, MD  Multiple Vitamin (MULTIVITAMIN ADULT PO) Take by mouth. 1 daily    [provider]  Multiple Vitamins-Minerals (ONE DAILY CALCIUM/IRON) TABS Take 1 tablet by mouth daily. 07/21/18   [provider]  Omega-3 Fatty Acids (FISH OIL) 1000 MG CAPS Take 1 capsule by mouth daily. 12/05/19   [provider]  omeprazole (PRILOSEC) 40 MG capsule TAKE 1 CAPSULE (40 MG TOTAL) BY MOUTH DAILY. PRILOSEC 40 MG DAILY FOR 8 WEEKS. Patient taking differently: Take 40 mg by mouth daily as needed (acid reflux). 11/11/22   Jenel Lucks, MD  tadalafil (CIALIS) 5 MG tablet Take 5 mg by mouth daily as needed for erectile dysfunction. 01/02/23   [provider]  testosterone cypionate (DEPOTESTOSTERONE CYPIONATE) 200 MG/ML injection Inject 75 mg into the muscle once a week. 01/07/22   [provider]      Allergies    Patient has no active allergies.    Review of Systems   Review of Systems  Physical Exam Updated Vital Signs BP Marland Kitchen)  133/108   Pulse 85   Temp 97.6 F (36.4 C) (Oral)   Resp 15   Ht 6' (1.829 m)   Wt 112 kg   SpO2 96%   BMI 33.49 kg/m  Physical Exam Vitals and nursing note reviewed.  Constitutional:      General: He is not in acute distress.    Appearance: He is well-developed.  HENT:     Head: Normocephalic and atraumatic.  Eyes:     Conjunctiva/sclera: Conjunctivae normal.  Cardiovascular:     Rate and Rhythm: Tachycardia present. Rhythm irregular.     Heart sounds: No murmur heard. Pulmonary:     Effort: Pulmonary effort is normal. No  respiratory distress.     Breath sounds: Normal breath sounds.  Abdominal:     Palpations: Abdomen is soft.     Tenderness: There is no abdominal tenderness.  Musculoskeletal:        General: No swelling.     Cervical back: Neck supple.     Right lower leg: No edema.     Left lower leg: No edema.  Skin:    General: Skin is warm and dry.  Neurological:     General: No focal deficit present.     Mental Status: He is alert and oriented to person, place, and time. Mental status is at baseline.  Psychiatric:        Mood and Affect: Mood normal.     ED Results / Procedures / Treatments   Labs (all labs ordered are listed, but only abnormal results are displayed) Labs Reviewed  CBC WITH DIFFERENTIAL/PLATELET - Abnormal; Notable for the following components:      Result Value   Monocytes Absolute 1.2 (*)    All other components within normal limits  COMPREHENSIVE METABOLIC PANEL - Abnormal; Notable for the following components:   Calcium 8.7 (*)    Total Protein 6.3 (*)    Albumin 3.1 (*)    All other components within normal limits  TROPONIN I (HIGH SENSITIVITY) - Abnormal; Notable for the following components:   Troponin I (High Sensitivity) 20 (*)    All other components within normal limits  TSH  BRAIN NATRIURETIC PEPTIDE  MAGNESIUM  TROPONIN I (HIGH SENSITIVITY)  TROPONIN I (HIGH SENSITIVITY)    EKG None  Radiology DG Chest Portable 1 View  Result Date: 02/16/2023 CLINICAL DATA:  Shortness of breath, chest tightness. EXAM: PORTABLE CHEST 1 VIEW COMPARISON:  Chest x-ray dated 11/20/2022. FINDINGS: Stable cardiomegaly. Lungs are clear. No pleural effusion or pneumothorax is seen. Osseous structures about the chest are unremarkable. IMPRESSION: 1. No active disease. No evidence of pneumonia or pulmonary edema. 2. Stable cardiomegaly. Electronically Signed   By: Bary Richard M.D.   On: 02/16/2023 16:17    Procedures .Sedation  Date/Time: 02/16/2023 5:18 PM  Performed  by: Dyanne Iha, MD Authorized by: Lorre Nick, MD   Consent:    Consent obtained:  Written   Consent given by:  Patient   Risks discussed:  Prolonged hypoxia resulting in organ damage, allergic reaction, dysrhythmia, prolonged sedation necessitating reversal, inadequate sedation, respiratory compromise necessitating ventilatory assistance and intubation, nausea and vomiting   Alternatives discussed:  Analgesia without sedation and anxiolysis Universal protocol:    Immediately prior to procedure, a time out was called: yes   Indications:    Procedure performed:  Cardioversion   Procedure necessitating sedation performed by:  Physician performing sedation Pre-sedation assessment:    Time since last food or drink:  6h   ASA classification: class 1 - normal, healthy patient     Mouth opening:  3 or more finger widths   Thyromental distance:  4 finger widths   Mallampati score:  I - soft palate, uvula, fauces, pillars visible   Neck mobility: normal     Pre-sedation assessments completed and reviewed: airway patency, cardiovascular function, hydration status, mental status, nausea/vomiting, pain level, respiratory function and temperature   Immediate pre-procedure details:    Reassessment: Patient reassessed immediately prior to procedure     Reviewed: vital signs, relevant labs/tests and NPO status     Verified: bag valve mask available, emergency equipment available, intubation equipment available, IV patency confirmed, oxygen available and suction available   Procedure details (see MAR for exact dosages):    Preoxygenation:  Nasal cannula   Sedation:  Etomidate   Intended level of sedation: deep   Intra-procedure monitoring:  Blood pressure monitoring, continuous capnometry, frequent LOC assessments, frequent vital sign checks, continuous pulse oximetry and cardiac monitor   Intra-procedure events: none     Total Provider sedation time (minutes):  15 Post-procedure details:     Attendance: Constant attendance by certified staff until patient recovered     Recovery: Patient returned to pre-procedure baseline     Post-sedation assessments completed and reviewed: airway patency, cardiovascular function, hydration status, mental status, nausea/vomiting, pain level, respiratory function and temperature     Patient is stable for discharge or admission: yes     Procedure completion:  Tolerated well, no immediate complications .Cardioversion  Date/Time: 02/16/2023 7:05 PM  Performed by: Dyanne Iha, MD Authorized by: Lorre Nick, MD   Consent:    Consent obtained:  Written   Consent given by:  Patient   Risks discussed:  Cutaneous burn, induced arrhythmia, death and pain   Alternatives discussed:  No treatment, rate-control medication, alternative treatment, observation, referral and delayed treatment Pre-procedure details:    Cardioversion basis:  Elective   Rhythm:  Atrial fibrillation   Electrode placement:  Anterior-posterior Patient sedated: Yes. Refer to sedation procedure documentation for details of sedation.  Attempt one:    Cardioversion mode:  Synchronous   Shock (Joules):  200   Shock outcome:  Conversion to normal sinus rhythm Post-procedure details:    Patient status:  Alert   Patient tolerance of procedure:  Tolerated well, no immediate complications     Medications Ordered in ED Medications  etomidate (AMIDATE) injection 10 mg (10 mg Intravenous Given 02/16/23 1701)  flecainide (TAMBOCOR) tablet 100 mg (100 mg Oral Given 02/16/23 1736)    ED Course/ Medical Decision Making/ A&P                             Medical Decision Making 62 year old male presenting as above with chief complaint of tachycardia and abnormal heart rhythm.  Upon initial presentation, vital signs notable for irregular heart rhythm with rates in the 120s to 150s.  Initial EKG A-fib with RVR.  Cardiology to bedside for ED cardioversion.  Patient was consented for  sedation and cardioversion with etomidate.  Sedation and cardioversion were performed with no complications.  Please see linked procedure notes for further details.  Cardiology recommendations, patient to start new dose of flecainide at 100 twice daily.  Given dose here in the emergency department.  Provided with prescription to take home.  He had MRI upon reassessment, patient resting comfortably, remains in sinus rhythm.  EKG obtained, confirming conversion to  sinus rhythm.  Vital signs remained stable.  Given the patient has been successfully converted to sinus rhythm, and has been cleared by cardiology, deemed safe and stable for discharge at this time.  Instructed to follow-up with cardiology and primary care provider.  Strict return precautions discussed.  No additional concerns at this time.  Amount and/or Complexity of Data Reviewed Labs: ordered. Radiology: ordered.  Risk Prescription drug management.          Final Clinical Impression(s) / ED Diagnoses Final diagnoses:  Atrial fibrillation, unspecified type (HCC)  Tachycardia    Rx / DC Orders ED Discharge Orders          Ordered    flecainide (TAMBOCOR) 100 MG tablet  2 times daily        02/16/23 1554    flecainide (TAMBOCOR) 100 MG tablet  2 times daily        02/16/23 1841              Dyanne Iha, MD 02/16/23 2317    Lorre Nick, MD 02/17/23 1558

## 2023-02-16 NOTE — Discharge Instructions (Signed)
You have been evaluated in the emergency department for abnormal heart rate.  We have taken a careful history, performed a physical exam, taken blood work and imaging, it successfully performed a cardioversion to convert you back to normal rhythm.  After speaking with her cardiologist, we determined that she would benefit from an increased dose of her flecainide.  We have sent this to your pharmacy at [facility] on [address]  Please be sure to take this medication as prescribed and to follow-up with your primary care provider as well as her cardiologist.  If you experience shortness of breath, chest pain, fast heart rate, or any other new or concerning symptoms, please report back to the emergency department for further evaluation.  Thank you for choosing [facility] emergency department for your care and it was a pleasure treating you today.

## 2023-02-16 NOTE — Consult Note (Signed)
ELECTROPHYSIOLOGY CONSULT NOTE    Patient ID: Michael Choi MRN: [id number], DOB/AGE: 63-29-1962 63 y.o.  Admit date: 02/16/2023 Date of Consult: 02/16/2023  Primary Physician: Patient, No Pcp Per Primary Cardiologist: None  Electrophysiologist: Dr. Graciela Husbands   Referring Provider: Dr. Mayford Knife  Patient Profile: Michael Choi is a 62 y.o. male with a history of AFL s/p CTI 02/2018, and PAF s/p ablation 08/2020, HTN, melanoma who is being seen today for the evaluation of AFL RVR at the request of Dr. Mayford Knife after presenting to the office with AFL RVR and chest pain.  HPI:  Michael Choi is a 62 y.o. male with medical history as above.   Seen by me 11/05/2022 and complained of episodes of syncope, fatigue, and SOB. He stated he had been off flecainide and had reported HRs in 30-40s by pulse ox. BB continued and offered Tikosyn, but pt refused admission.   Seen by Dr. Graciela Husbands 11/28/2022 and clarified that he had at least 2 periods where he tolerated flecainide, and this was restarted at 50 mg BID. Referred to Dr. Lalla Brothers to discuss possibility of redo ablation, currently scheduled for 04/2023  Pt presented to Dr. Mayford Knife today to establish for sleep medicine, but complained of CP and noted to be in AFL with RVR so sent to ED for further management. Currently he is at rest and not symptomatic. Does have SOB and fatigue with exertion, as well as intermittent palpitations. Has not missed any doses if his Eliquis, took flecainide this am.   Does have small excoriations from IV site last week that he has [place] look out, as well as several other macular-papular lesions that look mostly like bug bites of some kind.  Labs (pending)              .    Past Medical History:  Diagnosis Date   Atrial flutter [facility])    s/p CTI ablation by Dr Graciela Husbands   Atypical nevus 04/16/2016   left outer back (mild)    Hypertension    Melanoma (HCC)    stage I,  resected.  did not require chemotherapy or radiation   Persistent  atrial fibrillation [facility])      Surgical History:  Past Surgical History:  Procedure Laterality Date   A-FLUTTER ABLATION N/A 03/05/2018   Procedure: A-FLUTTER ABLATION;  Surgeon: Duke Salvia, MD;  Location: [facility] INVASIVE CV LAB;  Service: Cardiovascular;  Laterality: N/A;   ATRIAL FIBRILLATION ABLATION N/A 08/23/2020   Procedure: ATRIAL FIBRILLATION ABLATION;  Surgeon: Hillis Range, MD;  Location: [facility] INVASIVE CV LAB;  Service: Cardiovascular;  Laterality: N/A;   CARDIOVERSION N/A 11/27/2017   Procedure: CARDIOVERSION;  Surgeon: Elder Negus, MD;  Location: [facility] ENDOSCOPY;  Service: Cardiovascular;  Laterality: N/A;   CARDIOVERSION N/A 03/18/2019   Procedure: CARDIOVERSION;  Surgeon: Lewayne Bunting, MD;  Location: [facility] ENDOSCOPY;  Service: Cardiovascular;  Laterality: N/A;   CARDIOVERSION N/A 07/18/2020   Procedure: CARDIOVERSION;  Surgeon: Jake Bathe, MD;  Location: [facility] ENDOSCOPY;  Service: Cardiovascular;  Laterality: N/A;   CARDIOVERSION N/A 01/18/2021   Procedure: CARDIOVERSION;  Surgeon: Sande Rives, MD;  Location: [facility] ENDOSCOPY;  Service: Cardiovascular;  Laterality: N/A;   KNEE ARTHROSCOPY     TEE WITHOUT CARDIOVERSION N/A 11/27/2017   Procedure: TRANSESOPHAGEAL ECHOCARDIOGRAM (TEE);  Surgeon: Elder Negus, MD;  Location: [facility] ENDOSCOPY;  Service: Cardiovascular;  Laterality: N/A;   TEE WITHOUT CARDIOVERSION N/A 03/18/2019   Procedure: TRANSESOPHAGEAL ECHOCARDIOGRAM (TEE);  Surgeon: Jens Som,  Madolyn Frieze, MD;  Location: [facility] ENDOSCOPY;  Service: Cardiovascular;  Laterality: N/A;     (Not in a hospital admission)   Inpatient Medications:   etomidate  10 mg Intravenous Once    Allergies: No Active Allergies  Family History  Problem Relation Age of Onset   Cancer Mother    Heart disease Father        rheumatic heart disease s/p transplant   Hypertension Brother    Colon cancer Neg Hx    Rectal cancer Neg Hx    Stomach cancer Neg Hx      Physical  Exam: Vitals:   02/16/23 1438 02/16/23 1440  BP:  (!) 152/132  Pulse:  92  Resp:  19  Temp:  98.1 F (36.7 C)  TempSrc:  Oral  SpO2:  97%  Weight: 112 kg   Height: 6' (1.829 m)     GEN- NAD, A&O x 3, normal affect HEENT: Normocephalic, atraumatic Lungs- CTAB, Normal effort.  Heart- Regular rate and rhythm, No M/G/R.  GI- Soft, NT, ND.  Extremities- No clubbing, cyanosis, or edema   Radiology/Studies: No results found.  VFI:EPPIR shows likely atypical flutter at 137 bpm (personally reviewed)  TELEMETRY: atrial flutter in 130-140s (personally reviewed)  Assessment/Plan:  Typical AFL s/p CTI 2019 (SK) PAF s/p ablation 2022 (JA) ? Atypical flutter EKG today shows atypical flutter. Discussed with Dr. Graciela Husbands, who for the time being would recommend increasing flecainide to 100 mg BID. Dr. Lalla Brothers is in agreement.  Will plan for [facility] in ED.  Planned for re-do ablation in October with Dr. Chryl Heck Labs pending. Supplement electrolytes if abnormal for goal of K > 4.0 and Mg > 2.0 Will plan 7-10 day EKG with flecainide increase.   Chest pain Suspect demand ischemia HS trop pending today.  ER visit recent with HS trop peaking in 20s, Highest recently was 50 Calcium score of 0 in 07/2020. Very low likelihood of developing hemodynamically significant CAD within 2.5 years.   7-10 day follow up for EKG, then 1 month with EP APP for f/u.   If pt has further recurrent AFL or if he has bradycardia, will need to re-assess best AAD option.   For questions or updates, please contact [facility] Please consult [email or web address] for contact info under Cardiology/STEMI.  Dustin Flock, PA-C  02/16/2023 3:52 PM

## 2023-02-16 NOTE — ED Triage Notes (Signed)
Pt reports having a rash over his body that appeared after he was hospitalized at [facility]

## 2023-02-25 ENCOUNTER — Ambulatory Visit: Payer: 59

## 2023-02-26 ENCOUNTER — Ambulatory Visit: Payer: 59

## 2023-03-06 ENCOUNTER — Other Ambulatory Visit: Payer: Self-pay

## 2023-03-06 ENCOUNTER — Emergency Department (HOSPITAL_BASED_OUTPATIENT_CLINIC_OR_DEPARTMENT_OTHER)
Admission: EM | Admit: 2023-03-06 | Discharge: 2023-03-06 | Disposition: A | Discharge status: Home / Self Care | Payer: 59 | Attending: Emergency Medicine | Admitting: Emergency Medicine

## 2023-03-06 DIAGNOSIS — T7840XA Allergy, unspecified, initial encounter: Secondary | ICD-10-CM

## 2023-03-06 DIAGNOSIS — I10 Essential (primary) hypertension: Secondary | ICD-10-CM | POA: Diagnosis not present

## 2023-03-06 DIAGNOSIS — Z79899 Other long term (current) drug therapy: Secondary | ICD-10-CM | POA: Diagnosis not present

## 2023-03-06 MED ORDER — FAMOTIDINE IN NACL 20-0.9 MG/50ML-% IV SOLN
20.0000 mg | Freq: Once | INTRAVENOUS | Status: AC
  Administered 2023-03-06: 20 mg via INTRAVENOUS
  Filled 2023-03-06: qty 50

## 2023-03-06 MED ORDER — METHYLPREDNISOLONE SODIUM SUCC 125 MG IJ SOLR
125.0000 mg | Freq: Once | INTRAMUSCULAR | Status: AC
  Administered 2023-03-06: 125 mg via INTRAVENOUS
  Filled 2023-03-06: qty 2

## 2023-03-06 MED ORDER — PREDNISONE 10 MG PO TABS: 20.0000 mg | ORAL_TABLET | Freq: Two times a day (BID) | ORAL | 0 refills | Status: DC

## 2023-03-06 NOTE — ED Provider Notes (Signed)
[place] EMERGENCY DEPARTMENT AT [facility] Provider Note   CSN: [id number] Arrival date & time: 03/06/23  0159     History  Chief Complaint  Patient presents with   Allergic Reaction    Michael Choi is a 62 y.o. male.  Patient is a 62 year old male with past medical history of paroxysmal atrial fibrillation, hypertension.  Patient presenting today for evaluation of irritation to his throat and sinuses.  He woke up from sleep with these symptoms.  He is concerned he is having allergic reaction, but denies any new contacts or exposures.  He took 2 Benadryl prior to coming here and symptoms seem to be improving.  He denies chest pain or difficulty breathing.  The history is provided by the patient.       Home Medications Prior to Admission medications   Medication Sig Start Date End Date Taking? Authorizing Provider  apixaban (ELIQUIS) 5 MG TABS tablet Take 1 tablet (5 mg total) by mouth 2 (two) times daily. 09/22/22   Fenton, Clint R, PA  flecainide (TAMBOCOR) 100 MG tablet Take 1 tablet (100 mg total) by mouth 2 (two) times daily. 02/16/23   Graciella Freer, PA-C  flecainide (TAMBOCOR) 100 MG tablet Take 1 tablet (100 mg total) by mouth 2 (two) times daily. 02/16/23   Dyanne Iha, MD  losartan (COZAAR) 25 MG tablet Take 1 tablet (25 mg total) by mouth daily. 11/28/22   Duke Salvia, MD  metoprolol tartrate (LOPRESSOR) 25 MG tablet Take 1 tablet (25 mg total) by mouth 2 (two) times daily. 02/04/23   Duke Salvia, MD  Multiple Vitamin (MULTIVITAMIN ADULT PO) Take by mouth. 1 daily    [provider]  Multiple Vitamins-Minerals (ONE DAILY CALCIUM/IRON) TABS Take 1 tablet by mouth daily. 07/21/18   [provider]  Omega-3 Fatty Acids (FISH OIL) 1000 MG CAPS Take 1 capsule by mouth daily. 12/05/19   [provider]  omeprazole (PRILOSEC) 40 MG capsule TAKE 1 CAPSULE (40 MG TOTAL) BY MOUTH DAILY. PRILOSEC 40 MG DAILY FOR 8 WEEKS. Patient  taking differently: Take 40 mg by mouth daily as needed (acid reflux). 11/11/22   Jenel Lucks, MD  tadalafil (CIALIS) 5 MG tablet Take 5 mg by mouth daily as needed for erectile dysfunction. 01/02/23   [provider]  testosterone cypionate (DEPOTESTOSTERONE CYPIONATE) 200 MG/ML injection Inject 75 mg into the muscle once a week. 01/07/22   [provider]      Allergies    Patient has no active allergies.    Review of Systems   Review of Systems  All other systems reviewed and are negative.   Physical Exam Updated Vital Signs BP (!) 135/96 (BP Location: Right Arm)   Pulse 63   Temp 97.6 F (36.4 C) (Oral)   Resp 18   SpO2 97%  Physical Exam Vitals and nursing note reviewed.  Constitutional:      General: He is not in acute distress.    Appearance: He is well-developed. He is not diaphoretic.  HENT:     Head: Normocephalic and atraumatic.  Cardiovascular:     Rate and Rhythm: Normal rate and regular rhythm.     Heart sounds: No murmur heard.    No friction rub.  Pulmonary:     Effort: Pulmonary effort is normal. No respiratory distress.     Breath sounds: Normal breath sounds. No wheezing or rales.  Abdominal:     General: Bowel sounds are normal.  There is no distension.     Palpations: Abdomen is soft.     Tenderness: There is no abdominal tenderness.  Musculoskeletal:        General: Normal range of motion.     Cervical back: Normal range of motion and neck supple.  Skin:    General: Skin is warm and dry.  Neurological:     Mental Status: He is alert and oriented to person, place, and time.     Coordination: Coordination normal.     ED Results / Procedures / Treatments   Labs (all labs ordered are listed, but only abnormal results are displayed) Labs Reviewed - No data to display  EKG None  Radiology No results found.  Procedures Procedures    Medications Ordered in ED Medications  famotidine (PEPCID) IVPB 20 mg premix (has  no administration in time range)  methylPREDNISolone sodium succinate (SOLU-MEDROL) 125 mg/2 mL injection 125 mg (125 mg Intravenous Given 03/06/23 0243)    ED Course/ Medical Decision Making/ A&P  Patient was then here with complaints of swelling of his tongue and throat that started while he was asleep.  He denies any new contacts or exposures.  Patient arrives with stable vital signs and is afebrile.  Physical examination basically unremarkable.  Patient given IV Solu-Medrol and Pepcid and is now back to normal.  Patient to be discharged with prednisone and Benadryl for what sounds like some sort of allergic reaction.  Final Clinical Impression(s) / ED Diagnoses Final diagnoses:  None    Rx / DC Orders ED Discharge Orders     None         Geoffery Lyons, MD 03/06/23 [phone]

## 2023-03-06 NOTE — ED Notes (Signed)
Pt. States he feels better,even his sinus'.

## 2023-03-06 NOTE — ED Triage Notes (Signed)
Pt. C/o allergic reaction since going to bed tonight. Pt. C/o swelling to tongue and sinus', feels like tongue is on fire. Recently had flecanide dose was doubled. Took "extra-strength" benadryl pta

## 2023-03-06 NOTE — Discharge Instructions (Signed)
Begin taking prednisone as prescribed.  Begin taking Benadryl 25 mg every 6 hours for the next 3 days.  Return to the ER if symptoms significantly worsen or change.

## 2023-03-13 ENCOUNTER — Telehealth: Payer: Self-pay | Admitting: Internal Medicine

## 2023-03-13 NOTE — Telephone Encounter (Signed)
Took stat call. Talked to pt who stated they have not been taking their metoprolol. Pt's bp is 147/101 with a HR of 142. Pt states they are "shakey" and have been sweating. Pt states he has not been taking his metoprolol but went home and took a tablet and a half. Pt states his HR went down to 130's but came back up. I advised pt to go to nearest ED or urgent care to be evaluated. Pt stated understanding.

## 2023-03-13 NOTE — Telephone Encounter (Signed)
STAT if HR is under 50 or over 120 (normal HR is 60-100 beats per minute)  What is your heart rate?  HR - 142 BP - 147/101  Do you have a log of your heart rate readings (document readings)? No  Do you have any other symptoms? Sweating feels shaky  Call tranferred

## 2023-03-24 ENCOUNTER — Telehealth: Payer: Self-pay | Admitting: Internal Medicine

## 2023-03-24 NOTE — Telephone Encounter (Signed)
Patient called to ask Dr. Graciela Husbands or nurse a question

## 2023-03-25 ENCOUNTER — Other Ambulatory Visit: Payer: Self-pay

## 2023-03-25 ENCOUNTER — Emergency Department (HOSPITAL_COMMUNITY): Payer: 59

## 2023-03-25 ENCOUNTER — Emergency Department (HOSPITAL_COMMUNITY)
Admission: EM | Admit: 2023-03-25 | Discharge: 2023-03-26 | Disposition: A | Discharge status: Home / Self Care | Payer: 59 | Attending: Emergency Medicine | Admitting: Emergency Medicine

## 2023-03-25 DIAGNOSIS — Z79899 Other long term (current) drug therapy: Secondary | ICD-10-CM | POA: Diagnosis not present

## 2023-03-25 DIAGNOSIS — I4892 Unspecified atrial flutter: Secondary | ICD-10-CM | POA: Insufficient documentation

## 2023-03-25 DIAGNOSIS — I10 Essential (primary) hypertension: Secondary | ICD-10-CM | POA: Diagnosis not present

## 2023-03-25 DIAGNOSIS — Z7901 Long term (current) use of anticoagulants: Secondary | ICD-10-CM | POA: Diagnosis not present

## 2023-03-25 DIAGNOSIS — R0602 Shortness of breath: Secondary | ICD-10-CM | POA: Diagnosis present

## 2023-03-25 LAB — CBC
HCT: 50.8 % (ref 39.0–52.0)
Hemoglobin: 17.1 g/dL — ABNORMAL HIGH (ref 13.0–17.0)
MCH: 31 pg (ref 26.0–34.0)
MCHC: 33.7 g/dL (ref 30.0–36.0)
MCV: 92.2 fL (ref 80.0–100.0)
Platelets: 206 10*3/uL (ref 150–400)
RBC: 5.51 MIL/uL (ref 4.22–5.81)
RDW: 14 % (ref 11.5–15.5)
WBC: 8.6 10*3/uL (ref 4.0–10.5)
nRBC: 0 % (ref 0.0–0.2)

## 2023-03-25 LAB — BASIC METABOLIC PANEL
Anion gap: 12 (ref 5–15)
BUN: 19 mg/dL (ref 8–23)
CO2: 24 mmol/L (ref 22–32)
Calcium: 9.2 mg/dL (ref 8.9–10.3)
Chloride: 101 mmol/L (ref 98–111)
Creatinine, Ser: 1.19 mg/dL (ref 0.61–1.24)
GFR, Estimated: >60 mL/min (ref >60)
Glucose, Bld: 136 mg/dL — ABNORMAL HIGH (ref 70–99)
Potassium: 4.5 mmol/L (ref 3.5–5.1)
Sodium: 137 mmol/L (ref 135–145)

## 2023-03-25 LAB — TROPONIN I (HIGH SENSITIVITY)
Troponin I (High Sensitivity): 17 ng/L (ref <18)
Troponin I (High Sensitivity): 15 ng/L (ref <18)

## 2023-03-25 LAB — PROTIME-INR
INR: 1 (ref 0.8–1.2)
Prothrombin Time: 13.7 s (ref 11.4–15.2)

## 2023-03-25 MED ORDER — ONDANSETRON HCL 4 MG/2ML IJ SOLN
INTRAMUSCULAR | Status: AC
  Administered 2023-03-25: 4 mg via INTRAVENOUS
  Filled 2023-03-25: qty 2

## 2023-03-25 MED ORDER — ETOMIDATE 2 MG/ML IV SOLN
10.0000 mg | Freq: Once | INTRAVENOUS | Status: AC
  Administered 2023-03-25: 15 mg via INTRAVENOUS
  Filled 2023-03-25: qty 10

## 2023-03-25 MED ORDER — ONDANSETRON HCL 4 MG/2ML IJ SOLN: 4.0000 mg | Freq: Once | INTRAMUSCULAR | Status: AC

## 2023-03-25 NOTE — Sedation Documentation (Signed)
Pt intermittently bouncing between NSR and SB, MD and PA at bedside and aware

## 2023-03-25 NOTE — Sedation Documentation (Addendum)
Pt shocked by [facility] PA at 150 joules. Pt converted from A-flutter to NSR

## 2023-03-25 NOTE — Sedation Documentation (Signed)
Pulled 4mg  of zofran by cabinet override per verbal order of Jarold Motto MD. 4MG  of zofran given by Jarold Motto MD at bedside

## 2023-03-25 NOTE — Telephone Encounter (Signed)
Spoke with pt who states he has been in Afib since yesterday with rates in the 130's-140's.  Pt states he took multiple doses of Metoprolol and Flecainide yesterday, at least 3 doses of Flecainide and possibly more as well as Metoprolol.  Current HR in the 80's and 90's per pt.   Discussed with Otilio Saber, PA-C who advises pt to be evaluated in the ED at this point.  Pt advised of recommendation.  Pt states he is currently at work and cannot leave at this moment.  Pt advised he should go as soon as possible and not delay care.  If pt develops chest pain, shortness of breath or dizziness he should call 911.  Pt verbalizes understanding and agrees with current plan.

## 2023-03-25 NOTE — ED Triage Notes (Signed)
Patient reports Afib with palpitations /chest tightness and SOB onset yesterday , took multiple doses Flecanide and Metropolol with no relief . His cardiologist is Dr. Graciela Husbands .

## 2023-03-25 NOTE — ED Provider Notes (Signed)
[name] [facility]ERGENCY DEPARTMENT AT [facility] Provider Note   CSN: [id number] Arrival date & time: 03/25/23  2102     History  Chief Complaint  Patient presents with   Palpitations / Afib     Michael Choi is a 62 y.o. male.  Patient presents to the emergency department complaining of irregular heart rate with palpitations and shortness of breath which been ongoing for the past few weeks but worsened yesterday.  Yesterday he states he took 3 times his normal amount of flecainide and 2 tabs of normal and metoprolol as he found his heart rate was in the 150s.  He called his cardiologist and the nurse line recommended to come to the emergency department today for evaluation.  The patient has a history of atrial flutter, most recently cardioverted in July of this year.  There are plans for an ablation in October of this year.  He currently denies nausea, vomiting, abdominal pain.  Past medical history significant for hypertension, atrial flutter, persistent atrial fibrillation, Eliquis usage  HPI     Home Medications Prior to Admission medications   Medication Sig Start Date End Date Taking? Authorizing Provider  apixaban (ELIQUIS) 5 MG TABS tablet Take 1 tablet (5 mg total) by mouth 2 (two) times daily. 09/22/22   Fenton, Clint R, PA  flecainide (TAMBOCOR) 100 MG tablet Take 1 tablet (100 mg total) by mouth 2 (two) times daily. 02/16/23   Graciella Freer, PA-C  flecainide (TAMBOCOR) 100 MG tablet Take 1 tablet (100 mg total) by mouth 2 (two) times daily. 02/16/23   Dyanne Iha, MD  losartan (COZAAR) 25 MG tablet Take 1 tablet (25 mg total) by mouth daily. 11/28/22   Duke Salvia, MD  metoprolol tartrate (LOPRESSOR) 25 MG tablet Take 1 tablet (25 mg total) by mouth 2 (two) times daily. 02/04/23   Duke Salvia, MD  Multiple Vitamin (MULTIVITAMIN ADULT PO) Take by mouth. 1 daily    [provider]  Multiple Vitamins-Minerals (ONE DAILY CALCIUM/IRON) TABS Take 1  tablet by mouth daily. 07/21/18   [provider]  Omega-3 Fatty Acids (FISH OIL) 1000 MG CAPS Take 1 capsule by mouth daily. 12/05/19   [provider]  omeprazole (PRILOSEC) 40 MG capsule TAKE 1 CAPSULE (40 MG TOTAL) BY MOUTH DAILY. PRILOSEC 40 MG DAILY FOR 8 WEEKS. Patient taking differently: Take 40 mg by mouth daily as needed (acid reflux). 11/11/22   Jenel Lucks, MD  predniSONE (DELTASONE) 10 MG tablet Take 2 tablets (20 mg total) by mouth 2 (two) times daily with a meal. 03/06/23   Geoffery Lyons, MD  tadalafil (CIALIS) 5 MG tablet Take 5 mg by mouth daily as needed for erectile dysfunction. 01/02/23   [provider]  testosterone cypionate (DEPOTESTOSTERONE CYPIONATE) 200 MG/ML injection Inject 75 mg into the muscle once a week. 01/07/22   [provider]      Allergies    Patient has no known allergies.    Review of Systems   Review of Systems  Physical Exam Updated Vital Signs BP (!) 136/100   Pulse 60   Temp 98 F (36.7 C)   Resp 16   SpO2 100%  Physical Exam Vitals and nursing note reviewed.  Constitutional:      General: He is not in acute distress.    Appearance: He is well-developed.  HENT:     Head: Normocephalic and atraumatic.  Eyes:     Conjunctiva/sclera: Conjunctivae normal.  Cardiovascular:     Rate and Rhythm: Normal rate and regular rhythm.  Pulmonary:     Effort: Pulmonary effort is normal. No respiratory distress.     Breath sounds: Normal breath sounds.  Abdominal:     Palpations: Abdomen is soft.     Tenderness: There is no abdominal tenderness.  Musculoskeletal:        General: No swelling.     Cervical back: Neck supple.  Skin:    General: Skin is warm and dry.     Capillary Refill: Capillary refill takes less than 2 seconds.  Neurological:     Mental Status: He is alert.  Psychiatric:        Mood and Affect: Mood normal.     ED Results / Procedures / Treatments   Labs (all labs ordered are  listed, but only abnormal results are displayed) Labs Reviewed  BASIC METABOLIC PANEL - Abnormal; Notable for the following components:      Result Value   Glucose, Bld 136 (*)    All other components within normal limits  CBC - Abnormal; Notable for the following components:   Hemoglobin 17.1 (*)    All other components within normal limits  PROTIME-INR  TROPONIN I (HIGH SENSITIVITY)  TROPONIN I (HIGH SENSITIVITY)    EKG EKG Interpretation Date/Time:  Wednesday March 25 2023 22:09:02 EDT Ventricular Rate:  67 PR Interval:    QRS Duration:  105 QT Interval:  398 QTC Calculation: 421 R Axis:   94  Text Interpretation: Atrial flutter with predominant 4:1 AV block Right axis deviation Confirmed by Vonita Moss [phone]) on 03/25/2023 10:22:43 PM  Radiology DG Chest 2 View  Result Date: 03/25/2023 CLINICAL DATA:  Palpitations, chest tightness, and shortness of breath. Atrial fibrillation. EXAM: CHEST - 2 VIEW COMPARISON:  02/16/2023 FINDINGS: Cardiac enlargement. Lungs are clear. No pleural effusions. No pneumothorax. Mediastinal contours appear intact. IMPRESSION: Cardiac enlargement.  No evidence of active pulmonary disease. Electronically Signed   By: Burman Nieves M.D.   On: 03/25/2023 21:36    Procedures .Critical Care  Performed by: Darrick Grinder, PA-C Authorized by: Darrick Grinder, PA-C   Critical care provider statement:    Critical care time (minutes):  30   Critical care was time spent personally by me on the following activities:  Development of treatment plan with patient or surrogate, discussions with consultants, evaluation of patient's response to treatment, examination of patient, ordering and review of laboratory studies, ordering and review of radiographic studies, ordering and performing treatments and interventions, pulse oximetry, re-evaluation of patient's condition and review of old charts .Cardioversion  Date/Time: 03/26/2023 12:23  AM  Performed by: Darrick Grinder, PA-C Authorized by: Darrick Grinder, PA-C   Consent:    Consent obtained:  Verbal   Consent given by:  Patient   Risks discussed:  Cutaneous burn, death, induced arrhythmia and pain   Alternatives discussed:  No treatment Pre-procedure details:    Cardioversion basis:  Elective   Rhythm:  Atrial flutter   Electrode placement:  Anterior-posterior Patient sedated: Yes. Refer to sedation procedure documentation for details of sedation.  Attempt one:    Cardioversion mode:  Synchronous   Waveform:  Biphasic   Shock (Joules):  200   Shock outcome:  Conversion to normal sinus rhythm Post-procedure details:    Patient status:  Awake   Patient tolerance of procedure:  Tolerated well, no immediate complications     Medications Ordered in ED Medications  etomidate (  AMIDATE) injection 10 mg (15 mg Intravenous Given 03/25/23 2319)  ondansetron (ZOFRAN) injection 4 mg (4 mg Intravenous Given 03/25/23 2323)    ED Course/ Medical Decision Making/ A&P                                 Medical Decision Making Amount and/or Complexity of Data Reviewed Labs: ordered. Radiology: ordered.  Risk Prescription drug management.   This patient presents to the ED for concern of palpitations, this involves an extensive number of treatment options, and is a complaint that carries with it a high risk of complications and morbidity.  The differential diagnosis includes atrial fibrillation, atrial flutter, others   Co morbidities that complicate the patient evaluation  History of atrial flutter, persistent atrial fibrillation   Additional history obtained:   External records from outside source obtained and reviewed including cardiology notes   Lab Tests:  I Ordered, and personally interpreted labs.  The pertinent results include: Grossly unremarkable BMP, CBC   Imaging Studies ordered:  I ordered imaging studies including chest x-ray I  independently visualized and interpreted imaging which showed cardiac enlargement I agree with the radiologist interpretation   Cardiac Monitoring: / EKG:  The patient was maintained on a cardiac monitor.  I personally viewed and interpreted the cardiac monitored which showed an underlying rhythm of: Atrial flutter with a 4-1 conduction, repeat EKG showed sinus rhythm after cardioversion    Problem List / ED Course / Critical interventions / Medication management   I ordered medication including etomidate for sedation as recommended by the attending physician  Reevaluation of the patient after these medicines showed that the patient improved I have reviewed the patients home medicines and have made adjustments as needed   Test / Admission - Considered:  Patient was in atrial flutter, converted to sinus rhythm after cardioversion.  Patient feeling much better at this time.  No indication for further emergent workup.  Patient with planned ablation with cardiology in October.  Patient will call cardiology tomorrow to schedule follow-up.  Return precautions provided.         Final Clinical Impression(s) / ED Diagnoses Final diagnoses:  Atrial flutter, unspecified type [facility])    Rx / DC Orders ED Discharge Orders     None         Pamala Duffel 03/26/23 0030    Rondel Baton, MD 03/28/23 1056

## 2023-03-25 NOTE — Telephone Encounter (Signed)
Spoke with pt who states he has again been in Afib since yesterday with rates in the 130's-140's.  Pt states he took multiple doses of Metoprolol and Flecainide yesterday, at least 3 doses of Flecainide and possibly more as well as Metoprolol.  Current HR in the 80's and 90's per pt.   Discussed with Otilio Saber, PA-C who advises pt to be evaluated in the ED at this point.  Pt advised of recommendation.  Pt states he is currently at work and cannot leave at this moment.  Pt advised he should go as soon as possible and not delay care.  If pt develops chest pain, shortness of breath or dizziness he should call 911.  Pt verbalizes understanding and agrees with current plan.

## 2023-03-26 ENCOUNTER — Telehealth: Payer: Self-pay | Admitting: Internal Medicine

## 2023-03-26 ENCOUNTER — Ambulatory Visit: Payer: 59 | Admitting: Student

## 2023-03-26 ENCOUNTER — Encounter: Payer: Self-pay | Admitting: Internal Medicine

## 2023-03-26 NOTE — Telephone Encounter (Signed)
  Pt is requesting to speak with Michael Choi again

## 2023-03-26 NOTE — Discharge Instructions (Signed)
Please follow up with cardiology. If you develop life threatening symptoms such as chest pain or shortness of breath return to the emergency department.

## 2023-03-26 NOTE — Telephone Encounter (Signed)
Spoke with pt who states he did go to ED yesterday and was cardioverted there.  Pt is asking if he is to continue Metoprolol and Flecainide.  Pt advised according to ED note no medication changes were made.  Stressed importance of taking medications as prescribed.  Pt verbalized understanding.  Scheduled follow up appointment with Afib clinic on 04/08/2023.  Pt verbalizes understanding and agrees with current plan.

## 2023-04-03 ENCOUNTER — Encounter: Payer: Self-pay | Admitting: Internal Medicine

## 2023-04-08 ENCOUNTER — Ambulatory Visit (HOSPITAL_COMMUNITY): Payer: 59 | Admitting: Internal Medicine

## 2023-04-09 NOTE — Telephone Encounter (Signed)
Called the patient.  He is at work.  He took an extra metoprolol this am.  His HR is lower and he is feeling better.  No precise reading, he can just feel its lower.  He takes 2nd daily dose tonight around 6pm.    We discussed rapid HRs 120-130 and up sustained should be addressed at the hospital.  Adv of calling for faster response than messaging in the portal.  Also adv of after hours on call if needed.  He is aware to not take any extra flecainide, only metoprolol, as long as BP is not low.  Pt grateful for information provided.

## 2023-04-13 NOTE — Telephone Encounter (Signed)
Appt for 9/26 in place for pt with AF Clinic.

## 2023-04-16 ENCOUNTER — Ambulatory Visit (HOSPITAL_COMMUNITY)
Admission: RE | Admit: 2023-04-16 | Discharge: 2023-04-16 | Disposition: A | Discharge status: Home / Self Care | Payer: 59 | Source: Ambulatory Visit | Attending: Internal Medicine | Admitting: Internal Medicine

## 2023-04-16 VITALS — BP 124/98 | HR 119 | Ht 72.0 in | Wt 243.4 lb

## 2023-04-16 DIAGNOSIS — I428 Other cardiomyopathies: Secondary | ICD-10-CM | POA: Diagnosis present

## 2023-04-16 DIAGNOSIS — Z6833 Body mass index (BMI) 33.0-33.9, adult: Secondary | ICD-10-CM | POA: Insufficient documentation

## 2023-04-16 DIAGNOSIS — Z791 Long term (current) use of non-steroidal anti-inflammatories (NSAID): Secondary | ICD-10-CM | POA: Diagnosis not present

## 2023-04-16 DIAGNOSIS — R9431 Abnormal electrocardiogram [ECG] [EKG]: Secondary | ICD-10-CM | POA: Insufficient documentation

## 2023-04-16 DIAGNOSIS — I4891 Unspecified atrial fibrillation: Secondary | ICD-10-CM

## 2023-04-16 DIAGNOSIS — G4733 Obstructive sleep apnea (adult) (pediatric): Secondary | ICD-10-CM | POA: Diagnosis not present

## 2023-04-16 DIAGNOSIS — E669 Obesity, unspecified: Secondary | ICD-10-CM | POA: Insufficient documentation

## 2023-04-16 DIAGNOSIS — D6869 Other thrombophilia: Secondary | ICD-10-CM | POA: Insufficient documentation

## 2023-04-16 DIAGNOSIS — I10 Essential (primary) hypertension: Secondary | ICD-10-CM | POA: Insufficient documentation

## 2023-04-16 DIAGNOSIS — I4819 Other persistent atrial fibrillation: Secondary | ICD-10-CM | POA: Insufficient documentation

## 2023-04-16 DIAGNOSIS — I4892 Unspecified atrial flutter: Secondary | ICD-10-CM | POA: Diagnosis not present

## 2023-04-16 MED ORDER — METOPROLOL TARTRATE 25 MG PO TABS: 37.5000 mg | ORAL_TABLET | Freq: Two times a day (BID) | ORAL | 3 refills | Status: DC

## 2023-04-16 NOTE — Progress Notes (Addendum)
Primary Care Physician: Patient, No Pcp Per Primary Electrophysiologist: Dr Graciela Husbands Referring Physician: [facility]   Michael Choi is a 62 y.o. male with a history of atrial flutter s/p ablation 03/05/18 with Dr Graciela Husbands, NICM, HTN, OSA, polycythemia and persistent atrial fibrillation who presents for follow up in the [facility]. Patient reports that on 03/05/19 he had palpitations and SOB which lasted about an hour and then again later that night which persisted. He presented to the ER and was found to be in rate controlled afib. He did not undergo DCCV as his anticoagulation was stopped post flutter ablation and the duration of his present episode was unknown. Patient reports that he has been under a lot of stress with being out of work. Patient is s/p TEE/DCCV on 03/18/19. ECG post cardioversion showed NSR. Unfortunately, patient had ERAF.   Patient underwent DCCV on 07/18/20 and again had early return of afib. Seen by Dr Johney Frame and had afib ablation 08/23/20. Seen in follow up 01/04/21 and was back in afib and underwent another DCCV on 01/18/21. Started on flecainide.   Patient went to the ED 03/20/22 with chest discomfort. Work up was unremarkable. He was given a cardiac monitor at discharge, results pending. He self discontinued flecainide and has felt better off the medication.   On follow up today, patient is in SR, no recent symptoms of afib. He still has a mild aching sensation in his chest. No other symptoms at this time. No bleeding issues on anticoagulation.   On follow up 04/16/23, he is currently in Afib. Patient contacted office 9/13 noting he had HR 130s. He was recommended to take metoprolol prn in addition to his prescription of Lopressor 25 mg BID. Review of records show he was in ED on 9/4 due to Afib and was cardioverted successfully; provider documented that patient prior to ED visit had taken additional flecainide and metoprolol on top of his normal  prescriptions. He has not missed any doses of flecainide 100 mg BID. No missed doses of Eliquis. He feels tired when in Afib and like he can go to sleep at any time. He states he was doing testing for sleep apnea but has never heard back from them. He has been taking prn additional half tablets of metoprolol.   Today, he denies symptoms of palpitations, orthopnea, PND, lower extremity edema, dizziness, presyncope, syncope, snoring, daytime somnolence, bleeding, or neurologic sequela. The patient is tolerating medications without difficulties and is otherwise without complaint today.    Atrial Fibrillation Risk Factors:  he does have symptoms or diagnosis of sleep apnea.  He is not on CPAP. He denies any alcohol use. He denies history of rheumatic fever.   he has a BMI of Body mass index is 33.01 kg/m.Marland Kitchen Filed Weights   04/16/23 1455  Weight: 110.4 kg    Family History  Problem Relation Age of Onset   Cancer Mother    Heart disease Father        rheumatic heart disease s/p transplant   Hypertension Brother    Colon cancer Neg Hx    Rectal cancer Neg Hx    Stomach cancer Neg Hx      Atrial Fibrillation Management history:  Previous antiarrhythmic drugs: flecainide  Previous cardioversions: 11/2017, 03/18/19, 07/18/20, 01/18/21, 03/25/23 Previous ablations: 03/05/18 flutter, 08/23/20 CHADS2VASC score: 2 Anticoagulation history: Eliquis   Past Medical History:  Diagnosis Date   Atrial flutter (HCC)    s/p CTI ablation  by Dr Graciela Husbands   Atypical nevus 04/16/2016   left outer back (mild)    Hypertension    Melanoma (HCC)    stage I,  resected.  did not require chemotherapy or radiation   Persistent atrial fibrillation [facility])    Past Surgical History:  Procedure Laterality Date   A-FLUTTER ABLATION N/A 03/05/2018   Procedure: A-FLUTTER ABLATION;  Surgeon: Duke Salvia, MD;  Location: [facility] INVASIVE CV LAB;  Service: Cardiovascular;  Laterality: N/A;   ATRIAL FIBRILLATION ABLATION N/A  08/23/2020   Procedure: ATRIAL FIBRILLATION ABLATION;  Surgeon: Hillis Range, MD;  Location: [facility] INVASIVE CV LAB;  Service: Cardiovascular;  Laterality: N/A;   CARDIOVERSION N/A 11/27/2017   Procedure: CARDIOVERSION;  Surgeon: Elder Negus, MD;  Location: [facility] ENDOSCOPY;  Service: Cardiovascular;  Laterality: N/A;   CARDIOVERSION N/A 03/18/2019   Procedure: CARDIOVERSION;  Surgeon: Lewayne Bunting, MD;  Location: [facility] ENDOSCOPY;  Service: Cardiovascular;  Laterality: N/A;   CARDIOVERSION N/A 07/18/2020   Procedure: CARDIOVERSION;  Surgeon: Jake Bathe, MD;  Location: [facility] ENDOSCOPY;  Service: Cardiovascular;  Laterality: N/A;   CARDIOVERSION N/A 01/18/2021   Procedure: CARDIOVERSION;  Surgeon: Sande Rives, MD;  Location: [facility] ENDOSCOPY;  Service: Cardiovascular;  Laterality: N/A;   KNEE ARTHROSCOPY     TEE WITHOUT CARDIOVERSION N/A 11/27/2017   Procedure: TRANSESOPHAGEAL ECHOCARDIOGRAM (TEE);  Surgeon: Elder Negus, MD;  Location: [facility] ENDOSCOPY;  Service: Cardiovascular;  Laterality: N/A;   TEE WITHOUT CARDIOVERSION N/A 03/18/2019   Procedure: TRANSESOPHAGEAL ECHOCARDIOGRAM (TEE);  Surgeon: Lewayne Bunting, MD;  Location: [facility] ENDOSCOPY;  Service: Cardiovascular;  Laterality: N/A;    Current Outpatient Medications  Medication Sig Dispense Refill   apixaban (ELIQUIS) 5 MG TABS tablet Take 1 tablet (5 mg total) by mouth 2 (two) times daily. 60 tablet 5   Coenzyme Q10 (CO Q 10 PO) Take 1 tablet by mouth 3 (three) times a week.     Cyanocobalamin (VITAMIN B-12 PO) Take 1 tablet by mouth every morning.     flecainide (TAMBOCOR) 100 MG tablet Take 1 tablet (100 mg total) by mouth 2 (two) times daily. 60 tablet 6   losartan (COZAAR) 25 MG tablet Take 1 tablet (25 mg total) by mouth daily. 90 tablet 3   MAGNESIUM PO Take 1 tablet by mouth every morning. Not sure which one.     Multiple Vitamin (MULTIVITAMIN ADULT PO) Take 1 tablet by mouth in the morning.     omeprazole (PRILOSEC) 40  MG capsule TAKE 1 CAPSULE (40 MG TOTAL) BY MOUTH DAILY. PRILOSEC 40 MG DAILY FOR 8 WEEKS. (Patient taking differently: Take 40 mg by mouth daily as needed (acid reflux).) 90 capsule 1   POTASSIUM PO Take 1 tablet by mouth every morning.     tadalafil (CIALIS) 5 MG tablet Take 5 mg by mouth daily as needed for erectile dysfunction.     testosterone cypionate (DEPOTESTOSTERONE CYPIONATE) 200 MG/ML injection Inject 75 mg into the muscle once a week.     triamcinolone cream (KENALOG) 0.1 % Apply topically as needed.     metoprolol tartrate (LOPRESSOR) 25 MG tablet Take 1.5 tablets (37.5 mg total) by mouth 2 (two) times daily. 270 tablet 3   No current facility-administered medications for this encounter.    No Known Allergies  ROS- All systems are reviewed and negative except as per the HPI above.  Physical Exam: Vitals:   04/16/23 1455  BP: (!) 124/98  Pulse: (!) 119  Weight: 110.4 kg  Height: 6' (1.829 m)    GEN- The patient is well appearing, alert and oriented x 3 today.   Neck - no JVD or carotid bruit noted Lungs- Clear to ausculation bilaterally, normal work of breathing Heart- Tachycardic irregular rate and rhythm, no murmurs, rubs or gallops, PMI not laterally displaced Extremities- no clubbing, cyanosis, or edema Skin - no rash or ecchymosis noted   Wt Readings from Last 3 Encounters:  04/16/23 110.4 kg  02/16/23 112 kg  02/16/23 112 kg    EKG today demonstrates  Vent. rate 119 BPM PR interval 176 ms QRS duration 120 ms QT/QTcB 300/422 ms P-R-T axes 77 134 58 Sinus tachycardia - appears to be atrial flutter with RVR Right axis deviation RSR' or QR pattern in V1 suggests right ventricular conduction delay Nonspecific ST abnormality Abnormal ECG When compared with ECG of 25-Mar-2023 22:09, PREVIOUS ECG IS PRESENT  Echo 03/20/22 demonstrated  1. Left ventricular ejection fraction, by estimation, is 45 to 50%. Left  ventricular ejection fraction by PLAX is 43 %.  The left ventricle has  mildly decreased function. The left ventricle demonstrates global  hypokinesis. The left ventricular internal cavity size was mildly dilated. Left ventricular diastolic function could not be evaluated.   2. Right ventricular systolic function is normal. The right ventricular  size is normal. There is normal pulmonary artery systolic pressure.   3. Left atrial size was severely dilated.   4. Right atrial size was severely dilated.   5. The mitral valve is normal in structure. Trivial mitral valve  regurgitation. No evidence of mitral stenosis.   6. The aortic valve is tricuspid. Aortic valve regurgitation is trivial.  No aortic stenosis is present.   7. Aortic dilatation noted. There is moderate dilatation of the aortic  root, measuring 45 mm. There is borderline dilatation of the ascending  aorta, measuring 36 mm.   8. The inferior vena cava is dilated in size with >50% respiratory  variability, suggesting right atrial pressure of 8 mmHg.    Epic records are reviewed at length today   CHA2DS2-VASc Score =    The patient's score is based upon:         ASSESSMENT AND PLAN: 1. Persistent Atrial Fibrillation/atrial flutter The patient's CHA2DS2-VASc score is  , indicating a  % annual risk of stroke.   S/p flutter ablation 03/05/18.   Patient in atrial flutter with RVR today. He is scheduled for ablation next month. He has had early return of arrhythmia after ED cardioversion on 9/4. In the time before ablation, can consider rate control vs amiodarone. After discussion, patient would like to rate control until ablation. Will increase metoprolol to 37.5 mg BID. Rhythm monitoring device recommended. Trend HR at home.   2. Secondary Hypercoagulable State (ICD10:  D68.69) The patient is at significant risk for stroke/thromboembolism based upon his CHA2DS2-VASc Score of  .  Continue Apixaban (Eliquis).  No missed doses.   3. Obesity Body mass index is 33.01  kg/m. Lifestyle modification was discussed and encouraged including regular physical activity and weight reduction.  4. Obstructive sleep apnea Patient is not on CPAP therapy. Severely dilated RA  5. HTN Stable, no changes today.  6. Nonischemic Cardiomyopathy EF improved to 45-50% by last echo.  Fluid status appears stable today.   Follow up as scheduled for CT prior to ablation.   Justin Mend, PA-C Afib Clinic [facility] [address] [place], [place] [address] [phone] 04/16/2023 3:59 PM

## 2023-04-16 NOTE — Patient Instructions (Addendum)
Increase metoprolol to 37.5mg  twice a day (1 and 1/2 tablets twice a day)

## 2023-04-17 ENCOUNTER — Ambulatory Visit (HOSPITAL_COMMUNITY): Payer: 59 | Admitting: Internal Medicine

## 2023-05-06 ENCOUNTER — Telehealth (HOSPITAL_COMMUNITY): Payer: Self-pay | Admitting: *Deleted

## 2023-05-06 NOTE — Telephone Encounter (Signed)
Reaching out to patient to offer assistance regarding upcoming cardiac imaging study; pt verbalizes understanding of appt date/time, parking situation and where to check in, pre-test NPO status and verified current allergies; name and call back number provided for further questions should they arise  Larey Brick RN Navigator Cardiac Imaging [facility] [phone] office [phone] cell  Patient aware to arrive at 9:30 AM.

## 2023-05-07 ENCOUNTER — Ambulatory Visit (HOSPITAL_COMMUNITY)
Admission: RE | Admit: 2023-05-07 | Discharge: 2023-05-07 | Disposition: A | Discharge status: Home / Self Care | Payer: 59 | Source: Ambulatory Visit | Attending: Cardiology | Admitting: Cardiology

## 2023-05-07 DIAGNOSIS — Z79899 Other long term (current) drug therapy: Secondary | ICD-10-CM | POA: Diagnosis present

## 2023-05-07 DIAGNOSIS — I4892 Unspecified atrial flutter: Secondary | ICD-10-CM

## 2023-05-07 DIAGNOSIS — I5022 Chronic systolic (congestive) heart failure: Secondary | ICD-10-CM

## 2023-05-07 DIAGNOSIS — I471 Supraventricular tachycardia, unspecified: Secondary | ICD-10-CM

## 2023-05-07 DIAGNOSIS — I4819 Other persistent atrial fibrillation: Secondary | ICD-10-CM

## 2023-05-07 MED ORDER — IOHEXOL 350 MG/ML SOLN
95.0000 mL | Freq: Once | INTRAVENOUS | Status: AC | PRN
  Administered 2023-05-07: 95 mL via INTRAVENOUS

## 2023-05-15 NOTE — Pre-Procedure Instructions (Signed)
Instructed patient on the following items: Arrival time 0800 Nothing to eat or drink after midnight No meds AM of procedure Responsible person to drive you home and stay with you for 24 hrs  Have you missed any doses of anti-coagulant Eliquis- takes twice a day, hasn't missed any doses.Don't take any on Monday morning.

## 2023-05-18 ENCOUNTER — Other Ambulatory Visit: Payer: Self-pay

## 2023-05-18 ENCOUNTER — Other Ambulatory Visit (HOSPITAL_COMMUNITY): Payer: Self-pay

## 2023-05-18 ENCOUNTER — Ambulatory Visit (HOSPITAL_BASED_OUTPATIENT_CLINIC_OR_DEPARTMENT_OTHER): Payer: 59

## 2023-05-18 ENCOUNTER — Ambulatory Visit (HOSPITAL_COMMUNITY): Payer: 59

## 2023-05-18 ENCOUNTER — Ambulatory Visit (HOSPITAL_COMMUNITY)
Admission: RE | Admit: 2023-05-18 | Discharge: 2023-05-18 | Disposition: A | Discharge status: Home / Self Care | Payer: 59 | Attending: Cardiology | Admitting: Cardiology

## 2023-05-18 ENCOUNTER — Encounter (HOSPITAL_COMMUNITY)
Admission: RE | Disposition: A | Discharge status: Home / Self Care | Payer: Self-pay | Source: Home / Self Care | Attending: Cardiology

## 2023-05-18 DIAGNOSIS — I4891 Unspecified atrial fibrillation: Secondary | ICD-10-CM

## 2023-05-18 DIAGNOSIS — K219 Gastro-esophageal reflux disease without esophagitis: Secondary | ICD-10-CM | POA: Diagnosis not present

## 2023-05-18 DIAGNOSIS — I4819 Other persistent atrial fibrillation: Secondary | ICD-10-CM | POA: Insufficient documentation

## 2023-05-18 DIAGNOSIS — I480 Paroxysmal atrial fibrillation: Secondary | ICD-10-CM

## 2023-05-18 DIAGNOSIS — G4730 Sleep apnea, unspecified: Secondary | ICD-10-CM | POA: Diagnosis not present

## 2023-05-18 DIAGNOSIS — I110 Hypertensive heart disease with heart failure: Secondary | ICD-10-CM | POA: Diagnosis not present

## 2023-05-18 DIAGNOSIS — I484 Atypical atrial flutter: Secondary | ICD-10-CM | POA: Insufficient documentation

## 2023-05-18 DIAGNOSIS — I5022 Chronic systolic (congestive) heart failure: Secondary | ICD-10-CM | POA: Insufficient documentation

## 2023-05-18 DIAGNOSIS — I471 Supraventricular tachycardia, unspecified: Secondary | ICD-10-CM | POA: Diagnosis not present

## 2023-05-18 DIAGNOSIS — I428 Other cardiomyopathies: Secondary | ICD-10-CM | POA: Diagnosis not present

## 2023-05-18 HISTORY — PX: ATRIAL FIBRILLATION ABLATION: EP1191

## 2023-05-18 LAB — BASIC METABOLIC PANEL
Anion gap: 7 (ref 5–15)
BUN: 15 mg/dL (ref 8–23)
CO2: 23 mmol/L (ref 22–32)
Calcium: 8.9 mg/dL (ref 8.9–10.3)
Chloride: 107 mmol/L (ref 98–111)
Creatinine, Ser: 1.2 mg/dL (ref 0.61–1.24)
GFR, Estimated: >60 mL/min (ref >60)
Glucose, Bld: 102 mg/dL — ABNORMAL HIGH (ref 70–99)
Potassium: 4.5 mmol/L (ref 3.5–5.1)
Sodium: 137 mmol/L (ref 135–145)

## 2023-05-18 LAB — CBC
HCT: 54.5 % — ABNORMAL HIGH (ref 39.0–52.0)
Hemoglobin: 18.5 g/dL — ABNORMAL HIGH (ref 13.0–17.0)
MCH: 32.2 pg (ref 26.0–34.0)
MCHC: 33.9 g/dL (ref 30.0–36.0)
MCV: 94.8 fL (ref 80.0–100.0)
Platelets: 225 10*3/uL (ref 150–400)
RBC: 5.75 MIL/uL (ref 4.22–5.81)
RDW: 14 % (ref 11.5–15.5)
WBC: 7 10*3/uL (ref 4.0–10.5)
nRBC: 0 % (ref 0.0–0.2)

## 2023-05-18 LAB — POCT ACTIVATED CLOTTING TIME
Activated Clotting Time: 281 s
Activated Clotting Time: 324 s

## 2023-05-18 SURGERY — ATRIAL FIBRILLATION ABLATION
Anesthesia: General

## 2023-05-18 MED ORDER — ONDANSETRON HCL 4 MG/2ML IJ SOLN
4.0000 mg | Freq: Four times a day (QID) | INTRAMUSCULAR | Status: DC | PRN
  Administered 2023-05-18: 4 mg via INTRAVENOUS

## 2023-05-18 MED ORDER — SODIUM CHLORIDE 0.9 % IV SOLN: INTRAVENOUS | Status: DC

## 2023-05-18 MED ORDER — HEPARIN (PORCINE) IN NACL 1000-0.9 UT/500ML-% IV SOLN
INTRAVENOUS | Status: DC | PRN
  Administered 2023-05-18 (×3): 500 mL

## 2023-05-18 MED ORDER — HEPARIN SODIUM (PORCINE) 1000 UNIT/ML IJ SOLN
INTRAMUSCULAR | Status: AC
  Filled 2023-05-18: qty 10

## 2023-05-18 MED ORDER — ACETAMINOPHEN 325 MG PO TABS
650.0000 mg | ORAL_TABLET | ORAL | Status: DC | PRN
  Filled 2023-05-18: qty 2

## 2023-05-18 MED ORDER — EPHEDRINE SULFATE-NACL 50-0.9 MG/10ML-% IV SOSY
PREFILLED_SYRINGE | INTRAVENOUS | Status: DC | PRN
  Administered 2023-05-18: 10 mg via INTRAVENOUS

## 2023-05-18 MED ORDER — AMISULPRIDE (ANTIEMETIC) 5 MG/2ML IV SOLN
INTRAVENOUS | Status: AC
  Filled 2023-05-18: qty 4

## 2023-05-18 MED ORDER — ROCURONIUM BROMIDE 10 MG/ML (PF) SYRINGE
PREFILLED_SYRINGE | INTRAVENOUS | Status: DC | PRN
  Administered 2023-05-18: 50 mg via INTRAVENOUS
  Administered 2023-05-18: 30 mg via INTRAVENOUS
  Administered 2023-05-18: 50 mg via INTRAVENOUS

## 2023-05-18 MED ORDER — COLCHICINE 0.6 MG PO TABS
0.6000 mg | ORAL_TABLET | Freq: Two times a day (BID) | ORAL | 0 refills | Status: DC
  Filled 2023-05-18: qty 10, 5d supply, fill #0

## 2023-05-18 MED ORDER — SODIUM CHLORIDE 0.9 % IV SOLN: 250.0000 mL | INTRAVENOUS | Status: DC | PRN

## 2023-05-18 MED ORDER — PANTOPRAZOLE SODIUM 40 MG PO TBEC
40.0000 mg | DELAYED_RELEASE_TABLET | Freq: Every day | ORAL | Status: DC
  Administered 2023-05-18: 40 mg via ORAL
  Filled 2023-05-18 (×2): qty 1

## 2023-05-18 MED ORDER — APIXABAN 5 MG PO TABS
5.0000 mg | ORAL_TABLET | Freq: Two times a day (BID) | ORAL | Status: DC
  Administered 2023-05-18: 5 mg via ORAL
  Filled 2023-05-18: qty 1

## 2023-05-18 MED ORDER — ONDANSETRON HCL 4 MG/2ML IJ SOLN
INTRAMUSCULAR | Status: AC
  Filled 2023-05-18: qty 2

## 2023-05-18 MED ORDER — PROTAMINE SULFATE 10 MG/ML IV SOLN
INTRAVENOUS | Status: DC | PRN
  Administered 2023-05-18: 30 mg via INTRAVENOUS

## 2023-05-18 MED ORDER — ONDANSETRON HCL 4 MG/2ML IJ SOLN
INTRAMUSCULAR | Status: DC | PRN
  Administered 2023-05-18: 4 mg via INTRAVENOUS

## 2023-05-18 MED ORDER — HEPARIN SODIUM (PORCINE) 1000 UNIT/ML IJ SOLN
INTRAMUSCULAR | Status: DC | PRN
  Administered 2023-05-18: 5000 [IU] via INTRAVENOUS
  Administered 2023-05-18: 3000 [IU] via INTRAVENOUS
  Administered 2023-05-18: 16000 [IU] via INTRAVENOUS

## 2023-05-18 MED ORDER — PROPOFOL 10 MG/ML IV BOLUS
INTRAVENOUS | Status: DC | PRN
  Administered 2023-05-18: 200 mg via INTRAVENOUS

## 2023-05-18 MED ORDER — ATROPINE SULFATE 1 MG/10ML IJ SOSY
PREFILLED_SYRINGE | INTRAMUSCULAR | Status: AC
  Filled 2023-05-18: qty 10

## 2023-05-18 MED ORDER — PHENYLEPHRINE HCL-NACL 20-0.9 MG/250ML-% IV SOLN
INTRAVENOUS | Status: DC | PRN
  Administered 2023-05-18: 30 ug/min via INTRAVENOUS

## 2023-05-18 MED ORDER — AMISULPRIDE (ANTIEMETIC) 5 MG/2ML IV SOLN
10.0000 mg | Freq: Once | INTRAVENOUS | Status: AC
  Administered 2023-05-18: 10 mg via INTRAVENOUS

## 2023-05-18 MED ORDER — SUGAMMADEX SODIUM 200 MG/2ML IV SOLN
INTRAVENOUS | Status: DC | PRN
  Administered 2023-05-18: 200 mg via INTRAVENOUS

## 2023-05-18 MED ORDER — SODIUM CHLORIDE 0.9% FLUSH: 3.0000 mL | INTRAVENOUS | Status: DC | PRN

## 2023-05-18 MED ORDER — PHENYLEPHRINE 80 MCG/ML (10ML) SYRINGE FOR IV PUSH (FOR BLOOD PRESSURE SUPPORT)
PREFILLED_SYRINGE | INTRAVENOUS | Status: DC | PRN
  Administered 2023-05-18 (×2): 160 ug via INTRAVENOUS
  Administered 2023-05-18 (×2): 240 ug via INTRAVENOUS
  Administered 2023-05-18 (×3): 160 ug via INTRAVENOUS

## 2023-05-18 MED ORDER — DEXAMETHASONE SODIUM PHOSPHATE 10 MG/ML IJ SOLN
INTRAMUSCULAR | Status: DC | PRN
  Administered 2023-05-18: 4 mg via INTRAVENOUS

## 2023-05-18 MED ORDER — COLCHICINE 0.6 MG PO TABS
0.6000 mg | ORAL_TABLET | Freq: Two times a day (BID) | ORAL | Status: DC
  Administered 2023-05-18: 0.6 mg via ORAL
  Filled 2023-05-18 (×2): qty 1

## 2023-05-18 MED ORDER — HEPARIN SODIUM (PORCINE) 1000 UNIT/ML IJ SOLN
INTRAMUSCULAR | Status: DC | PRN
  Administered 2023-05-18: 1000 [IU]

## 2023-05-18 MED ORDER — PANTOPRAZOLE SODIUM 40 MG PO TBEC
40.0000 mg | DELAYED_RELEASE_TABLET | Freq: Every day | ORAL | 0 refills | Status: DC
  Filled 2023-05-18: qty 30, 30d supply, fill #0

## 2023-05-18 MED ORDER — SODIUM CHLORIDE 0.9% FLUSH
3.0000 mL | Freq: Two times a day (BID) | INTRAVENOUS | Status: DC
Start: 2023-05-18 — End: 2023-05-18

## 2023-05-18 MED ORDER — ATROPINE SULFATE 1 MG/10ML IJ SOSY
PREFILLED_SYRINGE | INTRAMUSCULAR | Status: DC | PRN
  Administered 2023-05-18: 1 mg via INTRAVENOUS

## 2023-05-18 SURGICAL SUPPLY — 24 items
BAG SNAP BAND KOVER 36X36 (MISCELLANEOUS) IMPLANT
BLANKET WARM UNDERBOD FULL ACC (MISCELLANEOUS) ×2 IMPLANT
CABLE PFA RX CATH CONN (CABLE) IMPLANT
CATH 8FR REPROCESSED SOUNDSTAR (CATHETERS) ×1 IMPLANT
CATH 8FR SOUNDSTAR REPROCESSED (CATHETERS) IMPLANT
CATH ABLAT QDOT MICRO BI TC DF (CATHETERS) IMPLANT
CATH FARAWAVE ABLATION 31 (CATHETERS) IMPLANT
CATH OCTARAY 2.0 F 3-3-3-3-3 (CATHETERS) IMPLANT
CATH SMTCH THERMOCOOL SF DF (CATHETERS) IMPLANT
CATH WEBSTER BI DIR CS D-F CRV (CATHETERS) IMPLANT
CLOSURE PERCLOSE PROSTYLE (VASCULAR PRODUCTS) IMPLANT
COVER SWIFTLINK CONNECTOR (BAG) ×2 IMPLANT
DILATOR VESSEL 38 20CM 16FR (INTRODUCER) IMPLANT
GUIDEWIRE INQWIRE 1.5J.035X260 (WIRE) IMPLANT
INQWIRE 1.5J .035X260CM (WIRE) ×1
PACK EP LATEX FREE (CUSTOM PROCEDURE TRAY) ×1
PACK EP LF (CUSTOM PROCEDURE TRAY) ×2 IMPLANT
PAD DEFIB RADIO PHYSIO CONN (PAD) ×2 IMPLANT
PATCH CARTO3 (PAD) IMPLANT
SHEATH FARADRIVE STEERABLE (SHEATH) IMPLANT
SHEATH PINNACLE 8F 10CM (SHEATH) IMPLANT
SHEATH PINNACLE 9F 10CM (SHEATH) IMPLANT
SHEATH PROBE COVER 6X72 (BAG) IMPLANT
SHEATH WIRE KIT BAYLIS SL1 (KITS) IMPLANT

## 2023-05-18 NOTE — Anesthesia Preprocedure Evaluation (Addendum)
Anesthesia Evaluation  Patient identified by MRN, date of birth, ID band Patient awake    Reviewed: Allergy & Precautions, NPO status , Patient's Chart, lab work & pertinent test results  Airway Mallampati: II  TM Distance: >3 FB Neck ROM: Full    Dental no notable dental hx.    Pulmonary neg pulmonary ROS   Pulmonary exam normal        Cardiovascular hypertension, + dysrhythmias Atrial Fibrillation  Rhythm:Irregular Rate:Normal  ECHO 2023:  1. Left ventricular ejection fraction, by estimation, is 45 to 50%. Left  ventricular ejection fraction by PLAX is 43 %. The left ventricle has  mildly decreased function. The left ventricle demonstrates global  hypokinesis. The left ventricular internal  cavity size was mildly dilated. Left ventricular diastolic function could  not be evaluated.   2. Right ventricular systolic function is normal. The right ventricular  size is normal. There is normal pulmonary artery systolic pressure.   3. Left atrial size was severely dilated.   4. Right atrial size was severely dilated.   5. The mitral valve is normal in structure. Trivial mitral valve  regurgitation. No evidence of mitral stenosis.   6. The aortic valve is tricuspid. Aortic valve regurgitation is trivial.  No aortic stenosis is present.   7. Aortic dilatation noted. There is moderate dilatation of the aortic  root, measuring 45 mm. There is borderline dilatation of the ascending  aorta, measuring 36 mm.   8. The inferior vena cava is dilated in size with >50% respiratory  variability, suggesting right atrial pressure of 8 mmHg.      Neuro/Psych negative neurological ROS  negative psych ROS   GI/Hepatic Neg liver ROS,GERD  Medicated,,  Endo/Other  negative endocrine ROS    Renal/GU negative Renal ROS  negative genitourinary   Musculoskeletal negative musculoskeletal ROS (+)    Abdominal Normal abdominal exam  (+)    Peds  Hematology Lab Results      Component                Value               Date                      WBC                      7.0                 05/18/2023                HGB                      18.5 (H)            05/18/2023                HCT                      54.5 (H)            05/18/2023                MCV                      94.8                05/18/2023  PLT                      225                 05/18/2023             Lab Results      Component                Value               Date                      NA                       137                 05/18/2023                K                        4.5                 05/18/2023                CO2                      23                  05/18/2023                GLUCOSE                  102 (H)             05/18/2023                BUN                      15                  05/18/2023                CREATININE               1.20                05/18/2023                CALCIUM                  8.9                 05/18/2023                GFRNONAA                 >60                 05/18/2023              Anesthesia Other Findings   Reproductive/Obstetrics                             Anesthesia Physical Anesthesia Plan  ASA: 3  Anesthesia Plan: General   Post-op Pain Management:    Induction: Intravenous  PONV Risk Score and Plan: 2 and Ondansetron, Dexamethasone, Midazolam and Treatment may  vary due to age or medical condition  Airway Management Planned: Mask and Oral ETT  Additional Equipment: None  Intra-op Plan:   Post-operative Plan: Extubation in OR  Informed Consent: I have reviewed the patients History and Physical, chart, labs and discussed the procedure including the risks, benefits and alternatives for the proposed anesthesia with the patient or authorized representative who has indicated his/her understanding and acceptance.     Dental  advisory given  Plan Discussed with: CRNA  Anesthesia Plan Comments:        Anesthesia Quick Evaluation

## 2023-05-18 NOTE — H&P (Signed)
Electrophysiology Office Follow up Visit Note:     Date:  05/18/2023    ID:  Michael Choi, DOB [birth year], MRN [id number]   PCP:  Patient, No Pcp Per      [facility] Cardiologist:  None  [facility] Electrophysiologist:  Lanier Prude, MD      Interval History:     Michael Choi is a 62 y.o. male who presents for a follow up visit.    Last seen Nov 28, 2022 by Dr. Graciela Husbands.  He has a history of atrial flutter post ablation in August 2019.  He also has a history of atrial fibrillation and was ablated by Dr. Johney Frame in February 2022.  He has chronic systolic heart failure secondary to a nonischemic cardiomyopathy.  He has untreated sleep apnea.  He has been on flecainide in the past.   During his February 2022 ablation by Dr. Johney Frame, the pulmonary veins and posterior wall were isolated.   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  His episodes of arrhythmia have become much more frequent.  He is experiencing daily episodes of atrial fibrillation that are symptomatic.  Presents for PVI. Procedure reviewed.   Objective Past medical, surgical, social and family history were reviewed.   ROS:   Please see the history of present illness.    All other systems reviewed and are negative.   EKGs/Labs/Other Studies Reviewed:     The following studies were reviewed today:   Dec 17, 2022 ZIO monitor personally reviewed 2% A-fib/flutter burden, average ventricular rate 161 bpm Frequent bursts of SVT and atrial ectopy   EKG:  The ekg ordered today demonstrates sinus rhythm with PACs.  PR is 160 ms.  QRS duration is 94 ms.     Physical Exam:     VS:  BP 125/87 (BP Location: Left Arm, Patient Position: Sitting, Cuff Size: Large)   Pulse 84   Ht 6' (1.829 m)   Wt 247 lb 8 oz (112.3 kg)   SpO2 97%   BMI 33.57 kg/m         Wt Readings from Last 3 Encounters:  01/07/23 247 lb 8 oz (112.3 kg)  11/28/22 253 lb 3.2 oz (114.9 kg)  11/05/22 249 lb 12.8 oz (113.3  kg)      GEN:  Well nourished, well developed in no acute distress CARDIAC: Irregular rhythm, no murmurs, rubs, gallops RESPIRATORY:  Clear to auscultation without rales, wheezing or rhonchi          Assessment ASSESSMENT:     1. Atrial flutter, unspecified type (HCC)   2. Persistent atrial fibrillation (HCC)   3. SVT (supraventricular tachycardia)   4. Chronic systolic heart failure (HCC)   5. Encounter for long-term (current) use of high-risk medication     PLAN:     In order of problems listed above:     #Persistent atrial fibrillation and flutter #High risk drug monitoring-flecainide Confirmed by ZIO monitor.  I suspect he has had a reconnection of one of the pulmonary vein sets from prior ablation.  The amount of ectopy could very well be a PV A. tach.  I discussed treatment options with the patient including antiarrhythmic drug therapy other than flecainide versus repeat catheter ablation.  Given his young age, favor catheter ablation to avoid long-term exposure to the antiarrhythmic drugs.  He is in agreement.   Discussed treatment options today for AF including antiarrhythmic drug therapy and ablation. Discussed risks, recovery  and likelihood of success with each treatment strategy. Risk, benefits, and alternatives to EP study and radiofrequency ablation for afib were discussed. These risks include but are not limited to stroke, bleeding, vascular damage, tamponade, perforation, damage to the esophagus, lungs, phrenic nerve and other structures, pulmonary vein stenosis, worsening renal function, and death.  Discussed potential need for repeat ablation procedures and antiarrhythmic drugs after an initial ablation. The patient understands these risk and wishes to proceed.  We will therefore proceed with catheter ablation at the next available time.  Carto, ICE, anesthesia are requested for the procedure.  Will also obtain CT PV protocol prior to the procedure to exclude LAA thrombus  and further evaluate atrial anatomy.   PR and QRS duration acceptable for continued flecainide use.  Plan to stop flecainide 90 days after catheter ablation as long as he has had improvement in rhythm.   #Chronic systolic heart failure NYHA class II.  Last EF 45 to 50% in August 2023.  Warm and dry on exam today. Rhythm control indicated.      Presents for PVI today. Procedure reviewed.       Signed, Steffanie Dunn, MD, [facility], [facility] 05/18/2023 Electrophysiology [facility]

## 2023-05-18 NOTE — Discharge Instructions (Signed)

## 2023-05-18 NOTE — Transfer of Care (Signed)
Immediate Anesthesia Transfer of Care Note  Patient: Michael Choi  Procedure(s) Performed: ATRIAL FIBRILLATION ABLATION  Patient Location: PACU  Anesthesia Type:General  Level of Consciousness: awake and alert   Airway & Oxygen Therapy: Patient Spontanous Breathing and Patient connected to face mask oxygen  Post-op Assessment: Report given to RN and Post -op Vital signs reviewed and stable  Post vital signs: Reviewed and stable  Last Vitals:  Vitals Value Taken Time  BP    Temp    Pulse    Resp    SpO2      Last Pain:  Vitals:   05/18/23 0821  PainSc: 0-No pain         Complications:  Encounter Notable Events  Notable Event Outcome Phase Comment  Difficult to intubate - expected  Intraprocedure Filed from anesthesia note documentation.

## 2023-05-18 NOTE — Anesthesia Procedure Notes (Signed)
Procedure Name: Intubation Date/Time: 05/18/2023 11:06 AM  Performed by: Sandie Ano, CRNAPre-anesthesia Checklist: Patient identified, Emergency Drugs available, Suction available and Patient being monitored Patient Re-evaluated:Patient Re-evaluated prior to induction Oxygen Delivery Method: Circle System Utilized Preoxygenation: Pre-oxygenation with 100% oxygen Induction Type: IV induction Ventilation: Mask ventilation without difficulty Laryngoscope Size: Mac, 3 and Glidescope Grade View: Grade III Tube type: Oral Number of attempts: 1 Airway Equipment and Method: Stylet and Oral airway Placement Confirmation: ETT inserted through vocal cords under direct vision, positive ETCO2 and breath sounds checked- equal and bilateral Secured at: 22 cm Tube secured with: Tape Dental Injury: Teeth and Oropharynx as per pre-operative assessment  Difficulty Due To: Difficult Airway- due to immobile epiglottis, Difficulty was anticipated and Difficult Airway- due to anterior larynx Future Recommendations: Recommend- induction with short-acting agent, and alternative techniques readily available

## 2023-05-19 ENCOUNTER — Encounter (HOSPITAL_COMMUNITY): Payer: Self-pay | Admitting: Cardiology

## 2023-05-19 NOTE — Anesthesia Postprocedure Evaluation (Signed)
Anesthesia Post Note  Patient: Michael Choi  Procedure(s) Performed: ATRIAL FIBRILLATION ABLATION     Patient location during evaluation: PACU Anesthesia Type: General Level of consciousness: awake and alert Pain management: pain level controlled Vital Signs Assessment: post-procedure vital signs reviewed and stable Respiratory status: spontaneous breathing, nonlabored ventilation, respiratory function stable and patient connected to nasal cannula oxygen Cardiovascular status: blood pressure returned to baseline and stable Postop Assessment: no apparent nausea or vomiting Anesthetic complications: yes   Encounter Notable Events  Notable Event Outcome Phase Comment  Difficult to intubate - expected  Intraprocedure Filed from anesthesia note documentation.    Last Vitals:  Vitals:   05/18/23 1730 05/18/23 1745  BP: 120/83 119/75  Pulse: 82 83  Resp: 20 20  Temp:    SpO2: 91% 91%    Last Pain:  Vitals:   05/19/23 0902  TempSrc:   PainSc: 0-No pain                 Earl Lites P Kaydynce Pat

## 2023-05-21 ENCOUNTER — Telehealth: Payer: Self-pay | Admitting: Cardiology

## 2023-05-21 NOTE — Telephone Encounter (Signed)
Patient had an ablation on 10/28 with Dr. Lalla Brothers and needs a note for work excusing him for one week. Advised that I will put the note in his MyChart. He will call back if he needs to get the letter a different way.

## 2023-05-21 NOTE — Telephone Encounter (Signed)
Work for 05/18/23-05/22/23 pt states Dr Lalla Brothers is aware he is not worked.

## 2023-05-22 ENCOUNTER — Other Ambulatory Visit: Payer: Self-pay | Admitting: Physician Assistant

## 2023-05-22 ENCOUNTER — Other Ambulatory Visit: Payer: Self-pay | Admitting: Internal Medicine

## 2023-05-22 DIAGNOSIS — I4819 Other persistent atrial fibrillation: Secondary | ICD-10-CM

## 2023-05-22 NOTE — Telephone Encounter (Signed)
Refill sent by Ventura Sellers, RN

## 2023-05-22 NOTE — Telephone Encounter (Signed)
*  STAT* If patient is at the pharmacy, call can be transferred to refill team.   1. Which medications need to be refilled? (please list name of each medication and dose if known)   ELIQUIS 5 MG TABS tablet   2. Would you like to learn more about the convenience, safety, & potential cost savings by using the [facility]?   3. Are you open to using the [facility] (Type [facility]. ).  4. Which pharmacy/location (including street and city if local pharmacy) is medication to be sent to?  [id number] [place], [place] - [address]   5. Do they need a 30 day or 90 day supply?  30 day  Patient stated he only has 1 tablet left.  Patient has appointment on 08/18/23.

## 2023-06-02 ENCOUNTER — Other Ambulatory Visit: Payer: Self-pay

## 2023-06-02 ENCOUNTER — Ambulatory Visit
Admission: EM | Admit: 2023-06-02 | Discharge: 2023-06-02 | Disposition: A | Discharge status: Home / Self Care | Payer: 59

## 2023-06-02 ENCOUNTER — Emergency Department (HOSPITAL_BASED_OUTPATIENT_CLINIC_OR_DEPARTMENT_OTHER): Payer: 59

## 2023-06-02 ENCOUNTER — Encounter: Payer: Self-pay | Admitting: Emergency Medicine

## 2023-06-02 ENCOUNTER — Telehealth: Payer: Self-pay | Admitting: Cardiology

## 2023-06-02 ENCOUNTER — Encounter (HOSPITAL_BASED_OUTPATIENT_CLINIC_OR_DEPARTMENT_OTHER): Payer: Self-pay

## 2023-06-02 ENCOUNTER — Emergency Department (HOSPITAL_BASED_OUTPATIENT_CLINIC_OR_DEPARTMENT_OTHER)
Admission: EM | Admit: 2023-06-02 | Discharge: 2023-06-03 | Disposition: A | Discharge status: Home / Self Care | Payer: 59 | Attending: Emergency Medicine | Admitting: Emergency Medicine

## 2023-06-02 DIAGNOSIS — I4819 Other persistent atrial fibrillation: Secondary | ICD-10-CM | POA: Insufficient documentation

## 2023-06-02 DIAGNOSIS — K7689 Other specified diseases of liver: Secondary | ICD-10-CM | POA: Diagnosis not present

## 2023-06-02 DIAGNOSIS — R1012 Left upper quadrant pain: Secondary | ICD-10-CM | POA: Diagnosis present

## 2023-06-02 DIAGNOSIS — R918 Other nonspecific abnormal finding of lung field: Secondary | ICD-10-CM | POA: Insufficient documentation

## 2023-06-02 DIAGNOSIS — K760 Fatty (change of) liver, not elsewhere classified: Secondary | ICD-10-CM | POA: Diagnosis not present

## 2023-06-02 DIAGNOSIS — K8020 Calculus of gallbladder without cholecystitis without obstruction: Secondary | ICD-10-CM | POA: Diagnosis not present

## 2023-06-02 LAB — URINALYSIS, ROUTINE W REFLEX MICROSCOPIC
Bilirubin Urine: NEGATIVE
Glucose, UA: NEGATIVE mg/dL
Hgb urine dipstick: NEGATIVE
Leukocytes,Ua: NEGATIVE
Nitrite: NEGATIVE
Specific Gravity, Urine: 1.029 (ref 1.005–1.030)
pH: 5.5 (ref 5.0–8.0)

## 2023-06-02 LAB — COMPREHENSIVE METABOLIC PANEL
ALT: 27 U/L (ref 0–44)
AST: 20 U/L (ref 15–41)
Albumin: 4.1 g/dL (ref 3.5–5.0)
Alkaline Phosphatase: 58 U/L (ref 38–126)
Anion gap: 6 (ref 5–15)
BUN: 13 mg/dL (ref 8–23)
CO2: 30 mmol/L (ref 22–32)
Calcium: 9.8 mg/dL (ref 8.9–10.3)
Chloride: 101 mmol/L (ref 98–111)
Creatinine, Ser: 1.38 mg/dL — ABNORMAL HIGH (ref 0.61–1.24)
GFR, Estimated: 57 mL/min — ABNORMAL LOW (ref >60)
Glucose, Bld: 88 mg/dL (ref 70–99)
Potassium: 4.2 mmol/L (ref 3.5–5.1)
Sodium: 137 mmol/L (ref 135–145)
Total Bilirubin: 0.5 mg/dL (ref <1.2)
Total Protein: 7.9 g/dL (ref 6.5–8.1)

## 2023-06-02 LAB — CBC
HCT: 54.8 % — ABNORMAL HIGH (ref 39.0–52.0)
Hemoglobin: 18.5 g/dL — ABNORMAL HIGH (ref 13.0–17.0)
MCH: 32 pg (ref 26.0–34.0)
MCHC: 33.8 g/dL (ref 30.0–36.0)
MCV: 94.8 fL (ref 80.0–100.0)
Platelets: 187 10*3/uL (ref 150–400)
RBC: 5.78 MIL/uL (ref 4.22–5.81)
RDW: 14 % (ref 11.5–15.5)
WBC: 11 10*3/uL — ABNORMAL HIGH (ref 4.0–10.5)
nRBC: 0 % (ref 0.0–0.2)

## 2023-06-02 LAB — LIPASE, BLOOD: Lipase: 13 U/L (ref 11–51)

## 2023-06-02 MED ORDER — ONDANSETRON HCL 4 MG/2ML IJ SOLN
4.0000 mg | Freq: Once | INTRAMUSCULAR | Status: AC
  Administered 2023-06-02: 4 mg via INTRAVENOUS
  Filled 2023-06-02: qty 2

## 2023-06-02 MED ORDER — SODIUM CHLORIDE 0.9 % IV BOLUS
1000.0000 mL | Freq: Once | INTRAVENOUS | Status: AC
  Administered 2023-06-02: 1000 mL via INTRAVENOUS

## 2023-06-02 MED ORDER — HYDROMORPHONE HCL 1 MG/ML IJ SOLN
0.5000 mg | Freq: Once | INTRAMUSCULAR | Status: AC
  Administered 2023-06-02: 0.5 mg via INTRAVENOUS
  Filled 2023-06-02: qty 1

## 2023-06-02 MED ORDER — IOHEXOL 300 MG/ML  SOLN
100.0000 mL | Freq: Once | INTRAMUSCULAR | Status: AC | PRN
  Administered 2023-06-02: 100 mL via INTRAVENOUS

## 2023-06-02 NOTE — Telephone Encounter (Signed)
STAT if HR is under 50 or over 120 (normal HR is 60-100 beats per minute)  What is your heart rate? 50  Do you have a log of your heart rate readings (document readings)? No   Do you have any other symptoms? Bad pains on left side, back pain left side. Just had ablation on 05/18/23

## 2023-06-02 NOTE — Telephone Encounter (Signed)
Patient called again and stated he is on his way to the emergency room.

## 2023-06-02 NOTE — ED Notes (Signed)
Consulted provider regarding pt symptoms, presentation. Provider reported to discuss treatment options with pt and offer EKG but reports would be sending pt to ED for further evaluation of abdominal pain/distention.   Pt reported " I don't want to have things done here that are going to be redone somewhere else." Pt elected to go to nearest ED prior to obtaining EKG.   Pt denies dizziness. Pt is alert and oriented. Steady gait.   Provider aware of pt decision.

## 2023-06-02 NOTE — ED Triage Notes (Signed)
Pt reports left sided abd pain with swelling that is radiating to left rib cage x2 days. Pt reports nausea, "breaks out in sweat from time to time." Reports loose BM's this am post laxative admin yesterday and this am. Reports hx of umbilical hernia.

## 2023-06-02 NOTE — ED Triage Notes (Signed)
Pt POV reporting LUQ abd pain x3 days that radiates to back, also noting mild distention to area and chills. Last BM this morning, denies urinary sx. Ablation 10/28.

## 2023-06-02 NOTE — Discharge Instructions (Signed)
There was a lung nodule on your CT scan will be need to be followed as an outpatient.  Take hydrocodone as prescribed as needed for pain.  Follow-up with primary doctor if not improving in the next few days.

## 2023-06-02 NOTE — ED Provider Notes (Signed)
[place] EMERGENCY DEPARTMENT AT [facility] Provider Note   CSN: [id number] Arrival date & time: 06/02/23  1536     History  Chief Complaint  Patient presents with   Abdominal Pain    Michael Choi is a 62 y.o. male.   Abdominal Pain Patient with abdominal pain.  The left upper abdomen that goes down to his lower abdomen.  Did have about 2 weeks ago a cardiac ablation for A-fib.  Decreased oral intake.  Nausea.  No black stool.  Had on colchicine but finished that up a few days ago.    Past Medical History:  Diagnosis Date   Atrial flutter [facility])    s/p CTI ablation by Dr Graciela Husbands   Atypical nevus 04/16/2016   left outer back (mild)    Hypertension    Melanoma (HCC)    stage I,  resected.  did not require chemotherapy or radiation   Persistent atrial fibrillation (HCC)     Home Medications Prior to Admission medications   Medication Sig Start Date End Date Taking? Authorizing Provider  Coenzyme Q10 (CO Q 10 PO) Take 1 tablet by mouth 2 (two) times a week.    [provider]  colchicine 0.6 MG tablet Take 1 tablet (0.6 mg total) by mouth 2 (two) times daily for 5 days. 05/18/23 05/23/23  Jeanella Craze, NP  Cyanocobalamin (VITAMIN B-12 PO) Take 1 tablet by mouth every morning.    [provider]  ELIQUIS 5 MG TABS tablet TAKE 1 TABLET BY MOUTH TWICE A DAY 05/22/23   Fenton, Clint R, PA  losartan (COZAAR) 25 MG tablet Take 1 tablet (25 mg total) by mouth daily. 11/28/22   Duke Salvia, MD  MAGNESIUM PO Take 22 mg by mouth every morning. Not sure which one.    [provider]  metoprolol tartrate (LOPRESSOR) 25 MG tablet Take 1.5 tablets (37.5 mg total) by mouth 2 (two) times daily. 04/16/23   Eustace Pen, PA-C  Multiple Vitamin (MULTIVITAMIN ADULT PO) Take 1 tablet by mouth in the morning.    [provider]  omeprazole (PRILOSEC) 40 MG capsule TAKE 1 CAPSULE (40 MG TOTAL) BY MOUTH DAILY. PRILOSEC 40 MG DAILY FOR 8  WEEKS. Patient taking differently: Take 40 mg by mouth daily as needed (acid reflux). 11/11/22   Jenel Lucks, MD  pantoprazole (PROTONIX) 40 MG tablet Take 1 tablet (40 mg total) by mouth daily. 05/18/23   Jeanella Craze, NP  POTASSIUM PO Take 1 tablet by mouth every morning.    [provider]  tadalafil (CIALIS) 5 MG tablet Take 5 mg by mouth daily as needed for erectile dysfunction. 01/02/23   [provider]  testosterone cypionate (DEPOTESTOSTERONE CYPIONATE) 200 MG/ML injection Inject 75 mg into the muscle once a week. 01/07/22   [provider]  triamcinolone cream (KENALOG) 0.1 % Apply 1 Application topically daily as needed (Psoriasis). 01/23/23   [provider]      Allergies    Latex    Review of Systems   Review of Systems  Gastrointestinal:  Positive for abdominal pain.    Physical Exam Updated Vital Signs BP 90/75   Pulse 98   Temp 99.1 F (37.3 C) (Oral)   Resp (!) 23   Ht 6' (1.829 m)   Wt 108.9 kg   SpO2 93%   BMI 32.55 kg/m  Physical Exam Vitals and nursing note reviewed.  Cardiovascular:     Rate and  Rhythm: Normal rate.  Pulmonary:     Effort: Pulmonary effort is normal.  Abdominal:     Tenderness: There is abdominal tenderness.     Comments: Left upper and left lower quadrant tenderness.  No rebound or guarding.  No hernia palpated.  Skin:    Capillary Refill: Capillary refill takes less than 2 seconds.  Neurological:     Mental Status: He is alert and oriented to person, place, and time.     ED Results / Procedures / Treatments   Labs (all labs ordered are listed, but only abnormal results are displayed) Labs Reviewed  COMPREHENSIVE METABOLIC PANEL - Abnormal; Notable for the following components:      Result Value   Creatinine, Ser 1.38 (*)    GFR, Estimated 57 (*)    All other components within normal limits  CBC - Abnormal; Notable for the following components:   WBC 11.0 (*)    Hemoglobin 18.5  (*)    HCT 54.8 (*)    All other components within normal limits  URINALYSIS, ROUTINE W REFLEX MICROSCOPIC - Abnormal; Notable for the following components:   Ketones, ur TRACE (*)    Protein, ur TRACE (*)    All other components within normal limits  LIPASE, BLOOD    EKG EKG Interpretation Date/Time:  Tuesday June 02 2023 15:58:35 EST Ventricular Rate:  68 PR Interval:  152 QRS Duration:  90 QT Interval:  400 QTC Calculation: 425 R Axis:   43  Text Interpretation: Sinus rhythm with Premature atrial complexes Otherwise normal ECG When compared with ECG of 18-May-2023 13:25, PR interval has decreased Nonspecific T wave abnormality now evident in Lateral leads Confirmed by Benjiman Core [phone]) on 06/02/2023 6:12:58 PM  Radiology CT ABDOMEN PELVIS W CONTRAST  Result Date: 06/02/2023 CLINICAL DATA:  Left upper quadrant pain that radiates to the back x3 days. EXAM: CT ABDOMEN AND PELVIS WITH CONTRAST TECHNIQUE: Multidetector CT imaging of the abdomen and pelvis was performed using the standard protocol following bolus administration of intravenous contrast. RADIATION DOSE REDUCTION: This exam was performed according to the departmental dose-optimization program which includes automated exposure control, adjustment of the mA and/or kV according to patient size and/or use of iterative reconstruction technique. CONTRAST:  OMNIPAQUE IOHEXOL 300 MG/ML  SOLN COMPARISON:  None Available. FINDINGS: Lower chest: Ill-defined 2 mm lingular noncalcified lung nodules are noted. Hepatobiliary: 1.7 cm foci of parenchymal low attenuation are seen within the posteromedial aspect of the right lobe of the liver, liver parenchyma anterior to the gallbladder fossa and within the posterior aspect of the liver dome. Additional numerous punctate foci of parenchymal low attenuation are seen scattered throughout the liver. A 2.0 cm gallstone is seen within the lumen of a mildly distended gallbladder. There  is no evidence of biliary dilatation. Pancreas: Unremarkable. No pancreatic ductal dilatation or surrounding inflammatory changes. Spleen: Normal in size without focal abnormality. Adrenals/Urinary Tract: Adrenal glands are unremarkable. Kidneys are normal in size, without renal calculi or hydronephrosis. An 11 mm diameter cyst is seen within the mid left kidney. Bladder is unremarkable. Stomach/Bowel: Stomach is within normal limits. Appendix appears normal. No evidence of bowel dilatation. Diffuse fatty infiltration of a moderately thickened colonic wall is seen throughout the ascending colon. Vascular/Lymphatic: No significant vascular findings are present. No enlarged abdominal or pelvic lymph nodes. Reproductive: Prostate is unremarkable. Other: No abdominal wall hernia or abnormality. No abdominopelvic ascites. Musculoskeletal: No acute or significant osseous findings. IMPRESSION: 1. Cholelithiasis. 2. Multiple hepatic  cysts. 3. Diffuse fatty infiltration of a moderately thickened colonic wall throughout the ascending colon, which may represent the sequelae of prior colitis. 4. Ill-defined 2 mm lingular noncalcified lung nodules. Further evaluation with dedicated nonemergent chest CT is recommended. Electronically Signed   By: Aram Candela M.D.   On: 06/02/2023 22:16    Procedures Procedures    Medications Ordered in ED Medications  sodium chloride 0.9 % bolus 1,000 mL (0 mLs Intravenous Stopped 06/02/23 2014)  ondansetron (ZOFRAN) injection 4 mg (4 mg Intravenous Given 06/02/23 1835)  HYDROmorphone (DILAUDID) injection 0.5 mg (0.5 mg Intravenous Given 06/02/23 1836)  iohexol (OMNIPAQUE) 300 MG/ML solution 100 mL (100 mLs Intravenous Contrast Given 06/02/23 1928)  HYDROmorphone (DILAUDID) injection 0.5 mg (0.5 mg Intravenous Given 06/02/23 2102)    ED Course/ Medical Decision Making/ A&P                                 Medical Decision Making Amount and/or Complexity of Data  Reviewed Labs: ordered. Radiology: ordered.  Risk Prescription drug management.   Patient abdominal pain.  Nausea.  Decreased oral intake.  Did have recent A-fib ablation.  Differential diagnosis includes gastritis, ulcer, pancreatitis.  With amount of tenderness will get CT scan.  Reviewed ablation note.  CT scan showed cholelithiasis.  Blood work reassuring however.  Did have some tender on the right upper quadrant but with more left upper quadrant.  Ultrasound done to further evaluate.  Patient has had some continued pain.  Ultrasound pending but care turned over to Dr. Judd Lien        Final Clinical Impression(s) / ED Diagnoses Final diagnoses:  Left upper quadrant abdominal pain    Rx / DC Orders ED Discharge Orders     None         Benjiman Core, MD 06/02/23 2317

## 2023-06-03 MED ORDER — HYDROMORPHONE HCL 1 MG/ML IJ SOLN
1.0000 mg | Freq: Once | INTRAMUSCULAR | Status: AC
  Administered 2023-06-03: 1 mg via INTRAVENOUS
  Filled 2023-06-03: qty 1

## 2023-06-03 MED ORDER — HYDROCODONE-ACETAMINOPHEN 5-325 MG PO TABS: 2.0000 | ORAL_TABLET | ORAL | 0 refills | Status: DC | PRN

## 2023-06-03 NOTE — ED Provider Notes (Signed)
  Physical Exam  BP 122/88   Pulse 88   Temp 99.1 F (37.3 C) (Oral)   Resp (!) 23   Ht 6' (1.829 m)   Wt 108.9 kg   SpO2 96%   BMI 32.55 kg/m   Physical Exam Vitals and nursing note reviewed.  Constitutional:      Appearance: He is well-developed.  Abdominal:     Tenderness: There is abdominal tenderness in the left upper quadrant.  Neurological:     Mental Status: He is alert and oriented to person, place, and time.     Procedures  Procedures  ED Course / MDM    Medical Decision Making Amount and/or Complexity of Data Reviewed Labs: ordered. Radiology: ordered.  Risk Prescription drug management.      Care assumed from Dr. Rubin Payor at shift change.  Patient awaiting results of an ultrasound.  Patient is having left-sided abdominal pain following an ablation procedure 2 weeks ago.  Ultrasound has resulted and shows no acute intra-abdominal process.  He does have a gallstone, but this seems inconsistent with presentation.  There is no sign of cholecystitis.    Geoffery Lyons, MD 06/03/23 [phone]

## 2023-06-04 ENCOUNTER — Other Ambulatory Visit: Payer: Self-pay

## 2023-06-04 ENCOUNTER — Emergency Department (HOSPITAL_COMMUNITY): Payer: 59

## 2023-06-04 ENCOUNTER — Encounter (HOSPITAL_COMMUNITY): Payer: Self-pay | Admitting: Emergency Medicine

## 2023-06-04 ENCOUNTER — Observation Stay (HOSPITAL_COMMUNITY)
Admission: EM | Admit: 2023-06-04 | Discharge: 2023-06-05 | Disposition: A | Discharge status: Home / Self Care | Payer: 59 | Attending: Internal Medicine | Admitting: Internal Medicine

## 2023-06-04 DIAGNOSIS — K529 Noninfective gastroenteritis and colitis, unspecified: Secondary | ICD-10-CM | POA: Diagnosis not present

## 2023-06-04 DIAGNOSIS — K625 Hemorrhage of anus and rectum: Secondary | ICD-10-CM | POA: Diagnosis not present

## 2023-06-04 DIAGNOSIS — K922 Gastrointestinal hemorrhage, unspecified: Secondary | ICD-10-CM | POA: Diagnosis not present

## 2023-06-04 DIAGNOSIS — Z79899 Other long term (current) drug therapy: Secondary | ICD-10-CM | POA: Insufficient documentation

## 2023-06-04 DIAGNOSIS — I480 Paroxysmal atrial fibrillation: Secondary | ICD-10-CM | POA: Diagnosis present

## 2023-06-04 DIAGNOSIS — Z7901 Long term (current) use of anticoagulants: Secondary | ICD-10-CM | POA: Insufficient documentation

## 2023-06-04 DIAGNOSIS — Z85828 Personal history of other malignant neoplasm of skin: Secondary | ICD-10-CM | POA: Diagnosis not present

## 2023-06-04 DIAGNOSIS — N289 Disorder of kidney and ureter, unspecified: Secondary | ICD-10-CM | POA: Diagnosis not present

## 2023-06-04 DIAGNOSIS — E669 Obesity, unspecified: Secondary | ICD-10-CM | POA: Insufficient documentation

## 2023-06-04 DIAGNOSIS — I110 Hypertensive heart disease with heart failure: Secondary | ICD-10-CM | POA: Insufficient documentation

## 2023-06-04 DIAGNOSIS — N2889 Other specified disorders of kidney and ureter: Secondary | ICD-10-CM | POA: Insufficient documentation

## 2023-06-04 DIAGNOSIS — I5022 Chronic systolic (congestive) heart failure: Secondary | ICD-10-CM

## 2023-06-04 DIAGNOSIS — K8020 Calculus of gallbladder without cholecystitis without obstruction: Secondary | ICD-10-CM | POA: Diagnosis present

## 2023-06-04 DIAGNOSIS — Z91040 Latex allergy status: Secondary | ICD-10-CM | POA: Diagnosis not present

## 2023-06-04 DIAGNOSIS — K429 Umbilical hernia without obstruction or gangrene: Secondary | ICD-10-CM | POA: Diagnosis not present

## 2023-06-04 DIAGNOSIS — I10 Essential (primary) hypertension: Secondary | ICD-10-CM | POA: Diagnosis present

## 2023-06-04 DIAGNOSIS — Z6832 Body mass index (BMI) 32.0-32.9, adult: Secondary | ICD-10-CM | POA: Diagnosis not present

## 2023-06-04 DIAGNOSIS — E291 Testicular hypofunction: Secondary | ICD-10-CM | POA: Diagnosis present

## 2023-06-04 LAB — COMPREHENSIVE METABOLIC PANEL
ALT: 25 U/L (ref 0–44)
AST: 25 U/L (ref 15–41)
Albumin: 3.2 g/dL — ABNORMAL LOW (ref 3.5–5.0)
Alkaline Phosphatase: 48 U/L (ref 38–126)
Anion gap: 8 (ref 5–15)
BUN: 15 mg/dL (ref 8–23)
CO2: 24 mmol/L (ref 22–32)
Calcium: 8.9 mg/dL (ref 8.9–10.3)
Chloride: 101 mmol/L (ref 98–111)
Creatinine, Ser: 1.43 mg/dL — ABNORMAL HIGH (ref 0.61–1.24)
GFR, Estimated: 55 mL/min — ABNORMAL LOW (ref >60)
Glucose, Bld: 98 mg/dL (ref 70–99)
Potassium: 3.8 mmol/L (ref 3.5–5.1)
Sodium: 133 mmol/L — ABNORMAL LOW (ref 135–145)
Total Bilirubin: 0.6 mg/dL (ref <1.2)
Total Protein: 6.7 g/dL (ref 6.5–8.1)

## 2023-06-04 LAB — CBC WITH DIFFERENTIAL/PLATELET
Abs Immature Granulocytes: 0.02 10*3/uL (ref 0.00–0.07)
Basophils Absolute: 0 10*3/uL (ref 0.0–0.1)
Basophils Relative: 0 %
Eosinophils Absolute: 0.1 10*3/uL (ref 0.0–0.5)
Eosinophils Relative: 1 %
HCT: 52.3 % — ABNORMAL HIGH (ref 39.0–52.0)
Hemoglobin: 17 g/dL (ref 13.0–17.0)
Immature Granulocytes: 0 %
Lymphocytes Relative: 17 %
Lymphs Abs: 1.3 10*3/uL (ref 0.7–4.0)
MCH: 30.8 pg (ref 26.0–34.0)
MCHC: 32.5 g/dL (ref 30.0–36.0)
MCV: 94.7 fL (ref 80.0–100.0)
Monocytes Absolute: 1.2 10*3/uL — ABNORMAL HIGH (ref 0.1–1.0)
Monocytes Relative: 16 %
Neutro Abs: 4.8 10*3/uL (ref 1.7–7.7)
Neutrophils Relative %: 66 %
Platelets: 188 10*3/uL (ref 150–400)
RBC: 5.52 MIL/uL (ref 4.22–5.81)
RDW: 13.7 % (ref 11.5–15.5)
WBC: 7.3 10*3/uL (ref 4.0–10.5)
nRBC: 0 % (ref 0.0–0.2)

## 2023-06-04 LAB — HEMOGLOBIN AND HEMATOCRIT, BLOOD
HCT: 50.7 % (ref 39.0–52.0)
HCT: 49.8 % (ref 39.0–52.0)
HCT: 48.8 % (ref 39.0–52.0)
Hemoglobin: 16.5 g/dL (ref 13.0–17.0)
Hemoglobin: 16.9 g/dL (ref 13.0–17.0)
Hemoglobin: 16.3 g/dL (ref 13.0–17.0)

## 2023-06-04 LAB — C DIFFICILE QUICK SCREEN W PCR REFLEX
C Diff antigen: NEGATIVE
C Diff interpretation: NOT DETECTED
C Diff toxin: NEGATIVE

## 2023-06-04 LAB — PROTIME-INR
INR: 1.3 — ABNORMAL HIGH (ref 0.8–1.2)
Prothrombin Time: 16.5 s — ABNORMAL HIGH (ref 11.4–15.2)

## 2023-06-04 LAB — HIV ANTIBODY (ROUTINE TESTING W REFLEX): HIV Screen 4th Generation wRfx: NONREACTIVE

## 2023-06-04 LAB — LIPASE, BLOOD: Lipase: 25 U/L (ref 11–51)

## 2023-06-04 LAB — POC OCCULT BLOOD, ED: Fecal Occult Bld: POSITIVE — AB

## 2023-06-04 MED ORDER — OXYCODONE-ACETAMINOPHEN 5-325 MG PO TABS
1.0000 | ORAL_TABLET | Freq: Four times a day (QID) | ORAL | Status: DC | PRN
  Administered 2023-06-04 – 2023-06-05 (×3): 1 via ORAL
  Filled 2023-06-04 (×3): qty 1

## 2023-06-04 MED ORDER — IOHEXOL 350 MG/ML SOLN
100.0000 mL | Freq: Once | INTRAVENOUS | Status: AC | PRN
  Administered 2023-06-04: 100 mL via INTRAVENOUS

## 2023-06-04 MED ORDER — PANTOPRAZOLE SODIUM 40 MG PO TBEC
40.0000 mg | DELAYED_RELEASE_TABLET | Freq: Every day | ORAL | Status: DC
  Administered 2023-06-04 – 2023-06-05 (×2): 40 mg via ORAL
  Filled 2023-06-04 (×2): qty 1

## 2023-06-04 MED ORDER — ONDANSETRON HCL 4 MG PO TABS: 4.0000 mg | ORAL_TABLET | Freq: Four times a day (QID) | ORAL | Status: DC | PRN

## 2023-06-04 MED ORDER — ACETAMINOPHEN 325 MG PO TABS: 650.0000 mg | ORAL_TABLET | Freq: Four times a day (QID) | ORAL | Status: DC | PRN

## 2023-06-04 MED ORDER — METRONIDAZOLE 500 MG/100ML IV SOLN
500.0000 mg | Freq: Two times a day (BID) | INTRAVENOUS | Status: DC
  Administered 2023-06-04 – 2023-06-05 (×3): 500 mg via INTRAVENOUS
  Filled 2023-06-04 (×3): qty 100

## 2023-06-04 MED ORDER — METOPROLOL TARTRATE 25 MG PO TABS
37.5000 mg | ORAL_TABLET | Freq: Two times a day (BID) | ORAL | Status: DC
  Administered 2023-06-04 – 2023-06-05 (×2): 37.5 mg via ORAL
  Filled 2023-06-04 (×2): qty 1

## 2023-06-04 MED ORDER — ACETAMINOPHEN 650 MG RE SUPP: 650.0000 mg | Freq: Four times a day (QID) | RECTAL | Status: DC | PRN

## 2023-06-04 MED ORDER — OXYCODONE-ACETAMINOPHEN 5-325 MG PO TABS
1.0000 | ORAL_TABLET | Freq: Once | ORAL | Status: AC
  Administered 2023-06-04: 1 via ORAL
  Filled 2023-06-04: qty 1

## 2023-06-04 MED ORDER — SODIUM CHLORIDE 0.9% FLUSH
3.0000 mL | Freq: Two times a day (BID) | INTRAVENOUS | Status: DC
  Administered 2023-06-04 – 2023-06-05 (×3): 3 mL via INTRAVENOUS

## 2023-06-04 MED ORDER — LOSARTAN POTASSIUM 25 MG PO TABS
25.0000 mg | ORAL_TABLET | Freq: Every day | ORAL | Status: DC
  Administered 2023-06-05: 25 mg via ORAL
  Filled 2023-06-04: qty 1

## 2023-06-04 MED ORDER — SODIUM CHLORIDE 0.9 % IV SOLN
2.0000 g | Freq: Every day | INTRAVENOUS | Status: DC
  Administered 2023-06-04 – 2023-06-05 (×2): 2 g via INTRAVENOUS
  Filled 2023-06-04 (×2): qty 20

## 2023-06-04 MED ORDER — ONDANSETRON HCL 4 MG/2ML IJ SOLN: 4.0000 mg | Freq: Four times a day (QID) | INTRAMUSCULAR | Status: DC | PRN

## 2023-06-04 MED ORDER — ALBUTEROL SULFATE (2.5 MG/3ML) 0.083% IN NEBU: 2.5000 mg | INHALATION_SOLUTION | Freq: Four times a day (QID) | RESPIRATORY_TRACT | Status: DC | PRN

## 2023-06-04 NOTE — Consult Note (Signed)
Consultation Note   Referring Provider:  [facility] PCP: [facility] Primary Gastroenterologist: Tiajuana Amass, MD        Reason for Consultation: Abdominal pain and rectal bleeding  DOA: 06/04/2023         Hospital Day: 1   ASSESSMENT    Brief Narrative:  62 y.o. year old male with chronic constipation, colon polyps, GERD, atrial fibrillation on Eliquis, ? Hemochromatosis, cholelithiasis  Acute generalized left sided abdominal pain Loose to mushy stools Rectal bleeding (more than his usual hemorrhoid bleeding) Fever at home Abnormal CT scan suggesting right sided colitis.  CT angio negative for extravasation of blood into the lumen but does demonstrate subtle pericolonic inflammation  and concentric intramural fat infiltration involving cecum and ascending colon. Rule out infectious colitis. IBD seems less likely  Elevated creatinine.  Baseline creatinine 1.2>> 1.43 today.   Chronic constipation / hemorrhoids with chronic intermittent rectal bleeding  Atrial fibrillation, on Eliquis ( last dose this am). Recent ablation  Mild bradycardia.  Reportedly beta-blocker dose recently increased  Reported history of hemochromatosis.  Hemoglobin generally runs slightly elevated around 18.5.  Currently at 17.3  History of colon polyps.   Small adenomatous polyps removed March 2024  Asymptomatic cholelithiasis  Principal Problem:   Colitis  PLAN:   --Hemodynamically stable.  Hemoglobin normal.  Agree with observation stay --Will order GI path panel, C-diff ( recent antibiotic use), fecal calprotectin --Antibiotics already ordered ( Rocephin + Flagyl) --Regular diet ordered --If symptoms persist and stool studies negative then will consider repeat colonoscopy to make sure he hasn't developed inflammatory bowel disease since last colonoscopy -- Further workup for reported history of hemochromatosis can  be done as outpatient  HPI   Michael Choi was evaluated in our office March 2024 for constipation, abdominal pain and rectal bleeding as well as GERD symptoms.  He subsequently underwent an EGD and colonoscopy with findings of hemorrhoids, some adenomatous colon polyps and mild reflux esophagitis.   Patient was seen in the ED a couple days ago for left-sided abdominal pain radiating from mid abdomen up into left upper quadrant.  RUQ ultrasound showed cholelithiasis but no evidence for acute cholecystitis.  Hepatic steatosis and a small simple hepatic cyst.  CT scan the abdomen pelvis contrast showed diffuse fatty infiltration of a moderately thickened colonic wall throughout the ascending colon.  His white count was 11.  Patient discharged from the emergency department but returned this a.m. with worsening left sided abdominal pain.   The left-sided abdominal pain started a few days ago.  Patient has chronic constipation but recently his stools have been more mushy and also has had some frank diarrhea.  It is noted that he has recently taken a laxative but stools or of mushy consistency before the laxative.  He has chronic intermittent rectal bleeding attributed to hemorrhoids.  Over the last few days he has had an increased volume of blood in stool.  He reports dark stools with dark red blood.  He has had chills at home.  Reports temperatures around 100 at home.   Notable labs / Imaging / Events this admission  :   WBC 7.3, hemoglobin 17, MCV 94, platelets 188 Sodium 133, creatinine 1.43, baseline 1.2  CT angio IMPRESSION: 1. No evidence of active contrast extravasation into the gastrointestinal tract. 2. Compared to the prior study, the cecum and ascending colon now demonstrate some subtle pericolonic inflammation as well as the previously identified concentric intramural fat infiltration. Findings are suggestive of an acute on chronic inflammatory process such as inflammatory bowel disease with  suggestion of active colitis. This is likely the source of gastrointestinal bleeding. 3. Stable cholelithiasis without evidence of acute cholecystitis or biliary obstruction. 4. Stable scarring and nodularity within the inferior subpleural lingula likely reflecting prior inflammatory process.  Previous GI Evaluations   March 2024 colonoscopy for constipation and rectal bleeding --Bowel prep was adequate - Hemorrhoids found on perianal exam. - Three 2 to 4 mm polyps in the transverse colon, removed with a cold snare. Resected and retrieved. - One 4 mm polyp in the descending colon, removed with a cold snare. Resected and retrieved. - The distal rectum and anal verge are normal on retroflexion view.  March 2024 EGD for dysphagia and suspected GERD - The examined portions of the nasopharynx, oropharynx and larynx were normal. - LA Grade A reflux esophagitis with no bleeding. Dilated. - Gastritis. Biopsied. Normal examined duodenum. - Biopsies were taken with a cold forceps for evaluation of eosinophilic esophagitis.   Diagnosis 1. Surgical [P], gastric antrum and gastric body GASTRIC ANTRAL / OXYNTIC MUCOSA WITHOUT SIGNIFICANT DIAGNOSTIC ALTERATION. NO H. PYLORI IDENTIFIED ON H&E. 2. Surgical [P], distal esophagus ESOPHAGEAL SQUAMOUS MUCOSA WITHOUT SIGNIFICANT DIAGNOSTIC ALTERATION. NO EVIDENCE OF LYMPHOCYTES AND EOSINOPHILS. 3. Surgical [P], proximal esophagus ESOPHAGEAL SQUAMOUS MUCOSA WITHOUT SIGNIFICANT DIAGNOSTIC ALTERATION. NO EVIDENCE OF LYMPHOCYTES AND EOSINOPHILS. 4. Surgical [P], colon, transverse polyp, polyp (3) TUBULAR ADENOMA. HYPERPLASTIC POLYP. NEGATIVE FOR HIGH-GRADE DYSPLASIA. 5. Surgical [P], colon, descending, polyp (1) TUBULAR ADENOMA. NEGATIVE FOR HIGH-GRADE DYSPLASIA.  -   Labs and Imaging: Recent Labs    06/02/23 1554 06/04/23 0741  WBC 11.0* 7.3  HGB 18.5* 17.0  HCT 54.8* 52.3*  PLT 187 188   Recent Labs    06/02/23 1554 06/04/23 0741  NA 137  133*  K 4.2 3.8  CL 101 101  CO2 30 24  GLUCOSE 88 98  BUN 13 15  CREATININE 1.38* 1.43*  CALCIUM 9.8 8.9   Recent Labs    06/04/23 0741  PROT 6.7  ALBUMIN 3.2*  AST 25  ALT 25  ALKPHOS 48  BILITOT 0.6   No results for input(s): "HEPBSAG", "HCVAB", "HEPAIGM", "HEPBIGM" in the last 72 hours. Recent Labs    06/04/23 0741  LABPROT 16.5*  INR 1.3*     Past Medical History:  Diagnosis Date   Atrial flutter [facility])    s/p CTI ablation by Dr Graciela Husbands   Atypical nevus 04/16/2016   left outer back (mild)    Hypertension    Melanoma (HCC)    stage I,  resected.  did not require chemotherapy or radiation   Persistent atrial fibrillation [facility])     Past Surgical History:  Procedure Laterality Date   A-FLUTTER ABLATION N/A 03/05/2018   Procedure: A-FLUTTER ABLATION;  Surgeon: Duke Salvia, MD;  Location: [facility] INVASIVE CV LAB;  Service: Cardiovascular;  Laterality: N/A;   ATRIAL FIBRILLATION ABLATION N/A 08/23/2020   Procedure: ATRIAL FIBRILLATION ABLATION;  Surgeon: Hillis Range, MD;  Location: [facility] INVASIVE CV LAB;  Service: Cardiovascular;  Laterality: N/A;   ATRIAL FIBRILLATION ABLATION N/A 05/18/2023   Procedure: ATRIAL FIBRILLATION ABLATION;  Surgeon: Lanier Prude, MD;  Location: [facility] INVASIVE CV LAB;  Service: Cardiovascular;  Laterality: N/A;   CARDIOVERSION N/A 11/27/2017   Procedure: CARDIOVERSION;  Surgeon: Elder Negus, MD;  Location: [facility] ENDOSCOPY;  Service: Cardiovascular;  Laterality: N/A;   CARDIOVERSION N/A 03/18/2019   Procedure: CARDIOVERSION;  Surgeon: Lewayne Bunting, MD;  Location: [facility] ENDOSCOPY;  Service: Cardiovascular;  Laterality: N/A;   CARDIOVERSION N/A 07/18/2020   Procedure: CARDIOVERSION;  Surgeon: Jake Bathe, MD;  Location: [facility] ENDOSCOPY;  Service: Cardiovascular;  Laterality: N/A;   CARDIOVERSION N/A 01/18/2021   Procedure: CARDIOVERSION;  Surgeon: Sande Rives, MD;  Location: [facility] ENDOSCOPY;  Service: Cardiovascular;  Laterality:  N/A;   KNEE ARTHROSCOPY     TEE WITHOUT CARDIOVERSION N/A 11/27/2017   Procedure: TRANSESOPHAGEAL ECHOCARDIOGRAM (TEE);  Surgeon: Elder Negus, MD;  Location: [facility] ENDOSCOPY;  Service: Cardiovascular;  Laterality: N/A;   TEE WITHOUT CARDIOVERSION N/A 03/18/2019   Procedure: TRANSESOPHAGEAL ECHOCARDIOGRAM (TEE);  Surgeon: Lewayne Bunting, MD;  Location: [facility] ENDOSCOPY;  Service: Cardiovascular;  Laterality: N/A;    Family History  Problem Relation Age of Onset   Cancer Mother    Heart disease Father        rheumatic heart disease s/p transplant   Hypertension Brother    Colon cancer Neg Hx    Rectal cancer Neg Hx    Stomach cancer Neg Hx     Prior to Admission medications   Medication Sig Start Date End Date Taking? Authorizing Provider  acetaminophen (TYLENOL) 500 MG tablet Take 1,000 mg by mouth every 6 (six) hours as needed for moderate pain (pain score 4-6).   Yes [provider]  Coenzyme Q10 (CO Q 10 PO) Take 1 tablet by mouth 2 (two) times a week.   Yes [provider]  Cyanocobalamin (VITAMIN B-12 PO) Take 1 tablet by mouth every morning.   Yes [provider]  ELIQUIS 5 MG TABS tablet TAKE 1 TABLET BY MOUTH TWICE A DAY 05/22/23  Yes Fenton, Clint R, PA  HYDROcodone-acetaminophen (NORCO) 5-325 MG tablet Take 2 tablets by mouth every 4 (four) hours as needed. 06/03/23  Yes Delo, Riley Lam, MD  ibuprofen (ADVIL) 200 MG tablet Take 600 mg by mouth every 6 (six) hours as needed for moderate pain (pain score 4-6).   Yes [provider]  losartan (COZAAR) 25 MG tablet Take 1 tablet (25 mg total) by mouth daily. 11/28/22  Yes Duke Salvia, MD  MAGNESIUM-ZINC PO Take 1 tablet by mouth daily.   Yes [provider]  metoprolol tartrate (LOPRESSOR) 25 MG tablet Take 1.5 tablets (37.5 mg total) by mouth 2 (two) times daily. 04/16/23  Yes Eustace Pen, PA-C  Multiple Vitamin (MULTIVITAMIN ADULT PO) Take 1 tablet by mouth in the morning.   Yes  [provider]  pantoprazole (PROTONIX) 40 MG tablet Take 1 tablet (40 mg total) by mouth daily. 05/18/23  Yes Ollis, Brandi L, NP  POTASSIUM PO Take 1 tablet by mouth every morning.   Yes [provider]  tadalafil (CIALIS) 5 MG tablet Take 5 mg by mouth daily as needed for erectile dysfunction. 01/02/23  Yes [provider]  testosterone cypionate (DEPOTESTOSTERONE CYPIONATE) 200 MG/ML injection Inject 75 mg into the muscle once a week. Saturday or Sunday 01/07/22  Yes [provider]  triamcinolone cream (KENALOG) 0.1 % Apply 1 Application topically daily as needed (Psoriasis). 01/23/23  Yes [provider]  colchicine 0.6 MG tablet Take 1 tablet (0.6 mg total) by mouth 2 (two) times daily for 5  days. Patient not taking: Reported on 06/04/2023 05/18/23 05/23/23  Jeanella Craze, NP  flecainide (TAMBOCOR) 100 MG tablet Take 100 mg by mouth 2 (two) times daily. Patient not taking: Reported on 06/04/2023 05/25/23   [provider]  flecainide (TAMBOCOR) 50 MG tablet Take 50 mg by mouth 2 (two) times daily. Patient not taking: Reported on 06/04/2023 05/25/23   [provider]  omeprazole (PRILOSEC) 40 MG capsule TAKE 1 CAPSULE (40 MG TOTAL) BY MOUTH DAILY. PRILOSEC 40 MG DAILY FOR 8 WEEKS. Patient not taking: Reported on 06/04/2023 11/11/22   Jenel Lucks, MD    No current facility-administered medications for this encounter.   Current Outpatient Medications  Medication Sig Dispense Refill   acetaminophen (TYLENOL) 500 MG tablet Take 1,000 mg by mouth every 6 (six) hours as needed for moderate pain (pain score 4-6).     Coenzyme Q10 (CO Q 10 PO) Take 1 tablet by mouth 2 (two) times a week.     Cyanocobalamin (VITAMIN B-12 PO) Take 1 tablet by mouth every morning.     ELIQUIS 5 MG TABS tablet TAKE 1 TABLET BY MOUTH TWICE A DAY 60 tablet 5   HYDROcodone-acetaminophen (NORCO) 5-325 MG tablet Take 2 tablets by mouth every 4 (four)  hours as needed. 10 tablet 0   ibuprofen (ADVIL) 200 MG tablet Take 600 mg by mouth every 6 (six) hours as needed for moderate pain (pain score 4-6).     losartan (COZAAR) 25 MG tablet Take 1 tablet (25 mg total) by mouth daily. 90 tablet 3   MAGNESIUM-ZINC PO Take 1 tablet by mouth daily.     metoprolol tartrate (LOPRESSOR) 25 MG tablet Take 1.5 tablets (37.5 mg total) by mouth 2 (two) times daily. 270 tablet 3   Multiple Vitamin (MULTIVITAMIN ADULT PO) Take 1 tablet by mouth in the morning.     pantoprazole (PROTONIX) 40 MG tablet Take 1 tablet (40 mg total) by mouth daily. 45 tablet 0   POTASSIUM PO Take 1 tablet by mouth every morning.     tadalafil (CIALIS) 5 MG tablet Take 5 mg by mouth daily as needed for erectile dysfunction.     testosterone cypionate (DEPOTESTOSTERONE CYPIONATE) 200 MG/ML injection Inject 75 mg into the muscle once a week. Saturday or Sunday     triamcinolone cream (KENALOG) 0.1 % Apply 1 Application topically daily as needed (Psoriasis).     colchicine 0.6 MG tablet Take 1 tablet (0.6 mg total) by mouth 2 (two) times daily for 5 days. (Patient not taking: Reported on 06/04/2023) 10 tablet 0   flecainide (TAMBOCOR) 100 MG tablet Take 100 mg by mouth 2 (two) times daily. (Patient not taking: Reported on 06/04/2023)     flecainide (TAMBOCOR) 50 MG tablet Take 50 mg by mouth 2 (two) times daily. (Patient not taking: Reported on 06/04/2023)     omeprazole (PRILOSEC) 40 MG capsule TAKE 1 CAPSULE (40 MG TOTAL) BY MOUTH DAILY. PRILOSEC 40 MG DAILY FOR 8 WEEKS. (Patient not taking: Reported on 06/04/2023) 90 capsule 1    Allergies as of 06/04/2023 - Review Complete 06/04/2023  Allergen Reaction Noted   Latex Itching 10/05/2019    Social History   Socioeconomic History   Marital status: Single    Spouse name: Not on file   Number of children: 0   Years of education: Not on file   Highest education level: Not on file  Occupational History   Occupation: court house  employee  Tobacco Use  Smoking status: Never   Smokeless tobacco: Never  Vaping Use   Vaping status: Never Used  Substance and Sexual Activity   Alcohol use: Yes    Alcohol/week: 1.0 standard drink of alcohol    Types: 1 Standard drinks or equivalent per week    Comment: twice a year   Drug use: No   Sexual activity: Not on file  Other Topics Concern   Not on file  Social History Narrative   Lives in [place] with fiance   Works with department of public safety   Social Determinants of Health   Financial Resource Strain: Not on file  Food Insecurity: No Food Insecurity (05/29/2022)   Received from [facility]   Hunger Vital Sign    Worried About Running Out of Food in the Last Year: Never true    Ran Out of Food in the Last Year: Never true  Transportation Needs: Not on file  Physical Activity: Not on file  Stress: Not on file  Social Connections: Unknown (11/20/2021)   Received from [facility], [facility]   Social Network    Social Network: Not on file  Intimate Partner Violence: Unknown (10/21/2021)   Received from [facility], [facility]   HITS    Physically Hurt: Not on file    Insult or Talk Down To: Not on file    Threaten Physical Harm: Not on file    Scream or Curse: Not on file     Code Status   Code Status: Prior  Review of Systems: All systems reviewed and negative except where noted in HPI.  Physical Exam: Vital signs in last 24 hours: Temp:  [97.7 F (36.5 C)] 97.7 F (36.5 C) (11/14 0707) Pulse Rate:  [42-49] 42 (11/14 0800) Resp:  [16-18] 18 (11/14 0800) BP: (113-137)/(79-97) 113/92 (11/14 0800) SpO2:  [96 %-99 %] 96 % (11/14 0800)    General:  Pleasant male in NAD Psych:  Cooperative. Normal mood and affect Eyes: Pupils equal Ears:  Normal auditory acuity Nose: No deformity, discharge or lesions Neck:  Supple, no masses felt Lungs:  Clear to auscultation.  Heart:  Regular rate, regular rhythm.  Abdomen:   Soft, nondistended, nontender, active bowel sounds, no masses felt Rectal :  Deferred Msk: Symmetrical without gross deformities.  Neurologic:  Alert, oriented, grossly normal neurologically Extremities : No edema Skin:  Intact without significant lesions.    Intake/Output from previous day: No intake/output data recorded. Intake/Output this shift:  No intake/output data recorded.   Willette Cluster, NP-C   06/04/2023, 11:04 AM

## 2023-06-04 NOTE — ED Provider Notes (Signed)
[place] EMERGENCY DEPARTMENT AT [facility] Provider Note   CSN: [id number] Arrival date & time: 06/04/23  4098     History  Chief Complaint  Patient presents with   GI Bleeding    Michael Choi is a 62 y.o. male.  62 year old male with past medical history of atrial fibrillation on Eliquis as well as hemochromatosis presenting to the emergency department today with concern for persistent abdominal pain or rectal bleeding.  The patient states that he was seen here few days ago for abdominal pain.  He states that his abdominal pain is diffuse.  He states that this has persisted.  He was having pain this morning and he states that this is now around his umbilicus.  He states that he is also noticed some blood in his stool.  He had a bowel movement this morning and states that it was frankly bloody.  He reports that he is on Eliquis.  He states he has been having dark stools but there is more blood this time than normal.  The patient states that his abdominal pain has worsened since he was here 2 days ago.  He states that it is mostly periumbilical now.        Home Medications Prior to Admission medications   Medication Sig Start Date End Date Taking? Authorizing Provider  acetaminophen (TYLENOL) 500 MG tablet Take 1,000 mg by mouth every 6 (six) hours as needed for moderate pain (pain score 4-6).   Yes [provider]  Coenzyme Q10 (CO Q 10 PO) Take 1 tablet by mouth 2 (two) times a week.   Yes [provider]  Cyanocobalamin (VITAMIN B-12 PO) Take 1 tablet by mouth every morning.   Yes [provider]  ELIQUIS 5 MG TABS tablet TAKE 1 TABLET BY MOUTH TWICE A DAY 05/22/23  Yes Fenton, Clint R, PA  HYDROcodone-acetaminophen (NORCO) 5-325 MG tablet Take 2 tablets by mouth every 4 (four) hours as needed. 06/03/23  Yes Delo, Riley Lam, MD  ibuprofen (ADVIL) 200 MG tablet Take 600 mg by mouth every 6 (six) hours as needed for moderate pain (pain score 4-6).    Yes [provider]  losartan (COZAAR) 25 MG tablet Take 1 tablet (25 mg total) by mouth daily. 11/28/22  Yes Duke Salvia, MD  MAGNESIUM-ZINC PO Take 1 tablet by mouth daily.   Yes [provider]  metoprolol tartrate (LOPRESSOR) 25 MG tablet Take 1.5 tablets (37.5 mg total) by mouth 2 (two) times daily. 04/16/23  Yes Eustace Pen, PA-C  Multiple Vitamin (MULTIVITAMIN ADULT PO) Take 1 tablet by mouth in the morning.   Yes [provider]  pantoprazole (PROTONIX) 40 MG tablet Take 1 tablet (40 mg total) by mouth daily. 05/18/23  Yes Ollis, Brandi L, NP  POTASSIUM PO Take 1 tablet by mouth every morning.   Yes [provider]  tadalafil (CIALIS) 5 MG tablet Take 5 mg by mouth daily as needed for erectile dysfunction. 01/02/23  Yes [provider]  testosterone cypionate (DEPOTESTOSTERONE CYPIONATE) 200 MG/ML injection Inject 75 mg into the muscle once a week. Saturday or Sunday 01/07/22  Yes [provider]  triamcinolone cream (KENALOG) 0.1 % Apply 1 Application topically daily as needed (Psoriasis). 01/23/23  Yes [provider]  colchicine 0.6 MG tablet Take 1 tablet (0.6 mg total) by mouth 2 (two) times daily for 5 days. Patient not taking: Reported on 06/04/2023 05/18/23 05/23/23  Jeanella Craze, NP  flecainide (  TAMBOCOR) 100 MG tablet Take 100 mg by mouth 2 (two) times daily. Patient not taking: Reported on 06/04/2023 05/25/23   [provider]  flecainide (TAMBOCOR) 50 MG tablet Take 50 mg by mouth 2 (two) times daily. Patient not taking: Reported on 06/04/2023 05/25/23   [provider]  omeprazole (PRILOSEC) 40 MG capsule TAKE 1 CAPSULE (40 MG TOTAL) BY MOUTH DAILY. PRILOSEC 40 MG DAILY FOR 8 WEEKS. Patient not taking: Reported on 06/04/2023 11/11/22   Jenel Lucks, MD      Allergies    Latex    Review of Systems   Review of Systems  Gastrointestinal:  Positive for abdominal pain and blood in  stool.  All other systems reviewed and are negative.   Physical Exam Updated Vital Signs BP (!) 113/92   Pulse (!) 42   Temp 97.7 F (36.5 C) (Oral)   Resp 18   SpO2 96%  Physical Exam Vitals and nursing note reviewed.   Gen: NAD Eyes: PERRL, EOMI HEENT: no oropharyngeal swelling Neck: trachea midline Resp: clear to auscultation bilaterally Card: RRR, no murmurs, rubs, or gallops Abd: Mildly distended, diffusely tender with no guarding or rebound, maximal tenderness over the periumbilical region Rectal: Chaperoned by male nursing staff shows external hemorrhoids that are nonbleeding rectal vault is mostly empty, occult card sent to lab Extremities: no calf tenderness, no edema Vascular: 2+ radial pulses bilaterally, 2+ DP pulses bilaterally Skin: no rashes Psyc: acting appropriately   ED Results / Procedures / Treatments   Labs (all labs ordered are listed, but only abnormal results are displayed) Labs Reviewed  CBC WITH DIFFERENTIAL/PLATELET - Abnormal; Notable for the following components:      Result Value   HCT 52.3 (*)    Monocytes Absolute 1.2 (*)    All other components within normal limits  COMPREHENSIVE METABOLIC PANEL - Abnormal; Notable for the following components:   Sodium 133 (*)    Creatinine, Ser 1.43 (*)    Albumin 3.2 (*)    GFR, Estimated 55 (*)    All other components within normal limits  PROTIME-INR - Abnormal; Notable for the following components:   Prothrombin Time 16.5 (*)    INR 1.3 (*)    All other components within normal limits  POC OCCULT BLOOD, ED - Abnormal; Notable for the following components:   Fecal Occult Bld POSITIVE (*)    All other components within normal limits  LIPASE, BLOOD  OCCULT BLOOD X 1 CARD TO LAB, STOOL  TYPE AND SCREEN    EKG None  Radiology CT ANGIO GI BLEED  Result Date: 06/04/2023 CLINICAL DATA:  Abdominal pain and rectal bleeding. EXAM: CTA ABDOMEN AND PELVIS WITHOUT AND WITH CONTRAST TECHNIQUE:  Multidetector CT imaging of the abdomen and pelvis was performed using the standard protocol during bolus administration of intravenous contrast. Multiplanar reconstructed images and MIPs were obtained and reviewed to evaluate the vascular anatomy. RADIATION DOSE REDUCTION: This exam was performed according to the departmental dose-optimization program which includes automated exposure control, adjustment of the mA and/or kV according to patient size and/or use of iterative reconstruction technique. CONTRAST:  OMNIPAQUE IOHEXOL 350 MG/ML SOLN COMPARISON:  CT of the abdomen and pelvis on 06/02/2023. FINDINGS: VASCULAR Aorta: Normally patent and without significant atherosclerosis. No aneurysm or dissection. Celiac: Normally patent. Normally patent branches and branch vessel anatomy. SMA: Normally patent. Renals: Normally patent bilateral single renal arteries. IMA: Normally patent. Inflow: Normally patent bilateral iliac arteries. Proximal Outflow: Normally patent  bilateral common femoral arteries and femoral bifurcations. Veins: Venous phase imaging demonstrates normal patency of venous structures in the abdomen and pelvis. No anatomic variants. Venous opacification in the pelvis is suboptimal for evaluation patency but no obvious signs of thrombosis. Review of the MIP images confirms the above findings. NON-VASCULAR Lower chest: Stable scarring and nodularity within the inferior subpleural lingula likely reflecting prior inflammatory process. Hepatobiliary: Cholelithiasis again noted. No gallbladder distension or inflammation. No biliary ductal dilatation. The liver again demonstrates numerous scattered small cysts throughout both lobes. No solid or enhancing hepatic masses identified. Pancreas: Unremarkable. No pancreatic ductal dilatation or surrounding inflammatory changes. Spleen: Normal in size without focal abnormality. Adrenals/Urinary Tract: Adrenal glands are unremarkable. Kidneys are normal, without  renal calculi, focal lesion, or hydronephrosis. Bladder is unremarkable. Stomach/Bowel: Compared to the prior study, the cecum and ascending colon now demonstrate some subtle pericolonic inflammation as well as the previously identified concentric intramural fat infiltration. Findings are suggestive an acute on chronic inflammatory process such as inflammatory bowel disease with suggestion of active colitis. This is likely the source of gastrointestinal bleeding. No active contrast extravasation is identified into the gastrointestinal tract on both arterial and venous phases of imaging. No evidence of bowel obstruction, ileus or lesion. No free intraperitoneal air identified. The appendix is normal. Lymphatic: No enlarged abdominal or pelvic lymph nodes. Reproductive: Prostate is unremarkable. Other: No abdominal wall hernia or abnormality. No abdominopelvic ascites. Musculoskeletal: No acute or significant osseous findings. IMPRESSION: 1. No evidence of active contrast extravasation into the gastrointestinal tract. 2. Compared to the prior study, the cecum and ascending colon now demonstrate some subtle pericolonic inflammation as well as the previously identified concentric intramural fat infiltration. Findings are suggestive of an acute on chronic inflammatory process such as inflammatory bowel disease with suggestion of active colitis. This is likely the source of gastrointestinal bleeding. 3. Stable cholelithiasis without evidence of acute cholecystitis or biliary obstruction. 4. Stable scarring and nodularity within the inferior subpleural lingula likely reflecting prior inflammatory process. Electronically Signed   By: Irish Lack M.D.   On: 06/04/2023 09:25   US Abdomen Limited RUQ (LIVER/GB)  Result Date: 06/03/2023 CLINICAL DATA:  Right upper quadrant pain. EXAM: ULTRASOUND ABDOMEN LIMITED RIGHT UPPER QUADRANT COMPARISON:  None Available. FINDINGS: Gallbladder: A nonmobile 2.0 cm gallstone is seen  within the dependent portion of the gallbladder lumen. There is no evidence of gallbladder wall thickening (3.0 mm) no sonographic Murphy sign noted by sonographer. Common bile duct: Diameter: 5.2 mm Liver: A 1.6 cm x 0.9 cm x 1.3 cm hepatic cyst is seen within the inferior aspect of the right lobe of the liver. No abnormal flow is seen within this region on color Doppler evaluation. Diffusely increased echogenicity of the liver parenchyma is noted. Portal vein is patent on color Doppler imaging with normal direction of blood flow towards the liver. Other: None. IMPRESSION: 1. Cholelithiasis, without evidence of acute cholecystitis. 2. Hepatic steatosis with a small simple hepatic cyst seen within the right lobe of the liver. Electronically Signed   By: Aram Candela M.D.   On: 06/03/2023 01:25   CT ABDOMEN PELVIS W CONTRAST  Result Date: 06/02/2023 CLINICAL DATA:  Left upper quadrant pain that radiates to the back x3 days. EXAM: CT ABDOMEN AND PELVIS WITH CONTRAST TECHNIQUE: Multidetector CT imaging of the abdomen and pelvis was performed using the standard protocol following bolus administration of intravenous contrast. RADIATION DOSE REDUCTION: This exam was performed according to the departmental dose-optimization program  which includes automated exposure control, adjustment of the mA and/or kV according to patient size and/or use of iterative reconstruction technique. CONTRAST:  OMNIPAQUE IOHEXOL 300 MG/ML  SOLN COMPARISON:  None Available. FINDINGS: Lower chest: Ill-defined 2 mm lingular noncalcified lung nodules are noted. Hepatobiliary: 1.7 cm foci of parenchymal low attenuation are seen within the posteromedial aspect of the right lobe of the liver, liver parenchyma anterior to the gallbladder fossa and within the posterior aspect of the liver dome. Additional numerous punctate foci of parenchymal low attenuation are seen scattered throughout the liver. A 2.0 cm gallstone is seen within the  lumen of a mildly distended gallbladder. There is no evidence of biliary dilatation. Pancreas: Unremarkable. No pancreatic ductal dilatation or surrounding inflammatory changes. Spleen: Normal in size without focal abnormality. Adrenals/Urinary Tract: Adrenal glands are unremarkable. Kidneys are normal in size, without renal calculi or hydronephrosis. An 11 mm diameter cyst is seen within the mid left kidney. Bladder is unremarkable. Stomach/Bowel: Stomach is within normal limits. Appendix appears normal. No evidence of bowel dilatation. Diffuse fatty infiltration of a moderately thickened colonic wall is seen throughout the ascending colon. Vascular/Lymphatic: No significant vascular findings are present. No enlarged abdominal or pelvic lymph nodes. Reproductive: Prostate is unremarkable. Other: No abdominal wall hernia or abnormality. No abdominopelvic ascites. Musculoskeletal: No acute or significant osseous findings. IMPRESSION: 1. Cholelithiasis. 2. Multiple hepatic cysts. 3. Diffuse fatty infiltration of a moderately thickened colonic wall throughout the ascending colon, which may represent the sequelae of prior colitis. 4. Ill-defined 2 mm lingular noncalcified lung nodules. Further evaluation with dedicated nonemergent chest CT is recommended. Electronically Signed   By: Aram Candela M.D.   On: 06/02/2023 22:16    Procedures Procedures    Medications Ordered in ED Medications  iohexol (OMNIPAQUE) 350 MG/ML injection 100 mL (100 mLs Intravenous Contrast Given 06/04/23 0834)  oxyCODONE-acetaminophen (PERCOCET/ROXICET) 5-325 MG per tablet 1 tablet (1 tablet Oral Given 06/04/23 1035)    ED Course/ Medical Decision Making/ A&P                                 Medical Decision Making 62 year old male with past medical history of hemochromatosis and atrial fibrillation on Eliquis presenting to the emergency department today with concern for abdominal pain or rectal bleeding.  I will further  evaluate the patient here with basic labs as well as a type and screen in the event the patient does require transfusion.  I have reviewed his imaging studies from a few days ago.  He did have a cholelithiasis but no findings consistent with cholecystitis.  The patient does not appear to be point tender in the right upper quadrant at this time.  He is more diffusely tender with periumbilical tenderness.  I will repeat a CT scan at this time we will do a CT angiogram to evaluate for any active bleeding.  I will discuss his case with gastroenterology after his workup here is complete for ultimate disposition given his anticoagulated status.  The patient CT scan shows colitis.  His hemoglobin has dropped by 1.5 g.  I did call and discussed this with gastroenterology.  They recommended observation admission to trend the patient's hemoglobin.  They will see the patient here in consultation.  They will make further recommendations on antibiotics and further treatments after they evaluate the patient.  He will be admitted for further evaluation.  Amount and/or Complexity of Data Reviewed  Labs: ordered. Radiology: ordered.  Risk Prescription drug management. Decision regarding hospitalization.           Final Clinical Impression(s) / ED Diagnoses Final diagnoses:  Rectal bleeding  Colitis    Rx / DC Orders ED Discharge Orders     None         Durwin Glaze, MD 06/04/23 1047

## 2023-06-04 NOTE — Progress Notes (Signed)
   06/04/23 1508  TOC Brief Assessment  Insurance and Status Reviewed  Patient has primary care physician Yes  Home environment has been reviewed spouse  Prior level of function: independent  Prior/Current Home Services No current home services  Social Determinants of Health Reivew SDOH reviewed no interventions necessary  Readmission risk has been reviewed Yes  Transition of care needs no transition of care needs at this time      Transition of Care Department [facility]) has reviewed patient and no TOC needs have been identified at this time. We will continue to monitor patient advancement through interdisciplinary progression rounds. If new patient transition needs arise, please place a TOC consult.

## 2023-06-04 NOTE — ED Notes (Signed)
Called the floor and Glory Rosebush is ready to receive paitent

## 2023-06-04 NOTE — ED Notes (Signed)
ED TO INPATIENT HANDOFF REPORT  ED Nurse Name and Phone #: Victorino Dike [phone]  S Name/Age/Gender Michael Choi 62 y.o. male Room/Bed: 020C/020C  Code Status   Code Status: Prior  Home/SNF/Other Home Patient oriented to: self, place, time, and situation Is this baseline? Yes   Triage Complete: Triage complete  Chief Complaint Colitis [K52.9]  Triage Note Pt reports abdominal pain and dark stools. Pt reports he's on eliquis. Denies dizziness or lightheadedness.    Allergies Allergies  Allergen Reactions   Latex Itching    Level of Care/Admitting Diagnosis ED Disposition     ED Disposition  Admit   Condition  --   Comment  Hospital Area: [facility] [[id number]]  Level of Care: Telemetry Medical [104]  May admit patient to [facility] or [facility] if equivalent level of care is available:: No  Covid Evaluation: Asymptomatic - no recent exposure (last 10 days) testing not required  Diagnosis: Colitis [[id number]]  Admitting Physician: Clydie Braun [[id number]]  Attending Physician: Clydie Braun [[id number]]  Certification:: I certify this patient will need inpatient services for at least 2 midnights  Expected Medical Readiness: 06/06/2023          B Medical/Surgery History Past Medical History:  Diagnosis Date   Atrial flutter [facility])    s/p CTI ablation by Dr Graciela Husbands   Atypical nevus 04/16/2016   left outer back (mild)    Hypertension    Melanoma (HCC)    stage I,  resected.  did not require chemotherapy or radiation   Persistent atrial fibrillation [facility])    Past Surgical History:  Procedure Laterality Date   A-FLUTTER ABLATION N/A 03/05/2018   Procedure: A-FLUTTER ABLATION;  Surgeon: Duke Salvia, MD;  Location: [facility] INVASIVE CV LAB;  Service: Cardiovascular;  Laterality: N/A;   ATRIAL FIBRILLATION ABLATION N/A 08/23/2020   Procedure: ATRIAL FIBRILLATION ABLATION;  Surgeon: Hillis Range, MD;  Location: [facility] INVASIVE CV LAB;  Service:  Cardiovascular;  Laterality: N/A;   ATRIAL FIBRILLATION ABLATION N/A 05/18/2023   Procedure: ATRIAL FIBRILLATION ABLATION;  Surgeon: Lanier Prude, MD;  Location: [facility] INVASIVE CV LAB;  Service: Cardiovascular;  Laterality: N/A;   CARDIOVERSION N/A 11/27/2017   Procedure: CARDIOVERSION;  Surgeon: Elder Negus, MD;  Location: [facility] ENDOSCOPY;  Service: Cardiovascular;  Laterality: N/A;   CARDIOVERSION N/A 03/18/2019   Procedure: CARDIOVERSION;  Surgeon: Lewayne Bunting, MD;  Location: [facility] ENDOSCOPY;  Service: Cardiovascular;  Laterality: N/A;   CARDIOVERSION N/A 07/18/2020   Procedure: CARDIOVERSION;  Surgeon: Jake Bathe, MD;  Location: [facility] ENDOSCOPY;  Service: Cardiovascular;  Laterality: N/A;   CARDIOVERSION N/A 01/18/2021   Procedure: CARDIOVERSION;  Surgeon: Sande Rives, MD;  Location: [facility] ENDOSCOPY;  Service: Cardiovascular;  Laterality: N/A;   KNEE ARTHROSCOPY     TEE WITHOUT CARDIOVERSION N/A 11/27/2017   Procedure: TRANSESOPHAGEAL ECHOCARDIOGRAM (TEE);  Surgeon: Elder Negus, MD;  Location: [facility] ENDOSCOPY;  Service: Cardiovascular;  Laterality: N/A;   TEE WITHOUT CARDIOVERSION N/A 03/18/2019   Procedure: TRANSESOPHAGEAL ECHOCARDIOGRAM (TEE);  Surgeon: Lewayne Bunting, MD;  Location: [facility] ENDOSCOPY;  Service: Cardiovascular;  Laterality: N/A;     A IV Location/Drains/Wounds Patient Lines/Drains/Airways Status     Active Line/Drains/Airways     Name Placement date Placement time Site Days   Peripheral IV 06/04/23 20 G Anterior;Distal;Left;Upper Arm 06/04/23  0747  Arm  less than 1            Intake/Output Last 24 hours No intake or  output data in the 24 hours ending 06/04/23 1112  Labs/Imaging Results for orders placed or performed during the hospital encounter of 06/04/23 (from the past 48 hour(s))  CBC with Differential     Status: Abnormal   Collection Time: 06/04/23  7:41 AM  Result Value Ref Range   WBC 7.3 4.0 - 10.5 K/uL   RBC 5.52 4.22 -  5.81 MIL/uL   Hemoglobin 17.0 13.0 - 17.0 g/dL   HCT 95.2 (H) 84.1 - 32.4 %   MCV 94.7 80.0 - 100.0 fL   MCH 30.8 26.0 - 34.0 pg   MCHC 32.5 30.0 - 36.0 g/dL   RDW 40.1 02.7 - 25.3 %   Platelets 188 150 - 400 K/uL   nRBC 0.0 0.0 - 0.2 %   Neutrophils Relative % 66 %   Neutro Abs 4.8 1.7 - 7.7 K/uL   Lymphocytes Relative 17 %   Lymphs Abs 1.3 0.7 - 4.0 K/uL   Monocytes Relative 16 %   Monocytes Absolute 1.2 (H) 0.1 - 1.0 K/uL   Eosinophils Relative 1 %   Eosinophils Absolute 0.1 0.0 - 0.5 K/uL   Basophils Relative 0 %   Basophils Absolute 0.0 0.0 - 0.1 K/uL   Immature Granulocytes 0 %   Abs Immature Granulocytes 0.02 0.00 - 0.07 K/uL    Comment: Performed at [facility] Lab, [address] [place], [place] [address]  Comprehensive metabolic panel     Status: Abnormal   Collection Time: 06/04/23  7:41 AM  Result Value Ref Range   Sodium 133 (L) 135 - 145 mmol/L   Potassium 3.8 3.5 - 5.1 mmol/L   Chloride 101 98 - 111 mmol/L   CO2 24 22 - 32 mmol/L   Glucose, Bld 98 70 - 99 mg/dL    Comment: Glucose reference range applies only to samples taken after fasting for at least 8 hours.   BUN 15 8 - 23 mg/dL   Creatinine, Ser 3.47 (H) 0.61 - 1.24 mg/dL   Calcium 8.9 8.9 - 42.5 mg/dL   Total Protein 6.7 6.5 - 8.1 g/dL   Albumin 3.2 (L) 3.5 - 5.0 g/dL   AST 25 15 - 41 U/L   ALT 25 0 - 44 U/L   Alkaline Phosphatase 48 38 - 126 U/L   Total Bilirubin 0.6 <1.2 mg/dL   GFR, Estimated 55 (L) >60 mL/min    Comment: (NOTE) Calculated using the CKD-EPI Creatinine Equation (2021)    Anion gap 8 5 - 15    Comment: Performed at [facility], [address], [place], [place] [address]  Lipase, blood     Status: None   Collection Time: 06/04/23  7:41 AM  Result Value Ref Range   Lipase 25 11 - 51 U/L    Comment: Performed at [facility], [address], [place], [place] [address]  Protime-INR     Status: Abnormal   Collection Time: 06/04/23  7:41 AM  Result Value Ref Range    Prothrombin Time 16.5 (H) 11.4 - 15.2 seconds   INR 1.3 (H) 0.8 - 1.2    Comment: (NOTE) INR goal varies based on device and disease states. Performed at [facility], [address] [place], [place] [address]   POC occult blood, ED     Status: Abnormal   Collection Time: 06/04/23  7:41 AM  Result Value Ref Range   Fecal Occult Bld POSITIVE (A) NEGATIVE  Type and screen  [facility]     Status: None   Collection Time: 06/04/23  7:46 AM  Result Value Ref Range   ABO/RH(D) A POS    Antibody Screen NEG    Sample Expiration      06/07/2023,2359 Performed at [facility] Lab, [address] [place], [place] [address]    CT ANGIO GI BLEED  Result Date: 06/04/2023 CLINICAL DATA:  Abdominal pain and rectal bleeding. EXAM: CTA ABDOMEN AND PELVIS WITHOUT AND WITH CONTRAST TECHNIQUE: Multidetector CT imaging of the abdomen and pelvis was performed using the standard protocol during bolus administration of intravenous contrast. Multiplanar reconstructed images and MIPs were obtained and reviewed to evaluate the vascular anatomy. RADIATION DOSE REDUCTION: This exam was performed according to the departmental dose-optimization program which includes automated exposure control, adjustment of the mA and/or kV according to patient size and/or use of iterative reconstruction technique. CONTRAST:  OMNIPAQUE IOHEXOL 350 MG/ML SOLN COMPARISON:  CT of the abdomen and pelvis on 06/02/2023. FINDINGS: VASCULAR Aorta: Normally patent and without significant atherosclerosis. No aneurysm or dissection. Celiac: Normally patent. Normally patent branches and branch vessel anatomy. SMA: Normally patent. Renals: Normally patent bilateral single renal arteries. IMA: Normally patent. Inflow: Normally patent bilateral iliac arteries. Proximal Outflow: Normally patent bilateral common femoral arteries and femoral bifurcations. Veins: Venous phase imaging demonstrates normal patency of venous  structures in the abdomen and pelvis. No anatomic variants. Venous opacification in the pelvis is suboptimal for evaluation patency but no obvious signs of thrombosis. Review of the MIP images confirms the above findings. NON-VASCULAR Lower chest: Stable scarring and nodularity within the inferior subpleural lingula likely reflecting prior inflammatory process. Hepatobiliary: Cholelithiasis again noted. No gallbladder distension or inflammation. No biliary ductal dilatation. The liver again demonstrates numerous scattered small cysts throughout both lobes. No solid or enhancing hepatic masses identified. Pancreas: Unremarkable. No pancreatic ductal dilatation or surrounding inflammatory changes. Spleen: Normal in size without focal abnormality. Adrenals/Urinary Tract: Adrenal glands are unremarkable. Kidneys are normal, without renal calculi, focal lesion, or hydronephrosis. Bladder is unremarkable. Stomach/Bowel: Compared to the prior study, the cecum and ascending colon now demonstrate some subtle pericolonic inflammation as well as the previously identified concentric intramural fat infiltration. Findings are suggestive an acute on chronic inflammatory process such as inflammatory bowel disease with suggestion of active colitis. This is likely the source of gastrointestinal bleeding. No active contrast extravasation is identified into the gastrointestinal tract on both arterial and venous phases of imaging. No evidence of bowel obstruction, ileus or lesion. No free intraperitoneal air identified. The appendix is normal. Lymphatic: No enlarged abdominal or pelvic lymph nodes. Reproductive: Prostate is unremarkable. Other: No abdominal wall hernia or abnormality. No abdominopelvic ascites. Musculoskeletal: No acute or significant osseous findings. IMPRESSION: 1. No evidence of active contrast extravasation into the gastrointestinal tract. 2. Compared to the prior study, the cecum and ascending colon now demonstrate  some subtle pericolonic inflammation as well as the previously identified concentric intramural fat infiltration. Findings are suggestive of an acute on chronic inflammatory process such as inflammatory bowel disease with suggestion of active colitis. This is likely the source of gastrointestinal bleeding. 3. Stable cholelithiasis without evidence of acute cholecystitis or biliary obstruction. 4. Stable scarring and nodularity within the inferior subpleural lingula likely reflecting prior inflammatory process. Electronically Signed   By: Irish Lack M.D.   On: 06/04/2023 09:25   US Abdomen Limited RUQ (LIVER/GB)  Result Date: 06/03/2023 CLINICAL DATA:  Right upper quadrant pain. EXAM: ULTRASOUND  ABDOMEN LIMITED RIGHT UPPER QUADRANT COMPARISON:  None Available. FINDINGS: Gallbladder: A nonmobile 2.0 cm gallstone is seen within the dependent portion of the gallbladder lumen. There is no evidence of gallbladder wall thickening (3.0 mm) no sonographic Murphy sign noted by sonographer. Common bile duct: Diameter: 5.2 mm Liver: A 1.6 cm x 0.9 cm x 1.3 cm hepatic cyst is seen within the inferior aspect of the right lobe of the liver. No abnormal flow is seen within this region on color Doppler evaluation. Diffusely increased echogenicity of the liver parenchyma is noted. Portal vein is patent on color Doppler imaging with normal direction of blood flow towards the liver. Other: None. IMPRESSION: 1. Cholelithiasis, without evidence of acute cholecystitis. 2. Hepatic steatosis with a small simple hepatic cyst seen within the right lobe of the liver. Electronically Signed   By: Aram Candela M.D.   On: 06/03/2023 01:25   CT ABDOMEN PELVIS W CONTRAST  Result Date: 06/02/2023 CLINICAL DATA:  Left upper quadrant pain that radiates to the back x3 days. EXAM: CT ABDOMEN AND PELVIS WITH CONTRAST TECHNIQUE: Multidetector CT imaging of the abdomen and pelvis was performed using the standard protocol following bolus  administration of intravenous contrast. RADIATION DOSE REDUCTION: This exam was performed according to the departmental dose-optimization program which includes automated exposure control, adjustment of the mA and/or kV according to patient size and/or use of iterative reconstruction technique. CONTRAST:  OMNIPAQUE IOHEXOL 300 MG/ML  SOLN COMPARISON:  None Available. FINDINGS: Lower chest: Ill-defined 2 mm lingular noncalcified lung nodules are noted. Hepatobiliary: 1.7 cm foci of parenchymal low attenuation are seen within the posteromedial aspect of the right lobe of the liver, liver parenchyma anterior to the gallbladder fossa and within the posterior aspect of the liver dome. Additional numerous punctate foci of parenchymal low attenuation are seen scattered throughout the liver. A 2.0 cm gallstone is seen within the lumen of a mildly distended gallbladder. There is no evidence of biliary dilatation. Pancreas: Unremarkable. No pancreatic ductal dilatation or surrounding inflammatory changes. Spleen: Normal in size without focal abnormality. Adrenals/Urinary Tract: Adrenal glands are unremarkable. Kidneys are normal in size, without renal calculi or hydronephrosis. An 11 mm diameter cyst is seen within the mid left kidney. Bladder is unremarkable. Stomach/Bowel: Stomach is within normal limits. Appendix appears normal. No evidence of bowel dilatation. Diffuse fatty infiltration of a moderately thickened colonic wall is seen throughout the ascending colon. Vascular/Lymphatic: No significant vascular findings are present. No enlarged abdominal or pelvic lymph nodes. Reproductive: Prostate is unremarkable. Other: No abdominal wall hernia or abnormality. No abdominopelvic ascites. Musculoskeletal: No acute or significant osseous findings. IMPRESSION: 1. Cholelithiasis. 2. Multiple hepatic cysts. 3. Diffuse fatty infiltration of a moderately thickened colonic wall throughout the ascending colon, which may  represent the sequelae of prior colitis. 4. Ill-defined 2 mm lingular noncalcified lung nodules. Further evaluation with dedicated nonemergent chest CT is recommended. Electronically Signed   By: Aram Candela M.D.   On: 06/02/2023 22:16    Pending Labs Unresulted Labs (From admission, onward)     Start     Ordered   06/04/23 0738  Occult blood card to lab, stool  Once,   URGENT        06/04/23 0737            Vitals/Pain Today's Vitals   06/04/23 0707 06/04/23 0730 06/04/23 0745 06/04/23 0800  BP:  137/79 124/80 (!) 113/92  Pulse:  (!) 43 (!) 43 (!) 42  Resp:  18  Temp: 97.7 F (36.5 C)     TempSrc: Oral     SpO2:  98% 99% 96%  PainSc: 5        Isolation Precautions No active isolations  Medications Medications  iohexol (OMNIPAQUE) 350 MG/ML injection 100 mL (100 mLs Intravenous Contrast Given 06/04/23 0834)  oxyCODONE-acetaminophen (PERCOCET/ROXICET) 5-325 MG per tablet 1 tablet (1 tablet Oral Given 06/04/23 1035)    Mobility walks     Focused Assessments     R Recommendations: See Admitting Provider Note  Report given to:   Additional Notes:

## 2023-06-04 NOTE — H&P (Addendum)
History and Physical    Patient: Michael Choi ZOX:[id number] DOB: [birth year] DOA: 06/04/2023 DOS: the patient was seen and examined on 06/04/2023 PCP: [facility]  Patient coming from: Home  Chief Complaint:  Chief Complaint  Patient presents with   GI Bleeding   HPI: Michael Choi is a 62 y.o. male with medical history significant of hypertension, paroxysmal atrial fibrillation s/p ablation   on 10/28, melanoma who presents with complaints of rectal bleeding this morning.   He had been seen in the emergency department on 11/12 after he had developed left upper quadrant abdominal pain.  Ultrasound was obtained which noted cholelithiasis without acute cholecystitis.  CT scan of the abdomen pelvis noted cholelithiasis, multiple hepatic cysts, and diffuse fatty infiltration thickening colonic wall throughout the ascending colon that may represent sequela of colitis.  Ultimately patient was discharged home and given prescription for hydrocodone.  He had taken the hydrocodone with some relief in pain and stated at home yesterday.  However early this morning reported waking up and reported pain had moved down to the left lower quadrant.  When he went to use the restroom noted bright red blood per rectum and only a small amount of stools.  Prior to this patient had reported having issues with constipation.  Patient does admit to intermittently using ibuprofen.  He reports that he just recently had colonoscopy and EGD with Dr. Tomasa Rand back in March of this year.  EGD noted LA grade A a esophagitis without bleeding which was done related and gastritis which was biopsied.  Colonoscopy in polyps which were removed and hemorrhoids.  He has had intermittent bleeding but not this severe in the past.  In the emergency department patient was noted to be afebrile with pulse 42-49, and all other vital signs maintained.  Labs significant for hemoglobin 17, sodium 133, BUN 15, creatinine 1.4.  CT  angiogram bleeding study noted no evidence of active contrast extravasation, acute on chronic inflammatory changes suggestive of colitis.  Fecal occult testing was positive.  Neurology have been formally consulted.  Patient has been given oxycodone.  TRH called.  Review of Systems: As mentioned in the history of present illness. All other systems reviewed and are negative. Past Medical History:  Diagnosis Date   Atrial flutter [facility])    s/p CTI ablation by Dr Graciela Husbands   Atypical nevus 04/16/2016   left outer back (mild)    Hypertension    Melanoma (HCC)    stage I,  resected.  did not require chemotherapy or radiation   Persistent atrial fibrillation [facility])    Past Surgical History:  Procedure Laterality Date   A-FLUTTER ABLATION N/A 03/05/2018   Procedure: A-FLUTTER ABLATION;  Surgeon: Duke Salvia, MD;  Location: [facility] INVASIVE CV LAB;  Service: Cardiovascular;  Laterality: N/A;   ATRIAL FIBRILLATION ABLATION N/A 08/23/2020   Procedure: ATRIAL FIBRILLATION ABLATION;  Surgeon: Hillis Range, MD;  Location: [facility] INVASIVE CV LAB;  Service: Cardiovascular;  Laterality: N/A;   ATRIAL FIBRILLATION ABLATION N/A 05/18/2023   Procedure: ATRIAL FIBRILLATION ABLATION;  Surgeon: Lanier Prude, MD;  Location: [facility] INVASIVE CV LAB;  Service: Cardiovascular;  Laterality: N/A;   CARDIOVERSION N/A 11/27/2017   Procedure: CARDIOVERSION;  Surgeon: Elder Negus, MD;  Location: [facility] ENDOSCOPY;  Service: Cardiovascular;  Laterality: N/A;   CARDIOVERSION N/A 03/18/2019   Procedure: CARDIOVERSION;  Surgeon: Lewayne Bunting, MD;  Location: [facility] ENDOSCOPY;  Service: Cardiovascular;  Laterality: N/A;   CARDIOVERSION N/A 07/18/2020  Procedure: CARDIOVERSION;  Surgeon: Jake Bathe, MD;  Location: [facility] ENDOSCOPY;  Service: Cardiovascular;  Laterality: N/A;   CARDIOVERSION N/A 01/18/2021   Procedure: CARDIOVERSION;  Surgeon: Sande Rives, MD;  Location: [facility] ENDOSCOPY;  Service: Cardiovascular;  Laterality: N/A;    KNEE ARTHROSCOPY     TEE WITHOUT CARDIOVERSION N/A 11/27/2017   Procedure: TRANSESOPHAGEAL ECHOCARDIOGRAM (TEE);  Surgeon: Elder Negus, MD;  Location: [facility] ENDOSCOPY;  Service: Cardiovascular;  Laterality: N/A;   TEE WITHOUT CARDIOVERSION N/A 03/18/2019   Procedure: TRANSESOPHAGEAL ECHOCARDIOGRAM (TEE);  Surgeon: Lewayne Bunting, MD;  Location: [facility] ENDOSCOPY;  Service: Cardiovascular;  Laterality: N/A;   Social History:  reports that he has never smoked. He has never used smokeless tobacco. He reports current alcohol use of about 1.0 standard drink of alcohol per week. He reports that he does not use drugs.  Allergies  Allergen Reactions   Latex Itching    Family History  Problem Relation Age of Onset   Cancer Mother    Heart disease Father        rheumatic heart disease s/p transplant   Hypertension Brother    Colon cancer Neg Hx    Rectal cancer Neg Hx    Stomach cancer Neg Hx     Prior to Admission medications   Medication Sig Start Date End Date Taking? Authorizing Provider  acetaminophen (TYLENOL) 500 MG tablet Take 1,000 mg by mouth every 6 (six) hours as needed for moderate pain (pain score 4-6).   Yes [provider]  Coenzyme Q10 (CO Q 10 PO) Take 1 tablet by mouth 2 (two) times a week.   Yes [provider]  Cyanocobalamin (VITAMIN B-12 PO) Take 1 tablet by mouth every morning.   Yes [provider]  ELIQUIS 5 MG TABS tablet TAKE 1 TABLET BY MOUTH TWICE A DAY 05/22/23  Yes Fenton, Clint R, PA  HYDROcodone-acetaminophen (NORCO) 5-325 MG tablet Take 2 tablets by mouth every 4 (four) hours as needed. 06/03/23  Yes Delo, Riley Lam, MD  ibuprofen (ADVIL) 200 MG tablet Take 600 mg by mouth every 6 (six) hours as needed for moderate pain (pain score 4-6).   Yes [provider]  losartan (COZAAR) 25 MG tablet Take 1 tablet (25 mg total) by mouth daily. 11/28/22  Yes Duke Salvia, MD  MAGNESIUM-ZINC PO Take 1 tablet by mouth daily.   Yes  [provider]  metoprolol tartrate (LOPRESSOR) 25 MG tablet Take 1.5 tablets (37.5 mg total) by mouth 2 (two) times daily. 04/16/23  Yes Eustace Pen, PA-C  Multiple Vitamin (MULTIVITAMIN ADULT PO) Take 1 tablet by mouth in the morning.   Yes [provider]  pantoprazole (PROTONIX) 40 MG tablet Take 1 tablet (40 mg total) by mouth daily. 05/18/23  Yes Ollis, Brandi L, NP  POTASSIUM PO Take 1 tablet by mouth every morning.   Yes [provider]  tadalafil (CIALIS) 5 MG tablet Take 5 mg by mouth daily as needed for erectile dysfunction. 01/02/23  Yes [provider]  testosterone cypionate (DEPOTESTOSTERONE CYPIONATE) 200 MG/ML injection Inject 75 mg into the muscle once a week. Saturday or Sunday 01/07/22  Yes [provider]  triamcinolone cream (KENALOG) 0.1 % Apply 1 Application topically daily as needed (Psoriasis). 01/23/23  Yes [provider]  colchicine 0.6 MG tablet Take 1 tablet (0.6 mg total) by mouth 2 (two) times daily for 5 days. Patient not taking: Reported on 06/04/2023 05/18/23 05/23/23  Jeanella Craze, NP  flecainide (TAMBOCOR) 100 MG tablet Take 100 mg by mouth 2 (two) times daily. Patient not taking: Reported on 06/04/2023 05/25/23   [provider]  flecainide (TAMBOCOR) 50 MG tablet Take 50 mg by mouth 2 (two) times daily. Patient not taking: Reported on 06/04/2023 05/25/23   [provider]  omeprazole (PRILOSEC) 40 MG capsule TAKE 1 CAPSULE (40 MG TOTAL) BY MOUTH DAILY. PRILOSEC 40 MG DAILY FOR 8 WEEKS. Patient not taking: Reported on 06/04/2023 11/11/22   Jenel Lucks, MD    Physical Exam: Vitals:   06/04/23 0707 06/04/23 0730 06/04/23 0745 06/04/23 0800  BP:  137/79 124/80 (!) 113/92  Pulse:  (!) 43 (!) 43 (!) 42  Resp:    18  Temp: 97.7 F (36.5 C)     TempSrc: Oral     SpO2:  98% 99% 96%    Constitutional: Older adult male currently no acute distress Eyes: PERRL, lids and  conjunctivae normal ENMT: Mucous membranes are moist. Normal dentition.  Neck: normal, supple   Respiratory: clear to auscultation bilaterally, no wheezing, no crackles. Normal respiratory effort. No accessory muscle use.  Cardiovascular: Regular rate and rhythm, no murmurs / rubs / gallops. No extremity edema. 2+ pedal pulses. No carotid bruits.  Abdomen: no tenderness, no masses palpated.  Umbilical hernia present that is reducible. Musculoskeletal: no clubbing / cyanosis. No joint deformity upper and lower extremities. Good ROM, no contractures. Normal muscle tone.  Skin: no rashes, lesions, ulcers. No induration Neurologic: CN 2-12 grossly intact. Sensation intact, DTR normal. Strength 5/5 in all 4.  Psychiatric: Normal judgment and insight. Alert and oriented x 3. Normal mood.   Data Reviewed:  Sinus bradycardia at 45 bpm.  Reviewed labs, imaging, and pertinent records as documented  Assessment and Plan:  Colitis with rectal bleeding Patient presents with complaints of left lower quadrant abdominal pain with bloody stools.  He is on Eliquis and took his last dose yesterday evening.  Patient's hemoglobin appears just slightly lower than his baseline, but still within normal limits.  Gastroenterology had been consulted.  Last colonoscopy performed 3/20 with Dr. Tomasa Rand for which it was noted to have polyps which were removed and hemorrhoids present. -Admit to a medical telemetry bed -Follow-up GI panel   -Rocephin and metronidazole -HTN cardiology consulted, will follow-up for any further recommendations  Renal insufficiency Creatinine noted to be 1.43 with BUN 15.  Baseline creatinine previously noted to be around 1.17-1.2 -Continue to monitor  SVT Paroxysmal atrial fibrillation on chronic anticoagulation Patient just recently had ablation with Dr. Graciela Husbands on 10/28.  He currently appears to be in sinus rhythm at this time.  Last dose of Eliquis this morning. -Held Eliquis due to  active bleeding -Resume when medically appropriate  Chronic systolic congestive heart failure Patient appears to be relatively euvolemic at this time.  Last echocardiogram noted EF to be 45 to 50% on last checked 02/2022. -Strict intake and output  - Daily weights  Essential hypertension Blood pressures currently maintained. -Continue losartan and metoprolol  Hypogonadism Patient on testosterone injections in the outpatient setting.  Cholelithiasis Patient noted to have cholelithiasis without signs of acute cholecystitis on recent ultrasound after initially being seen in the ED on 11/12 with reports of left upper quadrant abdominal pain.Marland Kitchen  Umbilical hernia Patient has a reducible umbilical hernia present on physical exam. -Recommend follow-up in outpatient setting with general surgery  GERD -Continue Protonix   Obesity  BMI 32.55 kg/m  DVT prophylaxis: SCDs Advance [facility]  Planning:   Code Status: Full Code   Consults: Gastroenterology  Family Communication: None requested  Severity of Illness: The appropriate patient status for this patient is INPATIENT. Inpatient status is judged to be reasonable and necessary in order to provide the required intensity of service to ensure the patient's safety. The patient's presenting symptoms, physical exam findings, and initial radiographic and laboratory data in the context of their chronic comorbidities is felt to place them at high risk for further clinical deterioration. Furthermore, it is not anticipated that the patient will be medically stable for discharge from the hospital within 2 midnights of admission.   * I certify that at the point of admission it is my clinical judgment that the patient will require inpatient hospital care spanning beyond 2 midnights from the point of admission due to high intensity of service, high risk for further deterioration and high frequency of surveillance required.*  Author: Clydie Braun,  MD 06/04/2023 10:41 AM  For on call review [email or web address].

## 2023-06-04 NOTE — ED Triage Notes (Signed)
Pt reports abdominal pain and dark stools. Pt reports he's on eliquis. Denies dizziness or lightheadedness.

## 2023-06-05 DIAGNOSIS — K625 Hemorrhage of anus and rectum: Secondary | ICD-10-CM | POA: Diagnosis not present

## 2023-06-05 DIAGNOSIS — I480 Paroxysmal atrial fibrillation: Secondary | ICD-10-CM | POA: Diagnosis not present

## 2023-06-05 DIAGNOSIS — K529 Noninfective gastroenteritis and colitis, unspecified: Secondary | ICD-10-CM | POA: Diagnosis not present

## 2023-06-05 LAB — GASTROINTESTINAL PANEL BY PCR, STOOL (REPLACES STOOL CULTURE)

## 2023-06-05 LAB — TYPE AND SCREEN
ABO/RH(D): A POS
Antibody Screen: NEGATIVE

## 2023-06-05 LAB — CBC
HCT: 49 % (ref 39.0–52.0)
Hemoglobin: 16.4 g/dL (ref 13.0–17.0)
MCH: 31.8 pg (ref 26.0–34.0)
MCHC: 33.5 g/dL (ref 30.0–36.0)
MCV: 95.1 fL (ref 80.0–100.0)
Platelets: 197 10*3/uL (ref 150–400)
RBC: 5.15 MIL/uL (ref 4.22–5.81)
RDW: 13.9 % (ref 11.5–15.5)
WBC: 6.1 10*3/uL (ref 4.0–10.5)
nRBC: 0 % (ref 0.0–0.2)

## 2023-06-05 MED ORDER — DICYCLOMINE HCL 10 MG PO CAPS: 10.0000 mg | ORAL_CAPSULE | Freq: Three times a day (TID) | ORAL | 0 refills | Status: DC | PRN

## 2023-06-05 MED ORDER — METRONIDAZOLE 500 MG PO TABS: 500.0000 mg | ORAL_TABLET | Freq: Three times a day (TID) | ORAL | 0 refills | Status: AC

## 2023-06-05 MED ORDER — SENNA 8.6 MG PO TABS: 1.0000 | ORAL_TABLET | Freq: Every day | ORAL | 0 refills | Status: DC | PRN

## 2023-06-05 MED ORDER — CIPROFLOXACIN HCL 500 MG PO TABS
500.0000 mg | ORAL_TABLET | Freq: Two times a day (BID) | ORAL | 0 refills | Status: AC
Start: 2023-06-06 — End: 2023-06-11

## 2023-06-05 MED ORDER — HYDROCODONE-ACETAMINOPHEN 5-325 MG PO TABS: 2.0000 | ORAL_TABLET | Freq: Four times a day (QID) | ORAL | 0 refills | Status: DC | PRN

## 2023-06-05 NOTE — Progress Notes (Signed)
[facility] ROUNDING NOTE   Subjective: Patient feeling a little better this morning.  Abdominal pain less severe.  The amount of blood seems to be decreasing.  He passed 2 bowel movements this morning and last night, both with small amounts of stool and decreasing amounts of blood.  No fevers or chills.   Objective: Vital signs in last 24 hours: Temp:  [97.8 F (36.6 C)-98.9 F (37.2 C)] 98.9 F (37.2 C) (11/15 0756) Pulse Rate:  [45-91] 50 (11/15 0756) Resp:  [11-19] 17 (11/15 0756) BP: (106-144)/(78-98) 141/93 (11/15 0756) SpO2:  [94 %-100 %] 98 % (11/15 0756) Weight:  [108.9 kg] 108.9 kg (11/14 1116) Last BM Date : 06/04/23 (mostly blood this am, per pt report) General: NAD, pleasant Caucasian male Lungs:  CTA b/l, no w/r/r Heart:  RRR, no m/r/g Abdomen:  Soft, tenderness to palpation in the bilateral left and right lower quadrants, no significant right upper quadrant tenderness, negative Murphy's, ND, +BS Ext:  No c/c/e    Intake/Output from previous day: 11/14 0701 - 11/15 0700 In: 500 [P.O.:300; IV Piggyback:200] Out: -  Intake/Output this shift: No intake/output data recorded.   Lab Results: Recent Labs    06/02/23 1554 06/04/23 0741 06/04/23 1323 06/04/23 1754 06/04/23 2242 06/05/23 0712  WBC 11.0* 7.3  --   --   --  6.1  HGB 18.5* 17.0   < > 16.9 16.3 16.4  PLT 187 188  --   --   --  197  MCV 94.8 94.7  --   --   --  95.1   < > = values in this interval not displayed.   BMET Recent Labs    06/02/23 1554 06/04/23 0741  NA 137 133*  K 4.2 3.8  CL 101 101  CO2 30 24  GLUCOSE 88 98  BUN 13 15  CREATININE 1.38* 1.43*  CALCIUM 9.8 8.9   LFT Recent Labs    06/02/23 1554 06/04/23 0741  PROT 7.9 6.7  ALBUMIN 4.1 3.2*  AST 20 25  ALT 27 25  ALKPHOS 58 48  BILITOT 0.5 0.6   PT/INR Recent Labs    06/04/23 0741  INR 1.3*      Imaging/Other results: CT ANGIO GI BLEED  Result Date: 06/04/2023 CLINICAL DATA:  Abdominal  pain and rectal bleeding. EXAM: CTA ABDOMEN AND PELVIS WITHOUT AND WITH CONTRAST TECHNIQUE: Multidetector CT imaging of the abdomen and pelvis was performed using the standard protocol during bolus administration of intravenous contrast. Multiplanar reconstructed images and MIPs were obtained and reviewed to evaluate the vascular anatomy. RADIATION DOSE REDUCTION: This exam was performed according to the departmental dose-optimization program which includes automated exposure control, adjustment of the mA and/or kV according to patient size and/or use of iterative reconstruction technique. CONTRAST:  OMNIPAQUE IOHEXOL 350 MG/ML SOLN COMPARISON:  CT of the abdomen and pelvis on 06/02/2023. FINDINGS: VASCULAR Aorta: Normally patent and without significant atherosclerosis. No aneurysm or dissection. Celiac: Normally patent. Normally patent branches and branch vessel anatomy. SMA: Normally patent. Renals: Normally patent bilateral single renal arteries. IMA: Normally patent. Inflow: Normally patent bilateral iliac arteries. Proximal Outflow: Normally patent bilateral common femoral arteries and femoral bifurcations. Veins: Venous phase imaging demonstrates normal patency of venous structures in the abdomen and pelvis. No anatomic variants. Venous opacification in the pelvis is suboptimal for evaluation patency but no obvious signs of thrombosis. Review of the MIP images confirms the above findings. NON-VASCULAR Lower chest: Stable scarring and nodularity within the  inferior subpleural lingula likely reflecting prior inflammatory process. Hepatobiliary: Cholelithiasis again noted. No gallbladder distension or inflammation. No biliary ductal dilatation. The liver again demonstrates numerous scattered small cysts throughout both lobes. No solid or enhancing hepatic masses identified. Pancreas: Unremarkable. No pancreatic ductal dilatation or surrounding inflammatory changes. Spleen: Normal in size without focal  abnormality. Adrenals/Urinary Tract: Adrenal glands are unremarkable. Kidneys are normal, without renal calculi, focal lesion, or hydronephrosis. Bladder is unremarkable. Stomach/Bowel: Compared to the prior study, the cecum and ascending colon now demonstrate some subtle pericolonic inflammation as well as the previously identified concentric intramural fat infiltration. Findings are suggestive an acute on chronic inflammatory process such as inflammatory bowel disease with suggestion of active colitis. This is likely the source of gastrointestinal bleeding. No active contrast extravasation is identified into the gastrointestinal tract on both arterial and venous phases of imaging. No evidence of bowel obstruction, ileus or lesion. No free intraperitoneal air identified. The appendix is normal. Lymphatic: No enlarged abdominal or pelvic lymph nodes. Reproductive: Prostate is unremarkable. Other: No abdominal wall hernia or abnormality. No abdominopelvic ascites. Musculoskeletal: No acute or significant osseous findings. IMPRESSION: 1. No evidence of active contrast extravasation into the gastrointestinal tract. 2. Compared to the prior study, the cecum and ascending colon now demonstrate some subtle pericolonic inflammation as well as the previously identified concentric intramural fat infiltration. Findings are suggestive of an acute on chronic inflammatory process such as inflammatory bowel disease with suggestion of active colitis. This is likely the source of gastrointestinal bleeding. 3. Stable cholelithiasis without evidence of acute cholecystitis or biliary obstruction. 4. Stable scarring and nodularity within the inferior subpleural lingula likely reflecting prior inflammatory process. Electronically Signed   By: Irish Lack M.D.   On: 06/04/2023 09:25      Assessment and Plan:  62 year old male with history of atrial fibrillation with recent ablation, admitted with 1 week of abdominal pain,  hematochezia and loose stools, with evidence of inflammatory changes in the right colon.  Colitis, hematochezia Given short duration of symptoms, infectious colitis is highest on differential, although atypical given lack of diarrhea.  Ischemic colitis also possible, but unusual to see continued bleeding for this long, also distribution of inflammation atypical for ischemic colitis.  New onset inflammatory bowel disease also very possible, but will be very difficult to accurately diagnose in the acute setting.  I think the patient is stable enough for discharge.  - Would recommend the patient be discharged with a 5-day course of antibiotics (such as ciprofloxacin, Flagyl) - Recommend Bentyl as needed for abdominal pain - Recommend senna as needed for constipation - Will follow-up with the patient in 7 to 10 days.  If patient not improving, will consider further evaluation to include repeat colonoscopy - Await results of GI pathogen panel and calprotectin. - Okay to resume anticoagulation from GI standpoint.    Jenel Lucks, MD  06/05/2023, 8:54 AM [facility]

## 2023-06-05 NOTE — Discharge Summary (Signed)
Physician Discharge Summary  Michael Choi VOZ:[id number] DOB: May 31, 1961 DOA: 06/04/2023  PCP: [facility]  Admit date: 06/04/2023 Discharge date: 06/05/2023  Admitted From: Home Disposition: Home  Recommendations for Outpatient Follow-up:  Follow up with PCP in 1 week with repeat CBC/BMP Outpatient follow-up with GI Follow up in ED if symptoms worsen or new appear   Home Health: No Equipment/Devices: None  Discharge Condition: Stable CODE STATUS: Full Diet recommendation: Heart healthy  Brief/Interim Summary: 62 y.o. male with medical history significant of hypertension, paroxysmal atrial fibrillation s/p ablation   on 10/28, melanoma presented with rectal bleeding with intermittent abdominal pain.  On presentation, workup including CT angiogram bleeding study showed no evidence of active contrast extravasation, acute on chronic inflammatory changes suggestive of colitis.  Fecal occult testing was positive.  He was started on IV antibiotics.  GI was consulted.  Subsequently, his condition has remained stable and he has not had any more rectal bleeding since admission.  Hemoglobin has remained stable.  He is tolerating diet.  GI has cleared the patient for discharge on oral antibiotics today.  He will be discharged home today with outpatient follow-up with PCP and GI.  Discharge Diagnoses:   Acute colitis with rectal bleeding -Imaging as above.  Currently on IV antibiotics. - GI was consulted.  Subsequently, his condition has remained stable and he has not had any more rectal bleeding since admission.  Hemoglobin has remained stable.  He is tolerating diet.  GI has cleared the patient for discharge on oral antibiotics, ciprofloxacin and Flagyl for 5 more days.  He will be discharged home today with outpatient follow-up with PCP and GI.  Stool for C. difficile negative. -Eliquis held on admission.  Will resume Eliquis on discharge as GI has given  clearance.  SVT Paroxysmal A-fib on chronic anticoagulation -Recently had ablation with Dr. Graciela Husbands on 05/18/2023.  Currently in sinus rhythm.  Eliquis plan as above.  Outpatient follow-up with cardiology/EP  Chronic systolic heart failure Essential hypertension -Currently euvolemic.  Continue metoprolol tartrate and losartan.  Outpatient follow-up with PCP/cardiology  Hyponatremia -Mild.  No labs today.  Outpatient follow-up  Hypogonadism -Continue outpatient testosterone injections  Cholelithiasis without acute cholecystitis -As seen on recent ultrasound done in the ED on 06/02/2023.  Outpatient follow-up with general surgery if needed  Umbilical hernia -Reducible.  Outpatient follow-up with general surgery if needed  GERD -Continue Protonix  Obesity -Outpatient follow-up  Mild renal insufficiency -No labs today.  Outpatient follow-up  Discharge Instructions  Discharge Instructions     Ambulatory referral to Gastroenterology   Complete by: As directed    Hospital follow-up   What is the reason for referral?: Other   Diet - low sodium heart healthy   Complete by: As directed    Increase activity slowly   Complete by: As directed       Allergies as of 06/05/2023       Reactions   Latex Itching        Medication List     STOP taking these medications    colchicine 0.6 MG tablet   ibuprofen 200 MG tablet Commonly known as: ADVIL       TAKE these medications    acetaminophen 500 MG tablet Commonly known as: TYLENOL Take 1,000 mg by mouth every 6 (six) hours as needed for moderate pain (pain score 4-6).   ciprofloxacin 500 MG tablet Commonly known as: Cipro Take 1 tablet (500 mg total) by mouth 2 (two)  times daily for 5 days. Start taking on: June 06, 2023   CO Q 10 PO Take 1 tablet by mouth 2 (two) times a week.   dicyclomine 10 MG capsule Commonly known as: BENTYL Take 1 capsule (10 mg total) by mouth 3 (three) times daily as needed  for spasms.   Eliquis 5 MG Tabs tablet Generic drug: apixaban TAKE 1 TABLET BY MOUTH TWICE A DAY   HYDROcodone-acetaminophen 5-325 MG tablet Commonly known as: Norco Take 2 tablets by mouth every 6 (six) hours as needed. What changed: when to take this   losartan 25 MG tablet Commonly known as: COZAAR Take 1 tablet (25 mg total) by mouth daily.   MAGNESIUM-ZINC PO Take 1 tablet by mouth daily.   metoprolol tartrate 25 MG tablet Commonly known as: LOPRESSOR Take 1.5 tablets (37.5 mg total) by mouth 2 (two) times daily.   metroNIDAZOLE 500 MG tablet Commonly known as: Flagyl Take 1 tablet (500 mg total) by mouth 3 (three) times daily for 5 days.   MULTIVITAMIN ADULT PO Take 1 tablet by mouth in the morning.   pantoprazole 40 MG tablet Commonly known as: PROTONIX Take 1 tablet (40 mg total) by mouth daily.   POTASSIUM PO Take 1 tablet by mouth every morning.   senna 8.6 MG Tabs tablet Commonly known as: SENOKOT Take 1 tablet (8.6 mg total) by mouth daily as needed for mild constipation.   tadalafil 5 MG tablet Commonly known as: CIALIS Take 5 mg by mouth daily as needed for erectile dysfunction.   testosterone cypionate 200 MG/ML injection Commonly known as: DEPOTESTOSTERONE CYPIONATE Inject 75 mg into the muscle once a week. Saturday or Sunday   triamcinolone cream 0.1 % Commonly known as: KENALOG Apply 1 Application topically daily as needed (Psoriasis).   VITAMIN B-12 PO Take 1 tablet by mouth every morning.          Follow-up Information     [facility]. Schedule an appointment as soon as possible for a visit in 1 week(s).   Specialty: Family Medicine Contact information: [address] [name] [address] [phone]                Allergies  Allergen Reactions   Latex Itching    Consultations: GI   Procedures/Studies: CT ANGIO GI BLEED  Result Date: 06/04/2023 CLINICAL DATA:  Abdominal pain  and rectal bleeding. EXAM: CTA ABDOMEN AND PELVIS WITHOUT AND WITH CONTRAST TECHNIQUE: Multidetector CT imaging of the abdomen and pelvis was performed using the standard protocol during bolus administration of intravenous contrast. Multiplanar reconstructed images and MIPs were obtained and reviewed to evaluate the vascular anatomy. RADIATION DOSE REDUCTION: This exam was performed according to the departmental dose-optimization program which includes automated exposure control, adjustment of the mA and/or kV according to patient size and/or use of iterative reconstruction technique. CONTRAST:  OMNIPAQUE IOHEXOL 350 MG/ML SOLN COMPARISON:  CT of the abdomen and pelvis on 06/02/2023. FINDINGS: VASCULAR Aorta: Normally patent and without significant atherosclerosis. No aneurysm or dissection. Celiac: Normally patent. Normally patent branches and branch vessel anatomy. SMA: Normally patent. Renals: Normally patent bilateral single renal arteries. IMA: Normally patent. Inflow: Normally patent bilateral iliac arteries. Proximal Outflow: Normally patent bilateral common femoral arteries and femoral bifurcations. Veins: Venous phase imaging demonstrates normal patency of venous structures in the abdomen and pelvis. No anatomic variants. Venous opacification in the pelvis is suboptimal for evaluation patency but no obvious signs of thrombosis. Review of  the MIP images confirms the above findings. NON-VASCULAR Lower chest: Stable scarring and nodularity within the inferior subpleural lingula likely reflecting prior inflammatory process. Hepatobiliary: Cholelithiasis again noted. No gallbladder distension or inflammation. No biliary ductal dilatation. The liver again demonstrates numerous scattered small cysts throughout both lobes. No solid or enhancing hepatic masses identified. Pancreas: Unremarkable. No pancreatic ductal dilatation or surrounding inflammatory changes. Spleen: Normal in size without focal  abnormality. Adrenals/Urinary Tract: Adrenal glands are unremarkable. Kidneys are normal, without renal calculi, focal lesion, or hydronephrosis. Bladder is unremarkable. Stomach/Bowel: Compared to the prior study, the cecum and ascending colon now demonstrate some subtle pericolonic inflammation as well as the previously identified concentric intramural fat infiltration. Findings are suggestive an acute on chronic inflammatory process such as inflammatory bowel disease with suggestion of active colitis. This is likely the source of gastrointestinal bleeding. No active contrast extravasation is identified into the gastrointestinal tract on both arterial and venous phases of imaging. No evidence of bowel obstruction, ileus or lesion. No free intraperitoneal air identified. The appendix is normal. Lymphatic: No enlarged abdominal or pelvic lymph nodes. Reproductive: Prostate is unremarkable. Other: No abdominal wall hernia or abnormality. No abdominopelvic ascites. Musculoskeletal: No acute or significant osseous findings. IMPRESSION: 1. No evidence of active contrast extravasation into the gastrointestinal tract. 2. Compared to the prior study, the cecum and ascending colon now demonstrate some subtle pericolonic inflammation as well as the previously identified concentric intramural fat infiltration. Findings are suggestive of an acute on chronic inflammatory process such as inflammatory bowel disease with suggestion of active colitis. This is likely the source of gastrointestinal bleeding. 3. Stable cholelithiasis without evidence of acute cholecystitis or biliary obstruction. 4. Stable scarring and nodularity within the inferior subpleural lingula likely reflecting prior inflammatory process. Electronically Signed   By: Irish Lack M.D.   On: 06/04/2023 09:25   US Abdomen Limited RUQ (LIVER/GB)  Result Date: 06/03/2023 CLINICAL DATA:  Right upper quadrant pain. EXAM: ULTRASOUND ABDOMEN LIMITED RIGHT UPPER  QUADRANT COMPARISON:  None Available. FINDINGS: Gallbladder: A nonmobile 2.0 cm gallstone is seen within the dependent portion of the gallbladder lumen. There is no evidence of gallbladder wall thickening (3.0 mm) no sonographic Murphy sign noted by sonographer. Common bile duct: Diameter: 5.2 mm Liver: A 1.6 cm x 0.9 cm x 1.3 cm hepatic cyst is seen within the inferior aspect of the right lobe of the liver. No abnormal flow is seen within this region on color Doppler evaluation. Diffusely increased echogenicity of the liver parenchyma is noted. Portal vein is patent on color Doppler imaging with normal direction of blood flow towards the liver. Other: None. IMPRESSION: 1. Cholelithiasis, without evidence of acute cholecystitis. 2. Hepatic steatosis with a small simple hepatic cyst seen within the right lobe of the liver. Electronically Signed   By: Aram Candela M.D.   On: 06/03/2023 01:25   CT ABDOMEN PELVIS W CONTRAST  Result Date: 06/02/2023 CLINICAL DATA:  Left upper quadrant pain that radiates to the back x3 days. EXAM: CT ABDOMEN AND PELVIS WITH CONTRAST TECHNIQUE: Multidetector CT imaging of the abdomen and pelvis was performed using the standard protocol following bolus administration of intravenous contrast. RADIATION DOSE REDUCTION: This exam was performed according to the departmental dose-optimization program which includes automated exposure control, adjustment of the mA and/or kV according to patient size and/or use of iterative reconstruction technique. CONTRAST:  OMNIPAQUE IOHEXOL 300 MG/ML  SOLN COMPARISON:  None Available. FINDINGS: Lower chest: Ill-defined 2 mm lingular noncalcified lung  nodules are noted. Hepatobiliary: 1.7 cm foci of parenchymal low attenuation are seen within the posteromedial aspect of the right lobe of the liver, liver parenchyma anterior to the gallbladder fossa and within the posterior aspect of the liver dome. Additional numerous punctate foci of  parenchymal low attenuation are seen scattered throughout the liver. A 2.0 cm gallstone is seen within the lumen of a mildly distended gallbladder. There is no evidence of biliary dilatation. Pancreas: Unremarkable. No pancreatic ductal dilatation or surrounding inflammatory changes. Spleen: Normal in size without focal abnormality. Adrenals/Urinary Tract: Adrenal glands are unremarkable. Kidneys are normal in size, without renal calculi or hydronephrosis. An 11 mm diameter cyst is seen within the mid left kidney. Bladder is unremarkable. Stomach/Bowel: Stomach is within normal limits. Appendix appears normal. No evidence of bowel dilatation. Diffuse fatty infiltration of a moderately thickened colonic wall is seen throughout the ascending colon. Vascular/Lymphatic: No significant vascular findings are present. No enlarged abdominal or pelvic lymph nodes. Reproductive: Prostate is unremarkable. Other: No abdominal wall hernia or abnormality. No abdominopelvic ascites. Musculoskeletal: No acute or significant osseous findings. IMPRESSION: 1. Cholelithiasis. 2. Multiple hepatic cysts. 3. Diffuse fatty infiltration of a moderately thickened colonic wall throughout the ascending colon, which may represent the sequelae of prior colitis. 4. Ill-defined 2 mm lingular noncalcified lung nodules. Further evaluation with dedicated nonemergent chest CT is recommended. Electronically Signed   By: Aram Candela M.D.   On: 06/02/2023 22:16   CT CARDIAC MORPH/PULM VEIN W/CM&W/O CA SCORE  Addendum Date: 06/01/2023   ADDENDUM REPORT: 06/01/2023 01:03 EXAM: OVER-READ INTERPRETATION  CT CHEST The following report is an over-read performed by radiologist Dr. Aram Candela of [facility] on 06/01/2023. This over-read does not include interpretation of cardiac or coronary anatomy or pathology. The coronary calcium score/coronary CTA interpretation by the cardiologist is attached. COMPARISON:  August 17, 2020  FINDINGS: Cardiovascular: There are no significant extracardiac vascular findings. Mediastinum/Nodes: There are no enlarged lymph nodes within the visualized mediastinum. Lungs/Pleura: There is no pleural effusion. A stable 3 mm noncalcified lung nodule is seen within the anterolateral aspect of the right lobe (axial CT image 25, CT series 10). Upper abdomen: No significant findings in the visualized upper abdomen. Musculoskeletal/Chest wall: No chest wall mass or suspicious osseous findings within the visualized chest. IMPRESSION: Stable 3 mm right lower lobe lung nodule. No follow-up needed if patient is low-risk.This recommendation follows the consensus statement: Guidelines for Management of Incidental Pulmonary Nodules Detected on CT Images: From the Fleischner Society 2017; Radiology 2017; 284:228-243. Electronically Signed   By: Aram Candela M.D.   On: 06/01/2023 01:03   Result Date: 06/01/2023 CLINICAL DATA:  Atrial fibrillation scheduled for an ablation. Comparison to 08/17/2020 EXAM: Cardiac CT/CTA TECHNIQUE: The patient was scanned on a Siemens Somatom scanner. FINDINGS: A 120 kV prospective scan was triggered in the descending thoracic aorta at 111 HU's. Gantry rotation speed was 280 msecs and collimation was .9 mm. No beta blockade and no NTG was given. The 3D data set was reconstructed in 5% intervals of the 60-80 % of the R-R cycle. Diastolic phases were analyzed on a dedicated work station using MPR, MIP and VRT modes. The patient received 95 cc of contrast. There is normal pulmonary vein drainage into the left atrium (2 on the right and 2 on the left) with ostial measurements as follows: RUPV: 32 mm X 25 mm RLPV: 23 mm X 22 mm LUPV: 23 mm X 14 mm LLPV: 21 mm X 11  mm The esophagus runs in the left atrial midline but is closest to the left lower pulmonary vein. Left atrial appendage assessment: Defect in the left atrial appendage that resolves on delay sequence. This is not consistent with left  atrial appendage thrombus. Morphology: Windsock Ostial measurements: 37 mm X 23 mm Depth: 24 mm Notes on transseptal puncture: No evidence of PFO or ASD. Other findings: Coronary Arteries: Normal coronary origin. The study was performed without use of NTG and insufficient for plaque evaluation. Coronary Calcium Score: 0 Percentile: 1st for age, sex, and race matched control. Aorta: Normal caliber.  Aortic atherosclerosis. Main Pulmonary Artery: Normal caliber. Systemic veins: Normal drainage Aortic Valve: Tri-leaflet.  AV calcium score 101. Mitral valve: No calcifications. Left Ventricle: Dilated Left Atrium: Dilated Right Ventricle: Dilated Right Atrium: Dilated Pericardium: Normal thickness Extra-cardiac findings: See attached radiology report for non-cardiac structures. Artifacts: Slab artifact IMPRESSION: 1. There is normal pulmonary vein drainage into the left atrium. 2. The left atrial appendage is patent. 3. The esophagus runs in the left atrial midline but is closest to the left lower pulmonary vein. 4. In comparison to prior study, no evidence of pulmonary vein stenosis. RECOMMENDATIONS: The proposed cut-off value of 1,651 AU yielded a 93 % sensitivity and 75 % specificity in grading AS severity in patients with classical low-flow, low-gradient AS. Proposed different cut-off values to define severe AS for men and women as 2,065 AU and 1,274 AU, respectively. The joint European and American recommendations for the assessment of AS consider the aortic valve calcium score as a continuum - a very high calcium score suggests severe AS and a low calcium score suggests severe AS is unlikely. Sunday Shams, et al. 2017 ESC/EACTS Guidelines for the management of valvular heart disease. Eur Heart J [phone] Coronary artery calcium (CAC) score is a strong predictor of incident coronary heart disease (CHD) and provides predictive information beyond traditional risk factors. CAC scoring is  reasonable to use in the decision to withhold, postpone, or initiate statin therapy in intermediate-risk or selected borderline-risk asymptomatic adults (age 63-75 years and LDL-C >=70 to <190 mg/dL) who do not have diabetes or established atherosclerotic cardiovascular disease (ASCVD).* In intermediate-risk (10-year ASCVD risk >=7.5% to <20%) adults or selected borderline-risk (10-year ASCVD risk >=5% to <7.5%) adults in whom a CAC score is measured for the purpose of making a treatment decision the following recommendations have been made: If CAC = 0, it is reasonable to withhold statin therapy and reassess in 5 to 10 years, as long as higher risk conditions are absent (diabetes mellitus, family history of premature CHD in first degree relatives (males <55 years; females <65 years), cigarette smoking, LDL >=190 mg/dL or other independent risk factors). If CAC is 1 to 99, it is reasonable to initiate statin therapy for patients >=63 years of age. If CAC is >=100 or >=75th percentile, it is reasonable to initiate statin therapy at any age. Cardiology referral should be considered for patients with CAC scores =400 or >=75th percentile. *2018 AHA/ACC/AACVPR/AAPA/ABC/ACPM/ADA/AGS/APhA/ASPC/NLA/PCNA Guideline on the Management of Blood Cholesterol: A Report of the American College of Cardiology/American Heart Association Task Force on Clinical Practice Guidelines. J Am Coll Cardiol. 2019;73(24):3168-3209. Riley Lam, MD Electronically Signed: By: Riley Lam M.D. On: 05/07/2023 12:00   EP STUDY  Result Date: 05/18/2023 CONCLUSIONS: 1. Successful redo PVI (reisolation of the left pulmonary veins) 2. Successful ablation/isolation of the posterior wall 3.  Successful ablation of an atypical atrial flutter encircling the  right pulmonary veins with a cycle length of 260 ms 4.  Successful ablation of an atypical atrial flutter (counterclockwise perimitral) with a cycle length of 285 ms 5.  Intracardiac echo reveals dilated left atrium, trivial pericardial effusion 6. No early apparent complications. 7. Colchicine 0.6mg  PO BID x 5 days 8. Protonix 40mg  PO daily x 45 days      Subjective: Patient seen and examined at bedside.  No rectal bleeding since admission.  Denies worsening abdominal pain.  Feels okay to go home today.  No fever, chest pain or shortness of breath reported.  Discharge Exam: Vitals:   06/05/23 0756 06/05/23 0914  BP: (!) 141/93 (!) 141/93  Pulse: (!) 50 (!) 50  Resp: 17   Temp: 98.9 F (37.2 C)   SpO2: 98%     General: Pt is alert, awake, not in acute distress.  On room air. Cardiovascular: Mild intermittent bradycardia present, S1/S2 + Respiratory: bilateral decreased breath sounds at bases Abdominal: Soft, obese, mild lower quadrant tenderness present, ND, bowel sounds + Extremities: no edema, no cyanosis    The results of significant diagnostics from this hospitalization (including imaging, microbiology, ancillary and laboratory) are listed below for reference.     Microbiology: Recent Results (from the past 240 hour(s))  Gastrointestinal Panel by PCR , Stool     Status: Abnormal   Collection Time: 06/04/23 11:55 AM   Specimen: Stool  Result Value Ref Range Status   Campylobacter species NOT DETECTED NOT DETECTED Final   Plesimonas shigelloides NOT DETECTED NOT DETECTED Final   Salmonella species DETECTED (A) NOT DETECTED Final    Comment: RESULT CALLED TO, READ BACK BY AND VERIFIED WITH: CHANCIE STOUT @0933  06/05/23 MJU    Yersinia enterocolitica NOT DETECTED NOT DETECTED Final   Vibrio species NOT DETECTED NOT DETECTED Final   Vibrio cholerae NOT DETECTED NOT DETECTED Final   Enteroaggregative E coli (EAEC) NOT DETECTED NOT DETECTED Final   Enteropathogenic E coli (EPEC) NOT DETECTED NOT DETECTED Final   Enterotoxigenic E coli (ETEC) NOT DETECTED NOT DETECTED Final   Shiga like toxin producing E coli (STEC) NOT DETECTED NOT  DETECTED Final   Shigella/Enteroinvasive E coli (EIEC) NOT DETECTED NOT DETECTED Final   Cryptosporidium NOT DETECTED NOT DETECTED Final   Cyclospora cayetanensis NOT DETECTED NOT DETECTED Final   Entamoeba histolytica NOT DETECTED NOT DETECTED Final   Giardia lamblia NOT DETECTED NOT DETECTED Final   Adenovirus F40/41 NOT DETECTED NOT DETECTED Final   Astrovirus NOT DETECTED NOT DETECTED Final   Norovirus GI/GII NOT DETECTED NOT DETECTED Final   Rotavirus A NOT DETECTED NOT DETECTED Final   Sapovirus (I, II, IV, and V) NOT DETECTED NOT DETECTED Final    Comment: Performed at [facility], [address] [place], [place] [address]  C Difficile Quick Screen w PCR reflex     Status: None   Collection Time: 06/04/23 11:55 AM   Specimen: Stool  Result Value Ref Range Status   C Diff antigen NEGATIVE NEGATIVE Final   C Diff toxin NEGATIVE NEGATIVE Final   C Diff interpretation No C. difficile detected.  Final    Comment: Performed at [facility], [address], [place], [place] [address]     Labs: BNP (last 3 results) Recent Labs    02/16/23 1653  BNP 47.1   Basic Metabolic Panel: Recent Labs  Lab 06/02/23 1554 06/04/23 0741  NA 137 133*  K 4.2 3.8  CL 101 101  CO2  30 24  GLUCOSE 88 98  BUN 13 15  CREATININE 1.38* 1.43*  CALCIUM 9.8 8.9   Liver Function Tests: Recent Labs  Lab 06/02/23 1554 06/04/23 0741  AST 20 25  ALT 27 25  ALKPHOS 58 48  BILITOT 0.5 0.6  PROT 7.9 6.7  ALBUMIN 4.1 3.2*   Recent Labs  Lab 06/02/23 1554 06/04/23 0741  LIPASE 13 25   No results for input(s): "AMMONIA" in the last 168 hours. CBC: Recent Labs  Lab 06/02/23 1554 06/04/23 0741 06/04/23 1323 06/04/23 1754 06/04/23 2242 06/05/23 0712  WBC 11.0* 7.3  --   --   --  6.1  NEUTROABS  --  4.8  --   --   --   --   HGB 18.5* 17.0 16.5 16.9 16.3 16.4  HCT 54.8* 52.3* 50.7 49.8 48.8 49.0  MCV 94.8 94.7  --   --   --  95.1  PLT 187 188  --   --   --  197    Cardiac Enzymes: No results for input(s): "CKTOTAL", "CKMB", "CKMBINDEX", "TROPONINI" in the last 168 hours. BNP: Invalid input(s): "POCBNP" CBG: No results for input(s): "GLUCAP" in the last 168 hours. D-Dimer No results for input(s): "DDIMER" in the last 72 hours. Hgb A1c No results for input(s): "HGBA1C" in the last 72 hours. Lipid Profile No results for input(s): "CHOL", "HDL", "LDLCALC", "TRIG", "CHOLHDL", "LDLDIRECT" in the last 72 hours. Thyroid function studies No results for input(s): "TSH", "T4TOTAL", "T3FREE", "THYROIDAB" in the last 72 hours.  Invalid input(s): "FREET3" Anemia work up No results for input(s): "VITAMINB12", "FOLATE", "FERRITIN", "TIBC", "IRON", "RETICCTPCT" in the last 72 hours. Urinalysis    Component Value Date/Time   COLORURINE YELLOW 06/02/2023 1554   APPEARANCEUR CLEAR 06/02/2023 1554   LABSPEC 1.029 06/02/2023 1554   PHURINE 5.5 06/02/2023 1554   GLUCOSEU NEGATIVE 06/02/2023 1554   HGBUR NEGATIVE 06/02/2023 1554   BILIRUBINUR NEGATIVE 06/02/2023 1554   KETONESUR TRACE (A) 06/02/2023 1554   PROTEINUR TRACE (A) 06/02/2023 1554   NITRITE NEGATIVE 06/02/2023 1554   LEUKOCYTESUR NEGATIVE 06/02/2023 1554   Sepsis Labs Recent Labs  Lab 06/02/23 1554 06/04/23 0741 06/05/23 0712  WBC 11.0* 7.3 6.1   Microbiology Recent Results (from the past 240 hour(s))  Gastrointestinal Panel by PCR , Stool     Status: Abnormal   Collection Time: 06/04/23 11:55 AM   Specimen: Stool  Result Value Ref Range Status   Campylobacter species NOT DETECTED NOT DETECTED Final   Plesimonas shigelloides NOT DETECTED NOT DETECTED Final   Salmonella species DETECTED (A) NOT DETECTED Final    Comment: RESULT CALLED TO, READ BACK BY AND VERIFIED WITH: CHANCIE STOUT @0933  06/05/23 MJU    Yersinia enterocolitica NOT DETECTED NOT DETECTED Final   Vibrio species NOT DETECTED NOT DETECTED Final   Vibrio cholerae NOT DETECTED NOT DETECTED Final   Enteroaggregative E  coli (EAEC) NOT DETECTED NOT DETECTED Final   Enteropathogenic E coli (EPEC) NOT DETECTED NOT DETECTED Final   Enterotoxigenic E coli (ETEC) NOT DETECTED NOT DETECTED Final   Shiga like toxin producing E coli (STEC) NOT DETECTED NOT DETECTED Final   Shigella/Enteroinvasive E coli (EIEC) NOT DETECTED NOT DETECTED Final   Cryptosporidium NOT DETECTED NOT DETECTED Final   Cyclospora cayetanensis NOT DETECTED NOT DETECTED Final   Entamoeba histolytica NOT DETECTED NOT DETECTED Final   Giardia lamblia NOT DETECTED NOT DETECTED Final   Adenovirus F40/41 NOT DETECTED NOT DETECTED Final   Astrovirus NOT DETECTED  NOT DETECTED Final   Norovirus GI/GII NOT DETECTED NOT DETECTED Final   Rotavirus A NOT DETECTED NOT DETECTED Final   Sapovirus (I, II, IV, and V) NOT DETECTED NOT DETECTED Final    Comment: Performed at [facility], [address], [place], [place] [address]  C Difficile Quick Screen w PCR reflex     Status: None   Collection Time: 06/04/23 11:55 AM   Specimen: Stool  Result Value Ref Range Status   C Diff antigen NEGATIVE NEGATIVE Final   C Diff toxin NEGATIVE NEGATIVE Final   C Diff interpretation No C. difficile detected.  Final    Comment: Performed at [facility], [address], [place], [place] [address]     Time coordinating discharge: 35 minutes  SIGNED:   Glade Lloyd, MD  [facility] 06/05/2023, 10:24 AM

## 2023-06-05 NOTE — Plan of Care (Signed)

## 2023-06-05 NOTE — Plan of Care (Signed)
  Problem: Education: Goal: Knowledge of General Education information will improve Description: Including pain rating scale, medication(s)/side effects and non-pharmacologic comfort measures Outcome: Progressing   Problem: Health Behavior/Discharge Planning: Goal: Ability to manage health-related needs will improve Outcome: Progressing   Problem: Clinical Measurements: Goal: Ability to maintain clinical measurements within normal limits will improve Outcome: Progressing Goal: Will remain free from infection Outcome: Progressing Goal: Diagnostic test results will improve Outcome: Progressing Goal: Respiratory complications will improve Outcome: Progressing Goal: Cardiovascular complication will be avoided Outcome: Progressing   Problem: Activity: Goal: Risk for activity intolerance will decrease Outcome: Progressing   Problem: Nutrition: Goal: Adequate nutrition will be maintained Outcome: Progressing   Problem: Coping: Goal: Level of anxiety will decrease Outcome: Progressing   Problem: Elimination: Goal: Will not experience complications related to bowel motility Outcome: Not Progressing Goal: Will not experience complications related to urinary retention Outcome: Progressing   Problem: Pain Management: Goal: General experience of comfort will improve Outcome: Progressing

## 2023-06-06 LAB — CALPROTECTIN, FECAL: Calprotectin, Fecal: 3170 ug/g — ABNORMAL HIGH (ref 0–120)

## 2023-06-09 ENCOUNTER — Encounter: Payer: Self-pay | Admitting: Internal Medicine

## 2023-06-12 ENCOUNTER — Telehealth: Payer: Self-pay

## 2023-06-12 NOTE — Telephone Encounter (Signed)
Contacted patient to check in to see how his symptoms were. Patient stated that he was not having symptoms anymore, he was feeling better and, had returned to work. Patient did want to let Dr.Cunningham know that he received a call stating that he had salmonella.

## 2023-06-15 ENCOUNTER — Telehealth: Payer: Self-pay | Admitting: Cardiology

## 2023-06-15 ENCOUNTER — Ambulatory Visit (HOSPITAL_COMMUNITY): Payer: 59 | Admitting: Physician Assistant

## 2023-06-15 ENCOUNTER — Encounter (HOSPITAL_COMMUNITY): Payer: Self-pay

## 2023-06-15 NOTE — Telephone Encounter (Signed)
Spoke with the patient who states that he has FMLA paper work that he will need to fill out. He is going to fax it over to [place]. Advised that he will need to come by the office to sign some release of information papers and to pay a fee. He is going to see if he can work out a time to come by as he works in [employer] from 8-5 daily. Will make LeAnne aware to look out for his papers.

## 2023-06-15 NOTE — Telephone Encounter (Signed)
Patient is requesting to speak with a nurse in regards to [facility] paperwork.

## 2023-06-22 ENCOUNTER — Ambulatory Visit: Payer: 59 | Admitting: Urology

## 2023-06-23 ENCOUNTER — Encounter: Payer: Self-pay | Admitting: Cardiology

## 2023-06-23 NOTE — Telephone Encounter (Signed)
Pt is calling to see if we received [employer] Papers. He would like a call back.

## 2023-06-24 DIAGNOSIS — Z0279 Encounter for issue of other medical certificate: Secondary | ICD-10-CM

## 2023-06-25 ENCOUNTER — Telehealth: Payer: Self-pay | Admitting: Cardiology

## 2023-06-25 NOTE — Telephone Encounter (Signed)
Paper Work Dropped Off: FMLA Paperwork for [employer]  Date: 06/25/2023  Location of paper:  Copies have been made and will be in fmla form abc folders. Originals will be placed in providers box by eod.

## 2023-06-26 NOTE — Telephone Encounter (Signed)
Paperwork completed and placed in front office.

## 2023-06-29 LAB — MISCELLANEOUS TEST

## 2023-06-29 NOTE — Telephone Encounter (Signed)
Billing notified of completed form.

## 2023-06-29 NOTE — Telephone Encounter (Signed)
[employer] forms scanned to [employer] and into chart. Original mailed to patient.

## 2023-07-07 ENCOUNTER — Telehealth: Payer: Self-pay | Admitting: Cardiology

## 2023-07-07 NOTE — Telephone Encounter (Signed)
Pt is requesting a callback regarding FMLA dates being wrong. Please advise

## 2023-07-07 NOTE — Telephone Encounter (Signed)
Spoke with patient and he states his FMLA paperwork will need to be resubmitted. He needs his ablation date from October 28th on there as well. States he was out the whole week and will need that added to [facility] and resent .

## 2023-07-08 NOTE — Telephone Encounter (Signed)
Spoke with the patient and advised that his FMLA paperwork already stated that he had a continuous period of incapacity from 05/18/23-05/25/23 for his ablation. He states that they did not approve that time for him. I advised that I will add a note on there in regards to the need for him to be out for those dates to see if that helps.

## 2023-08-14 ENCOUNTER — Telehealth: Payer: Self-pay | Admitting: Cardiology

## 2023-08-14 DIAGNOSIS — G4733 Obstructive sleep apnea (adult) (pediatric): Secondary | ICD-10-CM

## 2023-08-14 NOTE — Telephone Encounter (Signed)
Patient called stating he would like for Dr. Lalla Brothers to refer him for a sleep study.

## 2023-08-14 NOTE — Telephone Encounter (Signed)
Reordered NPSG

## 2023-08-18 ENCOUNTER — Ambulatory Visit: Payer: 59 | Admitting: Student

## 2023-08-18 ENCOUNTER — Telehealth: Payer: Self-pay | Admitting: Cardiology

## 2023-08-18 NOTE — Telephone Encounter (Signed)
   Pt c/o of Chest Pain: STAT if active CP, including tightness, pressure, jaw pain, radiating pain to shoulder/upper arm/back, CP unrelieved by Nitro. Symptoms reported of SOB, nausea, vomiting, sweating.  1. Are you having CP right now? no    2. Are you experiencing any other symptoms (ex. SOB, nausea, vomiting, sweating)? Left Side of neck is cramping    3. Is your CP continuous or coming and going? Coming and going. Left side sharpe pains   4. Have you taken Nitroglycerin? No, does not have any   5. How long have you been experiencing CP? 5 days, last 2 days have been worse    6. If NO CP at time of call then end call with telling Pt to call back or call 911 if Chest pain returns prior to return call from triage team.

## 2023-08-18 NOTE — Telephone Encounter (Signed)
Called pt in regards to CP.  Reports started 2-3 days ago.  Noted to left side of chest to middle of chest and left arm.  Describes as a heavy feeling that is worse when he inhales.  Notes cramps to left side of chest feels like someone karate chopped neck.  Had a head cold last week with a lot of coughing.  Is now over cold. Rates CP 8:10 last night  was tempted to call 911; today rates 4:10.  Feels tired, has not checked BP, reports was going in and out of Afib last week.  Advised pt of need for ED visit for evaluation.  Pt reports is at work and they are short staffed wants to know if he can wait until 5 pm.  Advised pt with symptoms would recommend urgent evaluation.  Reports will be evaluated unsure if it will be before 5 pm.  Again advised of urgent ED evaluation. No further concerns at this time.

## 2023-08-19 ENCOUNTER — Telehealth: Payer: Self-pay | Admitting: Cardiology

## 2023-08-19 NOTE — Telephone Encounter (Signed)
Pt c/o medication issue:  1. Name of Medication: Flecainide    2. How are you currently taking this medication (dosage and times per day)? Not currently taking   3. Are you having a reaction (difficulty breathing--STAT)?   4. What is your medication issue? Patient is requesting call back to discuss this medication after being in hospital. He states that he was told that he should call his cardiologist before taking this medication. Please advise.

## 2023-08-19 NOTE — Telephone Encounter (Signed)
Patient went to the ER yesterday for chest pain and facial numbness. There was concern that he may have a had a stroke.  He also reported that he has been having some palpitations and shortness of breath with activity, which was similar to how he felt prior to his recent ablation.  They recommend that he start back on flecainide at 25 mg twice daily and follow up with cardiology.  He is already scheduled for his 3 month post ablation follow up on 2/11 with Otilio Saber, PA.

## 2023-08-24 NOTE — Telephone Encounter (Signed)
Spoke with the patient who states that he is still having numbness in his face and arms. This has been ongoing since he went to the ER. He has not followed up with neurology outpatient which I have advised him to do. He denies any chest pain.  He reports heart rates have been 40-60s. He has been taking 1/2 tablet of flecainide twice daily and 1/2 tablet of metoprolol twice daily.  Patient has an appointment at our office on 2/11. Patient educated on precautions for returning to the ER>

## 2023-08-24 NOTE — Telephone Encounter (Signed)
Patient is calling back to follow up regarding this. He reports he is still having left arm and left side of face numbness. He also reports his left eye is more closed than the right.   He is requesting the test scans be reviewed due to the findings made and would like a callback today to discuss further.   Please advise.

## 2023-08-25 ENCOUNTER — Telehealth: Payer: Self-pay | Admitting: Cardiology

## 2023-08-25 NOTE — Telephone Encounter (Signed)
Attempted to call patient. Unable to leave voicemail.  

## 2023-08-25 NOTE — Telephone Encounter (Signed)
STAT if HR is under 50 or over 120 (normal HR is 60-100 beats per minute)  What is your heart rate? In the 40's  Do you have a log of your heart rate readings (document readings)? Between 63-47  Do you have any other symptoms? no

## 2023-08-26 ENCOUNTER — Telehealth: Payer: Self-pay

## 2023-08-26 ENCOUNTER — Encounter: Payer: Self-pay | Admitting: Cardiology

## 2023-08-26 NOTE — Telephone Encounter (Signed)
 Spoke with pt regarding his low HR and dizziness. Pt states he has been checking his HR regularly and that often times it is in the 30's causing him to feel dizzy. When he is up and active his HR goes up in the 50's, at times reaching 100 bpm. Pt stated he was admitted to the ED recently (1/28) for stroke like symptoms including chest and neck pain and left facial numbness that has continued since then. Pt stated he had no issues with HR during this hospital admission and therefore it was not addressed. Pt made appointment at [facility] for tomorrow 2/6. Pt was advised to go to the ED should his HR go down to the 30's tonight. Dr. Cindie will be notified.

## 2023-08-26 NOTE — Telephone Encounter (Signed)
 Pt called and reports low HR with associated lightheadedness,   At this time pt measures 50-60 HR with pulse ox, and denies associated symptoms, pt reports holding his metoprolol  (low HR), flecainide  at 12.5 mg, and yesterday pt took benadryl for sinus congestion.  Pt was called to offer appt with [facility] but pt is at work and is normally seen at [employer], and [employer] is not feasible.    Please address - thank you

## 2023-08-26 NOTE — Telephone Encounter (Signed)
See telephone encounter from 2/5

## 2023-08-26 NOTE — Progress Notes (Signed)
 Electrophysiology Clinic Note    Date:  08/27/2023  Patient ID:  Michael Choi, Miyasato Jun 03, 1961, MRN [id number] PCP:  [facility]  Cardiologist:  None Electrophysiologist: OLE ONEIDA HOLTS, MD    Discussed the use of AI scribe software for clinical note transcription with the patient, who gave verbal consent to proceed.   Patient Profile    Chief Complaint: bradycardia  History of Present Illness: Michael Choi is a 63 y.o. male with PMH notable for aflutter, persis afib, HTN, OSA; seen today for OLE ONEIDA HOLTS, MD for acute visit due to bradycardia.   He is s/p aflutter ablation 2019 S/p AFib ablation 2022 by Dr. Kelsie S/p redo ablation 04/2023 with PVI, posterior wall, as well as two atypical atrial flutter lines by Dr. Holts. He sent mychart message yesterday with concerns regarding a low heart rate in 30s. His flecainide  and BB were stopped.   On follow-up today, he has been checking his pulse via pulse ox machine. This morning when he checked it, he says it was bouncing around from 40s-100s. He was concerned at this so took 1/2 of a metoprolol  (12.5mg  lopressor ). He denies palpitations.   He recently had L face, shoulder, arm numbness and was eval'd at [facility], no acute abnormalities. He has not followed up with PCP or neurology since that time.  He diligently takes eliquis  BID, no bleeding concerns.     AAD History: Flecainide , stopped 08/2023    ROS:  Please see the history of present illness. All other systems are reviewed and otherwise negative.    Physical Exam    VS:  BP (!) 143/82 (BP Location: Left Arm, Patient Position: Sitting, Cuff Size: Normal)   Pulse 70   Ht 6' (1.829 m)   Wt 241 lb (109.3 kg)   BMI 32.69 kg/m  BMI: Body mass index is 32.69 kg/m.  Wt Readings from Last 3 Encounters:  08/27/23 241 lb (109.3 kg)  06/04/23 240 lb (108.9 kg)  06/02/23 240 lb (108.9 kg)     GEN- The patient is well appearing, alert and  oriented x 3 today. Facial asymmetry  Lungs- Clear to ausculation bilaterally, normal work of breathing.  Heart- Irregularly irregular rate and rhythm, no murmurs, rubs or gallops Extremities- No peripheral edema, warm, dry    Studies Reviewed   Previous EP, cardiology notes.    EKG is ordered. Personal review of EKG from today shows:    EKG Interpretation Date/Time:  Thursday August 27 2023 14:01:10 EST Ventricular Rate:  70 PR Interval:  148 QRS Duration:  90 QT Interval:  392 QTC Calculation: 423 R Axis:   5  Text Interpretation: Sinus rhythm with Blocked Premature atrial complexes Confirmed by Atiya Yera [phone]) on 08/27/2023 2:05:07 PM    Long Term monitor, 12/18/2022 Patient had a min HR of 38 bpm, max HR of 250 bpm, and avg HR of 70 bpm. Predominant underlying rhythm was Sinus Rhythm. Slight P wave morphology changes were noted. 8 Ventricular Tachycardia runs occurred, the run with the fastest interval lasting 4  beats with a max rate of 250 bpm, the longest lasting 9 beats with an avg rate of 118 bpm. 1983 Supraventricular Tachycardia runs occurred, the run with the fastest interval lasting 5 beats with a max rate of 211 bpm, the longest lasting 18 beats with an avg rate of 99 bpm. Some episodes of SVT may be possible Atrial Tach w/ variable block. Some episodes of SVT  may be possible short runs of AF/AFL.    Atrial Fibrillation/Flutter occurred (2% burden), ranging from 105-169 bpm (avg of 161 bpm), the longest  lasting 5 hours 53 mins with an avg rate of 162 bpm.    True duration of AF/AFL episodes difficult to ascertain due to the presence of artifact. SVT was detected within +/- 45 seconds of symptomatic patient event(s). Isolated SVEs were frequent (14.0%,  [id number]), SVE Couplets were frequent (7.3%, 41794), and SVE Triplets were occasional (3.6%, 13889).  Isolated VEs were occasional (1.4%, 15775), VE Couplets were rare (<1.0%, 453), and VE Triplets were rare (<1.0%,  9)  TTE, 03/20/2022  1. Left ventricular ejection fraction, by estimation, is 45 to 50%. Left ventricular ejection fraction by PLAX is 43 %. The left ventricle has mildly decreased function. The left ventricle demonstrates global hypokinesis. The left ventricular internal cavity size was mildly dilated. Left ventricular diastolic function could not be evaluated.   2. Right ventricular systolic function is normal. The right ventricular size is normal. There is normal pulmonary artery systolic pressure.   3. Left atrial size was severely dilated.   4. Right atrial size was severely dilated.   5. The mitral valve is normal in structure. Trivial mitral valve regurgitation. No evidence of mitral stenosis.   6. The aortic valve is tricuspid. Aortic valve regurgitation is trivial. No aortic stenosis is present.   7. Aortic dilatation noted. There is moderate dilatation of the aortic root, measuring 45 mm. There is borderline dilatation of the ascending aorta, measuring 36 mm.   8. The inferior vena cava is dilated in size with >50% respiratory variability, suggesting right atrial pressure of 8 mmHg.    Assessment and Plan     #) AFib #) aflutter #) PACs Most recently s/p afib, flutter ablation with Dr. Merlyn 05/2023 Frequent PACs on today's EKG He is very concerned about his low pulse readings on his pulse ox Will stop flecainide  at this time Reduce metoprolol  to 12.5mg  BID Continue to monitor pulse and BP at home  #) Hypercoag d/t  afib CHA2DS2-VASc Score = at least 3 [CHF History: 0, HTN History: 1, Diabetes History: 0, Stroke History: 2, Vascular Disease History: 0, Age Score: 0, Gender Score: 0].  Therefore, the patient's annual risk of stroke is 3.2 %.    Stroke ppx - 5mg  eliquis  BID, appropriately dosed No bleeding concerns   #) TIA Recommend he follow-up with neurology and PCP for ongoing evaluation and mgmt of facial and L arm numbness Continue eliquis  as above      Current  medicines are reviewed at length with the patient today.   The patient has concerns regarding his medicines.  The following changes were made today:   STOP flecainide  REDUCE lopressor  to 12.5mg  BID  Labs/ tests ordered today include:  Orders Placed This Encounter  Procedures   EKG 12-Lead     Disposition: Follow up with Dr. Cindie or EP APP in 3 months   Signed, Veeda Virgo, NP  08/27/23  3:26 PM  Electrophysiology [facility]

## 2023-08-27 ENCOUNTER — Encounter: Payer: Self-pay | Admitting: Cardiology

## 2023-08-27 ENCOUNTER — Ambulatory Visit: Payer: 59 | Attending: Cardiology | Admitting: Cardiology

## 2023-08-27 VITALS — BP 143/82 | HR 70 | Ht 72.0 in | Wt 241.0 lb

## 2023-08-27 DIAGNOSIS — I491 Atrial premature depolarization: Secondary | ICD-10-CM

## 2023-08-27 DIAGNOSIS — I4819 Other persistent atrial fibrillation: Secondary | ICD-10-CM

## 2023-08-27 DIAGNOSIS — D6869 Other thrombophilia: Secondary | ICD-10-CM | POA: Diagnosis not present

## 2023-08-27 DIAGNOSIS — G459 Transient cerebral ischemic attack, unspecified: Secondary | ICD-10-CM

## 2023-08-27 MED ORDER — APIXABAN 5 MG PO TABS: 5.0000 mg | ORAL_TABLET | Freq: Two times a day (BID) | ORAL | 3 refills | Status: DC

## 2023-08-27 MED ORDER — METOPROLOL TARTRATE 25 MG PO TABS: 12.5000 mg | ORAL_TABLET | Freq: Two times a day (BID) | ORAL | 1 refills | Status: DC

## 2023-08-27 NOTE — Patient Instructions (Addendum)
 Medication Instructions:  Your physician recommends the following medication changes.  STOP TAKING: Flecainide   DECREASE: Metoprolol  Tartrate to 12.5 mg twice daily   *If you need a refill on your cardiac medications before your next appointment, please call your pharmacy*   Lab Work: None ordered If you have labs (blood work) drawn today and your tests are completely normal, you will receive your results only by: MyChart Message (if you have MyChart) OR A paper copy in the mail If you have any lab test that is abnormal or we need to change your treatment, we will call you to review the results.   Testing/Procedures: None ordered   Follow-Up: At [facility], you and your health needs are our priority.  As part of our continuing mission to provide you with exceptional heart care, we have created designated Provider Care Teams.  These Care Teams include your primary Cardiologist (physician) and Advanced Practice Providers (APPs -  Physician Assistants and Nurse Practitioners) who all work together to provide you with the care you need, when you need it.  We recommend signing up for the patient portal called MyChart.  Sign up information is provided on this After Visit Summary.  MyChart is used to connect with patients for Virtual Visits (Telemedicine).  Patients are able to view lab/test results, encounter notes, upcoming appointments, etc.  Non-urgent messages can be sent to your provider as well.   To learn more about what you can do with MyChart, go to [email or web address].    Your next appointment:   3 month(s)  Provider:   Ole Holts, MD or Suzann Riddle, NP

## 2023-08-28 ENCOUNTER — Telehealth: Payer: Self-pay | Admitting: Cardiology

## 2023-08-28 NOTE — Telephone Encounter (Signed)
 Patient is requesting call back to discuss sleep study needing to be done.

## 2023-09-01 ENCOUNTER — Ambulatory Visit: Payer: 59 | Admitting: Pulmonary Disease

## 2023-09-01 ENCOUNTER — Ambulatory Visit: Payer: 59 | Admitting: Student

## 2023-09-04 ENCOUNTER — Telehealth: Payer: Self-pay

## 2023-09-04 NOTE — Telephone Encounter (Signed)
My chart message sent to patient stating Prior Auth for Sleep Study has been approved. Patient will receive call from [facility] to schedule Testing.

## 2023-10-16 ENCOUNTER — Ambulatory Visit (HOSPITAL_BASED_OUTPATIENT_CLINIC_OR_DEPARTMENT_OTHER): Payer: 59 | Attending: Cardiology | Admitting: Cardiology

## 2023-10-16 DIAGNOSIS — G4736 Sleep related hypoventilation in conditions classified elsewhere: Secondary | ICD-10-CM | POA: Diagnosis not present

## 2023-10-16 DIAGNOSIS — G4733 Obstructive sleep apnea (adult) (pediatric): Secondary | ICD-10-CM | POA: Diagnosis present

## 2023-10-16 DIAGNOSIS — I472 Ventricular tachycardia, unspecified: Secondary | ICD-10-CM | POA: Insufficient documentation

## 2023-10-19 NOTE — Procedures (Signed)
   [facility] [address] [place], [place] [address] Tel: [phone]   Fax: [phone]  Polysomnography Interpretation  Patient Name:  Michael, Choi Date:  10/16/2023 Referring Physician:  Quintella Reichert MD  Indications for Polysomnography The patient is a 63 year-old Male who is 6' and weighs 235.0 lbs. His BMI equals 32.2.  A full night polysomnogram was performed to evaluate for -.  Medication  amoxicilian  benedryl   Polysomnogram Data A full night polysomnogram recorded the standard physiologic parameters including EEG, EOG, EMG, EKG, nasal and oral airflow.  Respiratory parameters of chest and abdominal movements were recorded with Respiratory Inductance Plethysmography belts.  Oxygen saturation was recorded by pulse oximetry.   Sleep Architecture The total recording time of the polysomnogram was 423.0 minutes.  The total sleep time was 359.5 minutes.  The patient spent 0.3% of total sleep time in Stage N1, 83.4% in Stage N2, 3.5% in Stages N3, and 12.8% in REM.  Sleep latency was 0.1 minutes.  REM latency was 215.5 minutes.  Sleep Efficiency was 85.0%.  Wake after Sleep Onset time was 63.5 minutes.  Respiratory Events The polysomnogram revealed a presence of 0 obstructive, 0 central, and 0 mixed apneas resulting in an Apnea index of 0 events per hour.  There were 65 hypopneas (>=3% desaturation and/or arousal) resulting in an Apnea\Hypopnea Index (AHI >=3% desaturation and/or arousal) of 10.8 events per hour.  There were 52 hypopneas (>=4% desaturation) resulting in an Apnea\Hypopnea Index (AHI >=4% desaturation) of 8.7 events per hour.  There were 1 Respiratory Effort Related Arousals resulting in a RERA index of 0.2 events per hour. The Respiratory Disturbance Index is 11.0 events per hour.  The snore index was 151.9 events per hour.  Mean oxygen saturation was 93.5%.  The lowest oxygen saturation during sleep was 76.0%.  Time spent  <=88% oxygen saturation was 18.1 minutes (4.5%).  Limb Activity There were 10 total limb movements recorded, of this total, 10 were classified as PLMs.  PLM index was 1.7 per hour and PLM associated with Arousals index was 0.2 per hour.  Cardiac Summary The average pulse rate was 51.4 bpm.  The minimum pulse rate was 30.0 bpm while the maximum pulse rate was 100.0 bpm.  Cardiac rhythm was abnormal with frequent PACs, occasional PVCs and nonsustained ventricular tachycardia (10 beats)  Diagnosis:  Mild Obstructive Sleep Apnea Nocturnal Hypoxemia Nonsustained Ventricular Tachycardia  Recommendations: In lab CPAP titration for sleep disordered breathing. The patient should be counseled to avoid sleep supine. The patient should be counseled to practice good sleep hygiene. Consider ENT evaluation to rule out surgical causes of snoring.    This study was personally reviewed and electronically signed by: Michael Maceachern  MD Accredited Board Certified in Sleep Medicine Date/Time: 10/19/2023 7:53PM

## 2023-10-27 ENCOUNTER — Telehealth: Payer: Self-pay

## 2023-10-27 NOTE — Telephone Encounter (Signed)
-----   Message from Armanda Magic sent at 10/19/2023  7:56 PM EDT ----- Please let patient know that they have sleep apnea.  Recommend therapeutic CPAP titration for treatment of patient's sleep disordered breathing.

## 2023-10-27 NOTE — Telephone Encounter (Signed)
 Called patient to give sleep study results and recommendations. No VM set up.

## 2023-10-28 ENCOUNTER — Encounter: Payer: Self-pay | Admitting: Cardiology

## 2023-10-28 DIAGNOSIS — I5022 Chronic systolic (congestive) heart failure: Secondary | ICD-10-CM

## 2023-10-28 DIAGNOSIS — G4733 Obstructive sleep apnea (adult) (pediatric): Secondary | ICD-10-CM

## 2023-11-07 ENCOUNTER — Other Ambulatory Visit: Payer: Self-pay | Admitting: Internal Medicine

## 2023-11-20 NOTE — Telephone Encounter (Signed)
 The patient has been notified of the result and verbalized understanding.  All questions (if any) were answered. Gaylene Kays, CMA 11/20/2023 5:59 PM    Will precert titration

## 2023-11-25 ENCOUNTER — Ambulatory Visit: Payer: 59 | Attending: Cardiology | Admitting: Cardiology

## 2023-11-25 ENCOUNTER — Ambulatory Visit

## 2023-11-25 VITALS — BP 140/80 | HR 63 | Ht 72.0 in | Wt 244.8 lb

## 2023-11-25 DIAGNOSIS — I4819 Other persistent atrial fibrillation: Secondary | ICD-10-CM

## 2023-11-25 DIAGNOSIS — I491 Atrial premature depolarization: Secondary | ICD-10-CM

## 2023-11-25 DIAGNOSIS — I4892 Unspecified atrial flutter: Secondary | ICD-10-CM | POA: Diagnosis not present

## 2023-11-25 DIAGNOSIS — G4733 Obstructive sleep apnea (adult) (pediatric): Secondary | ICD-10-CM

## 2023-11-25 DIAGNOSIS — D6869 Other thrombophilia: Secondary | ICD-10-CM

## 2023-11-25 NOTE — Patient Instructions (Signed)
 Medication Instructions:  The current medical regimen is effective;  continue present plan and medications as directed. Please refer to the Current Medication list given to you today.   *If you need a refill on your cardiac medications before your next appointment, please call your pharmacy*  Testing/Procedures:  ZIO XT- Long Term Monitor Instructions  Your physician has requested you wear a ZIO patch monitor for 14 days.  This is a single patch monitor. [employer] supplies one patch monitor per enrollment. Additional stickers are not available. Please do not apply patch if you will be having a Nuclear Stress Test,  Echocardiogram, Cardiac CT, MRI, or Chest Xray during the period you would be wearing the  monitor. The patch cannot be worn during these tests. You cannot remove and re-apply the  ZIO XT patch monitor.  Your ZIO patch monitor will be mailed 3 day USPS to your address on file. It may take 3-5 days  to receive your monitor after you have been enrolled.  Once you have received your monitor, please review the enclosed instructions. Your monitor  has already been registered assigning a specific monitor serial # to you.  Billing and Patient Assistance Program Information  We have supplied [employer] with any of your insurance information on file for billing purposes. [employer] offers a sliding scale Patient Assistance Program for patients that do not have  insurance, or whose insurance does not completely cover the cost of the ZIO monitor.  You must apply for the Patient Assistance Program to qualify for this discounted rate.  To apply, please call [employer] at [phone], select option 4, select option 2, ask to apply for  Patient Assistance Program. Sanna Crystal will ask your household income, and how many people  are in your household. They will quote your out-of-pocket cost based on that information.  [employer] will also be able to set up a 33-month, interest-free payment plan if  needed.  Applying the monitor   Shave hair from upper left chest.  Hold abrader disc by orange tab. Rub abrader in 40 strokes over the upper left chest as  indicated in your monitor instructions.  Clean area with 4 enclosed alcohol pads. Let dry.  Apply patch as indicated in monitor instructions. Patch will be placed under collarbone on left  side of chest with arrow pointing upward.  Rub patch adhesive wings for 2 minutes. Remove white label marked "1". Remove the white  label marked "2". Rub patch adhesive wings for 2 additional minutes.  While looking in a mirror, press and release button in center of patch. A small green light will  flash 3-4 times. This will be your only indicator that the monitor has been turned on.  Do not shower for the first 24 hours. You may shower after the first 24 hours.  Press the button if you feel a symptom. You will hear a small click. Record Date, Time and  Symptom in the Patient Logbook.  When you are ready to remove the patch, follow instructions on the last 2 pages of Patient  Logbook. Stick patch monitor onto the last page of Patient Logbook.  Place Patient Logbook in the blue and white box. Use locking tab on box and tape box closed  securely. The blue and white box has prepaid postage on it. Please place it in the mailbox as  soon as possible. Your physician should have your test results approximately 7 days after the  monitor has been mailed back to [facility].  Call [employer] Customer Care at [phone] if you have questions regarding  your ZIO XT patch monitor. Call them immediately if you see an orange light blinking on your  monitor.  If your monitor falls off in less than 4 days, contact our Monitor department at [phone].  If your monitor becomes loose or falls off after 4 days call [employer] at [phone] for  suggestions on securing your monitor   Follow-Up: At [facility], you and your health needs are our  priority.  As part of our continuing mission to provide you with exceptional heart care, our providers are all part of one team.  This team includes your primary Cardiologist (physician) and Advanced Practice Providers or APPs (Physician Assistants and Nurse Practitioners) who all work together to provide you with the care you need, when you need it.  Your next appointment:   6 month(s)  Provider:   Harvie Liner, MD or Suzann Riddle, NP    We recommend signing up for the patient portal called "MyChart".  Sign up information is provided on this After Visit Summary.  MyChart is used to connect with patients for Virtual Visits (Telemedicine).  Patients are able to view lab/test results, encounter notes, upcoming appointments, etc.  Non-urgent messages can be sent to your provider as well.   To learn more about what you can do with MyChart, go to [email or web address].

## 2023-11-25 NOTE — Progress Notes (Signed)
 Electrophysiology Clinic Note    Date:  11/25/2023  Patient ID:  Tason, Larmore 03-19-1961, MRN [id number] PCP:  [facility]  Cardiologist:  None Electrophysiologist: Boyce Byes, MD    Discussed the use of AI scribe software for clinical note transcription with the patient, who gave verbal consent to proceed.   Patient Profile    Chief Complaint: bradycardia  History of Present Illness: CHERIF VEREB is a 63 y.o. male with PMH notable for aflutter, persis afib, HTN, OSA; seen today for Boyce Byes, MD for routine EP follow-up.   He is s/p aflutter ablation 2019 S/p AFib ablation 2022 by Dr. Nunzio Belch S/p redo ablation 04/2023 with PVI, posterior wall, as well as two atypical atrial flutter lines by Dr. Marven Slimmer.  I saw him 08/2023 after he went to ER with facial, shoulder, arm numbness. While in the ER, he had abnormal rhythm on tele and was told to restart his flecainide . He then called clinic concerned about bradycardia > flec was stopped.   On follow-up today, he continues to have paroxysmal AF episodes several times a week. He feels like his heart is beating fast, but when he checks his pulse it is actually slow. He continues to have fatigue and DOE, especially when climbing stairs at work.  He continues to take eliquis  BID, no bleeding concerns.  He was recently diagnosed with moderate sleep apnea, is waiting for insurance to approve CPAP.    AAD History: Flecainide , stopped 08/2023    ROS:  Please see the history of present illness. All other systems are reviewed and otherwise negative.    Physical Exam    VS:  BP (!) 140/80 (BP Location: Left Arm, Patient Position: Sitting, Cuff Size: Large)   Pulse 63   Ht 6' (1.829 m)   Wt 244 lb 12.8 oz (111 kg)   SpO2 94%   BMI 33.20 kg/m  BMI: Body mass index is 33.2 kg/m.  Wt Readings from Last 3 Encounters:  11/25/23 244 lb 12.8 oz (111 kg)  10/16/23 235 lb (106.6 kg)  08/27/23 241 lb  (109.3 kg)     GEN- The patient is well appearing, alert and oriented x 3 today.  Lungs- Clear to ausculation bilaterally, normal work of breathing.  Heart- Regular rate and rhythm with ectopy, no murmurs, rubs or gallops Extremities- No peripheral edema, warm, dry    Studies Reviewed   Previous EP, cardiology notes.    EKG is ordered. Personal review of EKG from today shows:    EKG Interpretation Date/Time:  Wednesday Nov 25 2023 14:18:01 EDT Ventricular Rate:  63 PR Interval:  156 QRS Duration:  92 QT Interval:  394 QTC Calculation: 403 R Axis:   26  Text Interpretation: Sinus rhythm with Premature atrial complexes When compared with ECG of 27-Aug-2023 14:01, No significant change was found Confirmed by Lety Cullens [phone]) on 11/25/2023 2:21:31 PM    Long Term monitor, 12/18/2022 Patient had a min HR of 38 bpm, max HR of 250 bpm, and avg HR of 70 bpm. Predominant underlying rhythm was Sinus Rhythm. Slight P wave morphology changes were noted. 8 Ventricular Tachycardia runs occurred, the run with the fastest interval lasting 4  beats with a max rate of 250 bpm, the longest lasting 9 beats with an avg rate of 118 bpm. 1983 Supraventricular Tachycardia runs occurred, the run with the fastest interval lasting 5 beats with a max rate of 211 bpm, the  longest lasting 18 beats with an avg rate of 99 bpm. Some episodes of SVT may be possible Atrial Tach w/ variable block. Some episodes of SVT may be possible short runs of AF/AFL.    Atrial Fibrillation/Flutter occurred (2% burden), ranging from 105-169 bpm (avg of 161 bpm), the longest  lasting 5 hours 53 mins with an avg rate of 162 bpm.    True duration of AF/AFL episodes difficult to ascertain due to the presence of artifact. SVT was detected within +/- 45 seconds of symptomatic patient event(s). Isolated SVEs were frequent (14.0%,  [id number]), SVE Couplets were frequent (7.3%, [id number]), and SVE Triplets were occasional (3.6%, 13889).   Isolated VEs were occasional (1.4%, 15775), VE Couplets were rare (<1.0%, 453), and VE Triplets were rare (<1.0%, 9)  TTE, 03/20/2022  1. Left ventricular ejection fraction, by estimation, is 45 to 50%. Left ventricular ejection fraction by PLAX is 43 %. The left ventricle has mildly decreased function. The left ventricle demonstrates global hypokinesis. The left ventricular internal cavity size was mildly dilated. Left ventricular diastolic function could not be evaluated.   2. Right ventricular systolic function is normal. The right ventricular size is normal. There is normal pulmonary artery systolic pressure.   3. Left atrial size was severely dilated.   4. Right atrial size was severely dilated.   5. The mitral valve is normal in structure. Trivial mitral valve regurgitation. No evidence of mitral stenosis.   6. The aortic valve is tricuspid. Aortic valve regurgitation is trivial. No aortic stenosis is present.   7. Aortic dilatation noted. There is moderate dilatation of the aortic root, measuring 45 mm. There is borderline dilatation of the ascending aorta, measuring 36 mm.   8. The inferior vena cava is dilated in size with >50% respiratory variability, suggesting right atrial pressure of 8 mmHg.    Assessment and Plan     #) AFib #) aflutter #) PACs #) palpitations S/p redo afib, flutter ablation with Dr. Arturo Late 05/2023 Continues to have PACs on EKG Continue 37.5mg  lopressor  BID Update monitor to confirm afib/flutter burden Consider updated TTE   #) Hypercoag d/t  afib CHA2DS2-VASc Score = at least 3 [CHF History: 0, HTN History: 1, Diabetes History: 0, Stroke History: 2, Vascular Disease History: 0, Age Score: 0, Gender Score: 0].  Therefore, the patient's annual risk of stroke is 3.2 %.    Stroke ppx - 5mg  eliquis  BID, appropriately dosed No bleeding concerns   #) OSA Encouraged CPAP usage once he obtains the device     Current medicines are reviewed at length with  the patient today.   The patient does not have concerns regarding his medicines.  The following changes were made today:   none  Labs/ tests ordered today include:  Orders Placed This Encounter  Procedures   LONG TERM MONITOR (3-14 DAYS)   EKG 12-Lead     Disposition: Follow up with Dr. Marven Slimmer or EP APP in 6 months, sooner if needed   Signed, Rashmi Tallent, NP  11/25/23  3:42 PM  Electrophysiology [facility]

## 2023-12-01 NOTE — Telephone Encounter (Signed)
 Titration has pended.

## 2023-12-09 NOTE — Telephone Encounter (Signed)
 Prior Authorization for TITRATION sent to [facility] via web portal. Tracking Number . CANCELLED;DENIED: AUTH# [id number]

## 2023-12-11 ENCOUNTER — Other Ambulatory Visit: Payer: Self-pay | Admitting: Internal Medicine

## 2023-12-15 NOTE — Addendum Note (Signed)
 Addended by: Joslyn Nim on: 12/15/2023 05:49 PM   Modules accepted: Orders

## 2024-01-08 ENCOUNTER — Telehealth: Payer: Self-pay | Admitting: Cardiology

## 2024-01-08 NOTE — Telephone Encounter (Signed)
 Call with abnormal EKG reading

## 2024-01-08 NOTE — Telephone Encounter (Signed)
 Dominique with [employer] calling to report end of summary report findings   Symptomatic bradycardia occurred 12/08/23 at 309 am on page 7 strip 9. He also had 213 episodes of SVT and 2 episodes of VT

## 2024-01-11 DIAGNOSIS — I4819 Other persistent atrial fibrillation: Secondary | ICD-10-CM | POA: Diagnosis not present

## 2024-01-21 ENCOUNTER — Ambulatory Visit: Payer: Self-pay | Admitting: Cardiology

## 2024-01-28 ENCOUNTER — Telehealth: Payer: Self-pay

## 2024-01-28 DIAGNOSIS — G4733 Obstructive sleep apnea (adult) (pediatric): Secondary | ICD-10-CM

## 2024-01-28 NOTE — Telephone Encounter (Signed)
 ONO ordered, patient instructed via [facility]  to perform ONO on cpap.

## 2024-01-29 ENCOUNTER — Telehealth: Payer: Self-pay

## 2024-01-29 NOTE — Addendum Note (Signed)
 Addended by: BEVELY CONNELL BROCKS on: 01/29/2024 11:07 AM   Modules accepted: Orders

## 2024-01-29 NOTE — Telephone Encounter (Signed)
 Order for Overnight Oximetry test sent to [facility] health today.

## 2024-02-03 ENCOUNTER — Ambulatory Visit: Admitting: Cardiology

## 2024-02-05 ENCOUNTER — Ambulatory Visit: Admitting: Cardiology

## 2024-02-05 ENCOUNTER — Telehealth: Payer: Self-pay

## 2024-02-05 NOTE — Telephone Encounter (Signed)
 I called patient, he was scheduled to see Chantal Needle, NP at 9:30 AM. However NP recommended he see MD due to issues with lower heart rate, she would like for him to see MD to discuss further plan. Offered appointment with MD for next week, patient states he would call back and let me know due to his job he has been out of work and he can not get into trouble with his job. He was advised to call back so we can get him scheduled.  Patient verbalized understanding, cancelled appointment for this morning.

## 2024-02-09 ENCOUNTER — Other Ambulatory Visit: Payer: Self-pay | Admitting: Internal Medicine

## 2024-02-10 ENCOUNTER — Encounter: Payer: Self-pay | Admitting: Cardiology

## 2024-02-10 ENCOUNTER — Ambulatory Visit: Attending: Cardiology | Admitting: Cardiology

## 2024-02-10 VITALS — BP 130/62 | HR 55 | Ht 72.0 in | Wt 149.8 lb

## 2024-02-10 DIAGNOSIS — I4892 Unspecified atrial flutter: Secondary | ICD-10-CM

## 2024-02-10 DIAGNOSIS — G4733 Obstructive sleep apnea (adult) (pediatric): Secondary | ICD-10-CM

## 2024-02-10 DIAGNOSIS — I4819 Other persistent atrial fibrillation: Secondary | ICD-10-CM

## 2024-02-10 MED ORDER — METOPROLOL TARTRATE 25 MG PO TABS: 25.0000 mg | ORAL_TABLET | Freq: Two times a day (BID) | ORAL | 3 refills | Status: DC

## 2024-02-10 NOTE — Progress Notes (Signed)
  Electrophysiology Office Follow up Visit Note:    Date:  02/10/2024   ID:  Michael Choi, DOB 06/22/1961, MRN [id number]  PCP:  [facility]  [facility] Cardiologist:  None  [facility] Electrophysiologist:  Michael ONEIDA HOLTS, MD    Interval History:     Michael Choi is a 63 y.o. male who presents for a follow up visit.   I last saw the patient January 07, 2023 for his persistent atrial fibrillation and flutter.  He was on flecainide  in the past.  He had a catheter ablation in February 2022 with Dr. Kelsie.  He had a redo catheter ablation May 18, 2023 with me.  During that procedure the left pulmonary veins were reisolated.  The posterior wall was ablated.  There were multiple flutter circuits including appearing atrial flutter also targeted during that catheter ablation.  He currently takes Eliquis  twice daily for stroke prophylaxis and metoprolol  tartrate 25 mg by mouth twice daily.       Past medical, surgical, social and family history were reviewed.  ROS:   Please see the history of present illness.    All other systems reviewed and are negative.  EKGs/Labs/Other Studies Reviewed:    The following studies were reviewed today:  January 11, 2024 ZIO monitor personally reviewed HR 38 to 185, average 66. 2 NSVT, longest 8 beats. 213 nonsustained SVT episodes, longest 18 seconds with an average rate of 131 bpm. Frequent supraventricular ectopy, 16.7% Occasional ventricular ectopy, 1.7% No sustained arrhythmias. No atrial fibrillation. Symptom trigger episodes correspond to sinus with PAC, SVT   Nov 25, 2023 EKG shows sinus rhythm.  PAC.       Physical Exam:    VS:  BP 130/62 (BP Location: Left Arm, Patient Position: Sitting, Cuff Size: Normal)   Pulse (!) 55   Ht 6' (1.829 m)   Wt 149 lb 12.8 oz (67.9 kg)   SpO2 95%   BMI 20.32 kg/m     Wt Readings from Last 3 Encounters:  02/10/24 149 lb 12.8 oz (67.9 kg)  11/25/23 244 lb 12.8 oz (111  kg)  10/16/23 235 lb (106.6 kg)     GEN: no distress CARD: RRR, No MRG RESP: No IWOB. CTAB.      ASSESSMENT:    1. Persistent atrial fibrillation (HCC)   2. Atrial flutter, unspecified type (HCC)    PLAN:    In order of problems listed above:  #AF #AFL Maintaining sinus. Continueu eliquis . Cont metoprolol  25mg  PO BID  Will refer to PCP  #OSA Concerned he may have deviated septum contributing. Will refer to ENT.  Encouraged aerobic exercise.  Follow up 6 months.  Signed, Michael HOLTS, MD, [facility], [facility] 02/10/2024 3:51 PM    Electrophysiology [facility]

## 2024-02-10 NOTE — Patient Instructions (Signed)
 Medication Instructions:  Your physician recommends that you continue on your current medications as directed. Please refer to the Current Medication list given to you today.  *If you need a refill on your cardiac medications before your next appointment, please call your pharmacy*  Follow-Up: At [facility], you and your health needs are our priority.  As part of our continuing mission to provide you with exceptional heart care, our providers are all part of one team.  This team includes your primary Cardiologist (physician) and Advanced Practice Providers or APPs (Physician Assistants and Nurse Practitioners) who all work together to provide you with the care you need, when you need it.  Your next appointment:   6 months  Provider:   You may see OLE ONEIDA HOLTS, MD or one of the following Advanced Practice Providers on your designated Care Team:   Charlies Arthur, PA-C Michael Andy Tillery, PA-C Suzann Riddle, NP Daphne Barrack, NP    You have been referred to see an ENT.   To find a primary care provider go onto [email or web address]

## 2024-03-09 ENCOUNTER — Ambulatory Visit: Payer: Self-pay | Admitting: *Deleted

## 2024-03-09 DIAGNOSIS — G4733 Obstructive sleep apnea (adult) (pediatric): Secondary | ICD-10-CM

## 2024-03-09 NOTE — Telephone Encounter (Signed)
 The patient has been notified of the result via his mychart. Patient was asked to call us  back with any questions.Joshua Dalton Seip, CMA 03/09/2024 9:54 AM     Upon patient request DME selection is [facility]  Order sent to [facility] -order O2 at 1 L via CPAP nightly with repeat Anderson on CPAP and O2

## 2024-03-09 NOTE — Telephone Encounter (Signed)
-----   Message from Wilbert Bihari sent at 02/15/2024  1:42 PM EDT ----- Please get a download to make sure his apneas are controlled and if they are controlled then we will need to order O2 at 1 L via CPAP nightly with repeat Anderson on CPAP and O2 ----- Message ----- From: Ledora Kathi CROME Sent: 02/15/2024   1:02 PM EDT To: Wilbert JONELLE Bihari, MD

## 2024-03-12 ENCOUNTER — Other Ambulatory Visit: Payer: Self-pay | Admitting: Cardiology

## 2024-03-12 DIAGNOSIS — I4819 Other persistent atrial fibrillation: Secondary | ICD-10-CM

## 2024-03-30 ENCOUNTER — Ambulatory Visit: Payer: Self-pay | Admitting: Cardiology

## 2024-03-30 DIAGNOSIS — G4733 Obstructive sleep apnea (adult) (pediatric): Secondary | ICD-10-CM

## 2024-04-08 ENCOUNTER — Other Ambulatory Visit (HOSPITAL_COMMUNITY): Payer: Self-pay | Admitting: Internal Medicine

## 2024-05-05 NOTE — Addendum Note (Signed)
 Addended by: JOSHUA DALTON MATSU on: 05/05/2024 03:56 PM   Modules accepted: Orders

## 2024-05-05 NOTE — Addendum Note (Signed)
 Addended by: JOSHUA DALTON MATSU on: 05/05/2024 03:04 PM   Modules accepted: Orders

## 2024-05-05 NOTE — Telephone Encounter (Addendum)
Order placed to [place] via community message

## 2024-06-02 NOTE — Telephone Encounter (Signed)
 Hello Dr Shlomo, I confirmed with [facility]) that his insurance approved his oxygen  as long as the doctor sends the prescription.

## 2024-06-13 ENCOUNTER — Other Ambulatory Visit: Payer: Self-pay

## 2024-06-13 DIAGNOSIS — R0902 Hypoxemia: Secondary | ICD-10-CM

## 2024-06-29 ENCOUNTER — Ambulatory Visit: Admitting: Cardiology

## 2024-07-06 ENCOUNTER — Ambulatory Visit: Admitting: Cardiology

## 2024-08-09 ENCOUNTER — Encounter: Payer: Self-pay | Admitting: Pulmonary Disease

## 2024-08-09 ENCOUNTER — Ambulatory Visit: Attending: Pulmonary Disease | Admitting: Pulmonary Disease

## 2024-08-09 VITALS — BP 128/80 | HR 89 | Ht 72.0 in | Wt 249.0 lb

## 2024-08-09 DIAGNOSIS — D6869 Other thrombophilia: Secondary | ICD-10-CM | POA: Diagnosis not present

## 2024-08-09 DIAGNOSIS — I4819 Other persistent atrial fibrillation: Secondary | ICD-10-CM

## 2024-08-09 DIAGNOSIS — I4892 Unspecified atrial flutter: Secondary | ICD-10-CM

## 2024-08-09 MED ORDER — METOPROLOL TARTRATE 25 MG PO TABS: 25.0000 mg | ORAL_TABLET | Freq: Two times a day (BID) | ORAL | 3 refills | Status: AC

## 2024-08-09 NOTE — Progress Notes (Signed)
" °  Electrophysiology Office Note:   Date:  08/09/2024  ID:  Michael Choi, DOB [birth year], MRN [id number]  Primary Cardiologist: None Primary Heart Failure: None Electrophysiologist: Eulas FORBES Furbish, MD      History of Present Illness:   Michael Choi is a 64 y.o. male with h/o AF, AFL seen today for routine electrophysiology followup.   Since last being seen in our clinic the patient reports doing well overall. 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  He reports he has to get phlebotomy for elevated hematocrit.   He denies chest pain, palpitations, dyspnea, PND, orthopnea, nausea, vomiting, dizziness, syncope, edema, weight gain, or early satiety.   Review of systems complete and found to be negative unless listed in HPI.   EP Information / Studies Reviewed:    EKG is ordered today. Personal review as below.  EKG Interpretation Date/Time:  Tuesday August 09 2024 15:34:47 EST Ventricular Rate:  89 PR Interval:  160 QRS Duration:  96 QT Interval:  348 QTC Calculation: 423 R Axis:   66  Text Interpretation: Sinus rhythm with Premature atrial complexes Confirmed by Aniceto Jarvis ([id number]) on 08/09/2024 3:44:14 PM    Arrhythmia / AAD / Pertinent EP Studies AF  AFL  Flecainide   EPS 08/2020 > AF on presentation, PVI ablation with RF, posterior wall ablation  EPS 05/18/23 > redo PVI with reisolation of the left pulmonary veins, ablation of PW, ablation of atypical atrial flutter Cardiac Monitor 12/2023 > HR 38-185, ave 66 bpm, 2 NSVT with longest 8 beats, 213 NSSVT episodes, frequent PAC's / occ PVC's, no AF   Physical Exam:   VS:  BP 128/80   Pulse 89   Ht 6' (1.829 m)   Wt 249 lb (112.9 kg)   SpO2 97%   BMI 33.77 kg/m    Wt Readings from Last 3 Encounters:  08/09/24 249 lb (112.9 kg)  02/10/24 149 lb 12.8 oz (67.9 kg)  11/25/23 244 lb 12.8 oz (111 kg)     GEN: Well nourished, well developed in no acute distress NECK: No JVD; No  carotid bruits CARDIAC: Regular rate and rhythm with occasional ectopy, no murmurs, rubs, gallops RESPIRATORY:  Clear to auscultation without rales, wheezing or rhonchi  ABDOMEN: Soft, non-tender, non-distended EXTREMITIES:  No edema; No deformity   Risk Assessment/Calculations:    CHA2DS2-VASc Score = 3   This indicates a 3.2% annual risk of stroke. The patient's score is based upon: CHF History: 0 HTN History: 1 Diabetes History: 0 Stroke History: 2 Vascular Disease History: 0 Age Score: 0 Gender Score: 0      ASSESSMENT AND PLAN:    Persistent Atrial Fibrillation  Atrial Flutter  CHA2DS2-VASc 3, s/p PVI + CTI ablation  -OAC for stroke prophylaxis  -metoprolol  tartrate 25 mg BID    Secondary Hypercoagulable State  -continue Eliquis  5mg  BID, dose reviewed and appropriate by age / wt   Suspected OSA  -follows with Dr. Shlomo -pt was unable to tolerate CPAP mask >    Follow up with EP APP 9 months . Transition to new EP MD (Dr. Furbish) reviewed with patient  Signed, Jarvis Aniceto, NP-C, AGACNP-BC [facility] - Electrophysiology  08/09/2024, 4:11 PM  "

## 2024-08-09 NOTE — Patient Instructions (Signed)
 Medication Instructions:  Your physician recommends that you continue on your current medications as directed. Please refer to the Current Medication list given to you today.  *If you need a refill on your cardiac medications before your next appointment, please call your pharmacy*  Lab Work: None ordered If you have labs (blood work) drawn today and your tests are completely normal, you will receive your results only by: MyChart Message (if you have MyChart) OR A paper copy in the mail If you have any lab test that is abnormal or we need to change your treatment, we will call you to review the results.  Follow-Up: At [facility], you and your health needs are our priority.  As part of our continuing mission to provide you with exceptional heart care, our providers are all part of one team.  This team includes your primary Cardiologist (physician) and Advanced Practice Providers or APPs (Physician Assistants and Nurse Practitioners) who all work together to provide you with the care you need, when you need it.  Your next appointment:   9 month(s)  Provider:   Creighton Doffing, NP
